# Patient Record
Sex: Female | Born: 1985
Health system: Southern US, Community
[De-identification: ages and names within clinical notes are randomized; demographics above are authoritative.]

## PROBLEM LIST (undated history)

## (undated) DIAGNOSIS — I499 Cardiac arrhythmia, unspecified: Secondary | ICD-10-CM

## (undated) DIAGNOSIS — C73 Malignant neoplasm of thyroid gland: Secondary | ICD-10-CM

## (undated) DIAGNOSIS — N181 Chronic kidney disease, stage 1: Secondary | ICD-10-CM

## (undated) DIAGNOSIS — E063 Autoimmune thyroiditis: Secondary | ICD-10-CM

## (undated) HISTORY — PX: WISDOM TOOTH EXTRACTION: SHX21

## (undated) HISTORY — DX: Chronic kidney disease, stage 1: N18.1

## (undated) HISTORY — DX: Autoimmune thyroiditis: E06.3

## (undated) HISTORY — DX: Malignant neoplasm of thyroid gland: C73

---
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
## 2003-06-02 ENCOUNTER — Other Ambulatory Visit: Admission: RE | Admit: 2003-06-02 | Discharge: 2003-06-02 | Payer: Self-pay | Admitting: Obstetrics and Gynecology

## 2004-08-02 ENCOUNTER — Ambulatory Visit: Payer: Self-pay | Admitting: Pulmonary Disease

## 2004-08-04 ENCOUNTER — Ambulatory Visit (HOSPITAL_COMMUNITY): Admission: RE | Admit: 2004-08-04 | Discharge: 2004-08-04 | Payer: Self-pay | Admitting: Pulmonary Disease

## 2004-08-14 ENCOUNTER — Other Ambulatory Visit: Admission: RE | Admit: 2004-08-14 | Discharge: 2004-08-14 | Payer: Self-pay | Admitting: Obstetrics and Gynecology

## 2004-08-14 ENCOUNTER — Ambulatory Visit: Payer: Self-pay | Admitting: Pulmonary Disease

## 2004-10-06 ENCOUNTER — Ambulatory Visit: Payer: Self-pay | Admitting: Pulmonary Disease

## 2004-12-18 ENCOUNTER — Ambulatory Visit: Payer: Self-pay | Admitting: Pulmonary Disease

## 2005-11-30 ENCOUNTER — Ambulatory Visit: Payer: Self-pay | Admitting: Pulmonary Disease

## 2006-04-29 ENCOUNTER — Ambulatory Visit: Payer: Self-pay | Admitting: Pulmonary Disease

## 2006-04-29 LAB — CONVERTED CEMR LAB
ALT: 37 units/L (ref 0–40)
AST: 25 units/L (ref 0–37)
Albumin: 4.1 g/dL (ref 3.5–5.2)
Alkaline Phosphatase: 56 units/L (ref 39–117)
BUN: 10 mg/dL (ref 6–23)
Basophils Absolute: 0 10*3/uL (ref 0.0–0.1)
Basophils Relative: 0.4 % (ref 0.0–1.0)
Bilirubin Urine: NEGATIVE
Bilirubin, Direct: 0.1 mg/dL (ref 0.0–0.3)
CO2: 30 meq/L (ref 19–32)
Calcium: 9.5 mg/dL (ref 8.4–10.5)
Chloride: 111 meq/L (ref 96–112)
Cholesterol: 152 mg/dL (ref 0–200)
Creatinine, Ser: 0.8 mg/dL (ref 0.4–1.2)
Crystals: NEGATIVE
Eosinophils Absolute: 0.1 10*3/uL (ref 0.0–0.6)
Eosinophils Relative: 1.5 % (ref 0.0–5.0)
Free T4: 0.6 ng/dL (ref 0.6–1.6)
GFR calc Af Amer: 115 mL/min
GFR calc non Af Amer: 95 mL/min
Glucose, Bld: 92 mg/dL (ref 70–99)
HCT: 42.8 % (ref 36.0–46.0)
HDL: 57.4 mg/dL (ref >39.0)
Hemoglobin, Urine: NEGATIVE
Hemoglobin: 14.8 g/dL (ref 12.0–15.0)
Ketones, ur: NEGATIVE mg/dL
LDL Cholesterol: 86 mg/dL (ref 0–99)
Lymphocytes Relative: 31 % (ref 12.0–46.0)
MCHC: 34.7 g/dL (ref 30.0–36.0)
MCV: 91 fL (ref 78.0–100.0)
Monocytes Absolute: 0.6 10*3/uL (ref 0.2–0.7)
Monocytes Relative: 10.9 % (ref 3.0–11.0)
Mucus, UA: NEGATIVE
Neutro Abs: 3 10*3/uL (ref 1.4–7.7)
Neutrophils Relative %: 56.2 % (ref 43.0–77.0)
Nitrite: NEGATIVE
Platelets: 228 10*3/uL (ref 150–400)
Potassium: 4.7 meq/L (ref 3.5–5.1)
RBC: 4.71 M/uL (ref 3.87–5.11)
RDW: 12.5 % (ref 11.5–14.6)
Sodium: 142 meq/L (ref 135–145)
Specific Gravity, Urine: 1.015 (ref 1.000–1.03)
TSH: 10.43 microintl units/mL — ABNORMAL HIGH (ref 0.35–5.50)
Total Bilirubin: 0.8 mg/dL (ref 0.3–1.2)
Total CHOL/HDL Ratio: 2.6
Total Protein: 6.8 g/dL (ref 6.0–8.3)
Triglycerides: 45 mg/dL (ref 0–149)
Urine Glucose: NEGATIVE mg/dL
Urobilinogen, UA: 0.2 (ref 0.0–1.0)
VLDL: 9 mg/dL (ref 0–40)
Varicella IgG: 1.9 — ABNORMAL HIGH
WBC: 5.3 10*3/uL (ref 4.5–10.5)
pH: 7 (ref 5.0–8.0)

## 2009-03-29 ENCOUNTER — Telehealth: Payer: Self-pay | Admitting: Pulmonary Disease

## 2009-05-02 ENCOUNTER — Ambulatory Visit: Payer: Self-pay | Admitting: Pulmonary Disease

## 2009-05-04 LAB — CONVERTED CEMR LAB
Albumin: 3.4 g/dL — ABNORMAL LOW (ref 3.5–5.2)
Alkaline Phosphatase: 40 units/L (ref 39–117)
Calcium: 9.1 mg/dL (ref 8.4–10.5)
Eosinophils Relative: 1.8 % (ref 0.0–5.0)
GFR calc non Af Amer: 107.81 mL/min (ref >60)
Glucose, Bld: 84 mg/dL (ref 70–99)
HCT: 36.9 % (ref 36.0–46.0)
Hemoglobin: 12.9 g/dL (ref 12.0–15.0)
LDL Cholesterol: 94 mg/dL (ref 0–99)
Lymphs Abs: 2.1 10*3/uL (ref 0.7–4.0)
MCV: 86.9 fL (ref 78.0–100.0)
Monocytes Relative: 9.4 % (ref 3.0–12.0)
Neutro Abs: 2.3 10*3/uL (ref 1.4–7.7)
RDW: 14.1 % (ref 11.5–14.6)
Sodium: 141 meq/L (ref 135–145)
TSH: 1.55 microintl units/mL (ref 0.35–5.50)
Total Bilirubin: 0.3 mg/dL (ref 0.3–1.2)
Total CHOL/HDL Ratio: 3
VLDL: 12.4 mg/dL (ref 0.0–40.0)
WBC: 5 10*3/uL (ref 4.5–10.5)

## 2009-07-25 ENCOUNTER — Telehealth: Payer: Self-pay | Admitting: Internal Medicine

## 2010-01-22 ENCOUNTER — Encounter: Payer: Self-pay | Admitting: Pulmonary Disease

## 2010-01-31 NOTE — Progress Notes (Signed)
Summary: call in doxy for uri/st - pt to call back w/ pharmacy  Phone Note Call from Patient Call back at [phone]   Caller: Patient Call For: NADEL Summary of Call: SORE THROAT CONGESTION AND RUNNY NOSE PHARMACY [facility] [address] Initial call taken by: Rickard Patience,  July 25, 2009 10:22 AM  Follow-up for Phone Call        called and spoke with pt---she stated that she is having cough/congestion with yellow/green sputum, runny nose.  NKDA--SN and TP out of the office.  please advise.  thanks Randell Loop CMA  July 25, 2009 11:13 AM  doxycycline 100 mg ac two times a day x 7 days Follow-up by: Nyoka Cowden MD,  July 25, 2009 12:52 PM  Additional Follow-up for Phone Call Additional follow up Details #1::        called spoke with patient.  advised of MW's recs as stated above.  pt verbalized her understanding.  emr does not recognize [facility] [address].  pt to call back with another identifier or pharmacy. Boone Master CNA/MA  July 25, 2009 12:56 PM     [facility] [address]   Additional Follow-up by: Eugene Gavia,  July 25, 2009 1:34 PM    Additional Follow-up for Phone Call Additional follow up Details #2::    [facility] [address] PER PTS REQUEST. Randell Loop CMA  July 25, 2009 1:48 PM   New/Updated Medications: DOXYCYCLINE HYCLATE 100 MG CAPS (DOXYCYCLINE HYCLATE) Take 1 capsule by mouth two times a day w/ meal x7days Prescriptions: DOXYCYCLINE HYCLATE 100 MG CAPS (DOXYCYCLINE HYCLATE) Take 1 capsule by mouth two times a day w/ meal x7days  #14 x 0   Entered by:   Randell Loop CMA   Authorized by:   Nyoka Cowden MD   Signed by:   Randell Loop CMA on 07/25/2009   Method used:   [facility]  [address] [phone][facility] (retail)       [address]       [place], [place]  [address]       Ph: [phone]       Fax: [phone]   RxID:   [id number]

## 2010-01-31 NOTE — Progress Notes (Signed)
Summary: rx for unithroid  Phone Note Call from Patient Call back at Work Phone [phone]   Caller: Patient Call For: Lauren Morton Reason for Call: Refill Medication, Talk to Nurse Summary of Call: pt has appt on 05/02/2009, she is out of Unithroid .  She needs a months supply called in until she can get in here on her appt. [facility] Initial call taken by: Eugene Gavia,  March 29, 2009 4:30 PM  Follow-up for Phone Call        nothing in EMR.  Requested chart.  Aundra Millet Reynolds LPN  March 29, 2009 4:47 PM   pt last saw Sn on 04/28/09 and was started on synthroid at that time. She has scheduled an appt to see SN on 05/02/09 and is requesting a refill on her current thyroid medication which is Unithroid .  Please advise if ok to send fill. I have chart if needed. Carron Curie CMA  March 30, 2009 9:20 AM   Additional Follow-up for Phone Call Additional follow up Details #1::        per SN---ok for fill the unithroid  #30  with 2 refills.  thanks Renaldo Fiddler CMA  March 30, 2009 3:12 PM   refill sent. pt aware. Carron Curie CMA  March 30, 2009 3:31 PM     New/Updated Medications: UNITHROID 125 MCG TABS (LEVOTHYROXINE SODIUM) Take 1 tablet by mouth once a day Prescriptions: UNITHROID 125 MCG TABS (LEVOTHYROXINE SODIUM) Take 1 tablet by mouth once a day  #30 x 2   Entered by:   Carron Curie CMA   Authorized by:   Michele Mcalpine MD   Signed by:   Carron Curie CMA on 03/30/2009   Method used:   [facility] [id number]       [address]       [place], [place]  [address]       Ph: [phone] or [phone]       Fax: [phone]   RxID:   [id number]

## 2010-01-31 NOTE — Assessment & Plan Note (Signed)
Summary: CPX/ MBW DR Lauren Morton OKAYED TO SEE PT   CC:  cpx--fasting today.  History of Present Illness: 25 y/o WF here for a follow up visit and CPX... she has a hx of Hashimoto's thyroiditis in 2006, and ultimately became hypothyroid requiring Synthroid replacenment & stable since then... she enjoys excellent general medical health & runs half-marathons... 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 she has no new complaints or concerns today...   Current Problems:   PHYSICAL EXAMINATION (ICD-V70.0)  HASHIMOTO'S THYROIDITIS (ICD-245.2) HYPOTHYROIDISM (ICD-244.9) - now stable on LEVOTHYROID 152mcg/d... diagnosed w/ Hashimoto's disease in 2006 w/ palp thyroid & TSH=6.14 (FreeT3 & FreeT4 were normal)... Sonar showed diffuse inhomogeneous echotexture & a single nodule  ~1.5cm right upper pole... Antithyroid antibodies were positive... f/u TSH's were normal thru 4/08 when routine testing showed TSH= 10.4 & she was started on LEVOTHYROID Rx...  ~  labs 5/11 showed TSH= 1.55, & exam w/ min palp gland, no nodule felt.    Allergies (verified): No Known Drug Allergies  Comments:  Nurse/Medical Assistant: The patient's medications and allergies were reviewed with the patient and were updated in the Medication and Allergy Lists.  Past History:  Past Medical History: HASHIMOTO'S THYROIDITIS (ICD-245.2) HYPOTHYROIDISM (ICD-244.9)  Family History: mother alive age 33  hx of breast cancer and arthritis father alive age 63 hx of DM grandfather with hx of heart disease  grandmother hx of breast cancer 1 sibling alive age 46  Social History: single no children never smoked exercises 3-4 times week caffeine use--1-2 cups daily alcohol use social  Review of Systems  The patient denies fever, chills, sweats, anorexia, fatigue, weakness, malaise, weight loss, sleep disorder, blurring, diplopia, eye irritation, eye  discharge, vision loss, eye pain, photophobia, earache, ear discharge, tinnitus, decreased hearing, nasal congestion, nosebleeds, sore throat, hoarseness, chest pain, palpitations, syncope, dyspnea on exertion, orthopnea, PND, peripheral edema, cough, dyspnea at rest, excessive sputum, hemoptysis, wheezing, pleurisy, nausea, vomiting, diarrhea, constipation, change in bowel habits, abdominal pain, melena, hematochezia, jaundice, gas/bloating, indigestion/heartburn, dysphagia, odynophagia, dysuria, hematuria, urinary frequency, urinary hesitancy, nocturia, incontinence, back pain, joint pain, joint swelling, muscle cramps, muscle weakness, stiffness, arthritis, sciatica, restless legs, leg pain at night, leg pain with exertion, rash, itching, dryness, suspicious lesions, paralysis, paresthesias, seizures, tremors, vertigo, transient blindness, frequent falls, frequent headaches, difficulty walking, depression, anxiety, memory loss, confusion, cold intolerance, heat intolerance, polydipsia, polyphagia, polyuria, unusual weight change, abnormal bruising, bleeding, enlarged lymph nodes, urticaria, allergic rash, hay fever, and recurrent infections.    Vital Signs:  Patient profile:   25 year old female Height:      71 inches Weight:      162 pounds BMI:     22.68 O2 Sat:      100 % on Room air Temp:     98.8 degrees F oral Pulse rate:   54 / minute BP sitting:   110 / 76  (right arm) Cuff size:   regular  Vitals Entered By: Lauren Morton CMA (May 02, 2009 9:14 AM)  O2 Sat at Rest %:  100 O2 Flow:  Room air CC: cpx--fasting today Is Patient Diabetic? No Pain Assessment Patient in pain? no      Comments MEDS UPDATED TODAY   Physical Exam  Additional Exam:  WD, tall/ slender, 25 y/o WF in NAD... GENERAL:  Alert & oriented; pleasant & cooperative... HEENT:  [place]/AT, EOM-wnl, PERRLA, Fundi-benign, EACs-clear, TMs-wnl,  NOSE-clear, THROAT-clear & wnl. NECK:  Supple w/ full ROM; no JVD; normal  carotid impulses w/o bruits; no thyromegaly or nodules palpated; no lymphadenopathy. CHEST:  Clear to P & A; without wheezes/ rales/ or rhonchi. HEART:  Regular Rhythm; without murmurs/ rubs/ or gallops. ABDOMEN:  Soft & nontender; normal bowel sounds; no organomegaly or masses detected. EXT: without deformities or arthritic changes; no varicose veins/ venous insuffic/ or edema. NEURO:  CN's intact; motor testing normal; sensory testing normal; gait normal & balance OK. DERM:  No lesions noted; no rash etc...     CXR  Procedure date:  05/02/2009  Findings:      CHEST - 2 VIEW Comparison: Chest x-ray of 04/29/2006   Findings: The lungs are clear.  Mediastinal contours are stable. The heart is within normal limits in size.  No bony abnormality is seen.   IMPRESSION: No active lung disease.   Read By:  Lauren Morton,  M.D.   EKG  Procedure date:  05/02/2009  Findings:      Sinus bradycardia with rate of:  56/ min... Tracing is WNL, NAD...  SN   MISC. Report  Procedure date:  05/02/2009  Findings:      BMP (METABOL)   Sodium                    141 mEq/L                   135-145   Potassium                 4.3 mEq/L                   3.5-5.1   Chloride                  107 mEq/L                   96-112   Carbon Dioxide            29 mEq/L                    19-32   Glucose                   84 mg/dL                    86-57   BUN                  [L]  5 mg/dL                     8-46   Creatinine                0.7 mg/dL                   9.6-2.9   Calcium                   9.1 mg/dL                   5.2-84.1   GFR                       107.81 mL/min               >60  Hepatic/Liver Function Panel (HEPATIC)   Total Bilirubin  0.3 mg/dL                   0.9-8.1   Direct Bilirubin          0.1 mg/dL                   1.9-1.4   Alkaline Phosphatase      40 U/L                      39-117   AST                       21 U/L                      0-37   ALT                        21 U/L                      0-35   Total Protein             6.1 g/dL                    7.8-2.9   Albumin              [L]  3.4 g/dL                    5.6-2.1  CBC Platelet w/Diff (CBCD)   White Cell Count          5.0 K/uL                    4.5-10.5   Red Cell Count            4.25 Mil/uL                 3.87-5.11   Hemoglobin                12.9 g/dL                   30.8-65.7   Hematocrit                36.9 %                      36.0-46.0   MCV                       86.9 fl                     78.0-100.0   Platelet Count            246.0 K/uL                  150.0-400.0   Neutrophil %              46.2 %                      43.0-77.0   Lymphocyte %              42.2 %                      12.0-46.0   Monocyte %  9.4 %                       3.0-12.0   Eosinophils%              1.8 %                       0.0-5.0   Basophils %               0.4 %                       0.0-3.0  Comments:      Lipid Panel (LIPID)   Cholesterol               165 mg/dL                   0-981   Triglycerides             62.0 mg/dL                  1.9-147.8   HDL                       29.56 mg/dL                 >21.30   LDL Cholesterol           94 mg/dL                    8-65  Tests: (5) TSH (TSH)   FastTSH                   1.55 uIU/mL                 0.35-5.50   Impression & Recommendations:  Problem # 1:  PHYSICAL EXAMINATION (ICD-V70.0) Good general health... Orders: 12 Lead EKG (12 Lead EKG) TLB-BMP (Basic Metabolic Panel-BMET) (80048-METABOL) TLB-Hepatic/Liver Function Pnl (80076-HEPATIC) TLB-CBC Platelet - w/Differential (85025-CBCD) TLB-Lipid Panel (80061-LIPID) TLB-TSH (Thyroid Stimulating Hormone) (84443-TSH)  Problem # 2:  HYPOTHYROIDISM (ICD-244.9) Stable on Levothy 125... continue same. Her updated medication list for this problem includes:    Unithroid 125 Mcg Tabs (Levothyroxine sodium) .Marland Kitchen... Take 1 tablet by mouth once a  day  Orders: Prescription Created Electronically [phone])  Complete Medication List: 1)  Unithroid 125 Mcg Tabs (Levothyroxine sodium) .... Take 1 tablet by mouth once a day 2)  Ortho-cyclen (28) 0.25-35 Mg-mcg Tabs (Norgestimate-eth estradiol) .... Take 1 tablet by mouth once a day 3)  Womens Multivitamin Plus Tabs (Multiple vitamins-minerals) .... Take 1 tab by mouth once daily...  Other Orders: T-2 View CXR (71020TC)  Patient Instructions: 1)  Today we updated your med list- see below.... 2)  We wrote new perscriptions for 2011... 3)  Today we did your follow up CXR, EKG, & Fasting blood work... please call the "phone tree" in a few days for your lab results.Marland KitchenMarland Kitchen 4)  Call for any problems... Prescriptions: UNITHROID 125 MCG TABS (LEVOTHYROXINE SODIUM) Take 1 tablet by mouth once a day  #100 x prn   Entered and Authorized by:   Michele Mcalpine MD   Signed by:   Michele Mcalpine MD on 05/02/2009   Method used:   Print then Give to Patient   RxID:   [id number]    CardioPerfect ECG  ID: [id number] Patient: Lauren Morton DOB: [birth year] Age: 25  Years Old Sex: Female Race: White Physician: Elesia Pemberton Technician: Lauren Morton CMA Height: 71 Weight: 162 Status: Unconfirmed Recorded: 05/02/2009 09:30 AM P/PR: 85 ms / 123 ms - Heart rate (maximum exercise) QRS: 85 QT/QTc/QTd: 429 ms / 420 ms / 75 ms - Heart rate (maximum exercise)  P/QRS/T axis: -15 deg / 73 deg / 45 deg - Heart rate (maximum exercise)  Heartrate: 55 bpm  Interpretation:  Sinus bradycardia with rate of:  56/ min... Tracing is WNL, NAD...  SN     Immunization History:  Influenza Immunization History:    Influenza:  historical (12/21/2008)

## 2010-05-19 NOTE — Assessment & Plan Note (Signed)
[facility]                             PULMONARY OFFICE NOTE   Lauren Morton, WRAGG                     MRN:          [id number]  DATE:11/30/2005                            DOB:          1985-06-03    HISTORY OF PRESENT ILLNESS:  The patient is a 25 year old white female  patient of Dr. Kriste Basque, who presents for an acute office visit.  The  patient complains of a one-week history of nasal congestion, postnasal  drip.  The patient denies any purulent sputum, fever, chest pain,  shortness of breath, recent travel, antibiotic use.  The patient also  complains she has had a knot along her right wrist for several months.  She denies any pain, extremity weakness or redness.   PAST MEDICAL HISTORY:  Reviewed.   CURRENT MEDICATIONS:  Reviewed.   PHYSICAL EXAMINATION:  The patient is a pleasant female, in no acute  distress.  She is afebrile with stable vital signs.  Her O2 saturation  is 100% on room air.  HEENT:  Nasal mucosa is pale, nontender sinuses to percussion,  congested, but not injected.  TM normal.  NECK:  The neck is supple without adenopathy.  No JVD.  LUNGS:  Lung sounds are clear.  CARDIAC:  Regular rate and rhythm.  ABDOMEN:  Soft, nontender.  EXTREMITIES:  Without any calf tenderness, cyanosis, clubbing or edema.  Along the dorsal aspect of the right hand at the bend of the wrist is a  small, mobile, soft nodule, consistent with a ganglion cyst.   IMPRESSION/PLAN:  1. Acute upper respiratory infection and rhinitis flare.  The patient      is to begin XYZAL daily times one week.  Samples given.  May begin      Veramyst 2 puffs twice daily.  The patient may also use Mucinex-DM      twice daily as needed.  The patient may return here if no      improvement or symptoms worsen.  2. Ganglion cyst.  The patient has been offered a referral to      orthopedist.  However, wants to      decline at present.  Has a followup with Dr. Kriste Basque in one  month,      will follow up accordingly, or sooner if needed.     Rubye Oaks, NP  Electronically Signed      Lonzo Cloud. Kriste Basque, MD  Electronically Signed   TP/MedQ  DD: 12/04/2005  DT: 12/05/2005  Job #: [id number]

## 2010-07-28 ENCOUNTER — Other Ambulatory Visit: Payer: Self-pay | Admitting: Pulmonary Disease

## 2010-09-18 ENCOUNTER — Other Ambulatory Visit: Payer: Self-pay | Admitting: Pulmonary Disease

## 2010-09-21 ENCOUNTER — Telehealth: Payer: Self-pay | Admitting: Pulmonary Disease

## 2010-09-21 MED ORDER — LEVOTHYROXINE SODIUM 125 MCG PO TABS: 125.0000 ug | ORAL_TABLET | Freq: Every day | ORAL | Status: DC

## 2010-09-21 NOTE — Telephone Encounter (Signed)
Pt is scheduled for f/u with Sn on 10/30 and knows that she must keep that appt for additional refills on her thyroid medication. We will send a 30 day supply w/ 1 additional refill to make sure she has enough medication until OV. Pt verbalized understanding of this.

## 2010-10-31 ENCOUNTER — Encounter: Payer: Self-pay | Admitting: Pulmonary Disease

## 2010-10-31 ENCOUNTER — Other Ambulatory Visit (INDEPENDENT_AMBULATORY_CARE_PROVIDER_SITE_OTHER): Payer: BC Managed Care – PPO

## 2010-10-31 ENCOUNTER — Ambulatory Visit (INDEPENDENT_AMBULATORY_CARE_PROVIDER_SITE_OTHER): Payer: BC Managed Care – PPO | Admitting: Pulmonary Disease

## 2010-10-31 DIAGNOSIS — E063 Autoimmune thyroiditis: Secondary | ICD-10-CM

## 2010-10-31 DIAGNOSIS — E039 Hypothyroidism, unspecified: Secondary | ICD-10-CM

## 2010-10-31 LAB — TSH: TSH: 0.84 u[IU]/mL (ref 0.35–5.50)

## 2010-10-31 MED ORDER — LEVOTHYROXINE SODIUM 125 MCG PO TABS: 125.0000 ug | ORAL_TABLET | Freq: Every day | ORAL | Status: DC

## 2010-10-31 NOTE — Patient Instructions (Addendum)
Today we updated your med list in our EPIC system...    Continue your current medications the same...  Today we did your follow up blood work...    Please call the PHONE TREE in a few days for your results...    Dial [id number] & when prompted enter your patient number followed by the # symbol...    Your patient number is:  [id number]  Call for any questions...  Let's plan a routine physical in about 1 year.Marland KitchenMarland Kitchen

## 2010-11-13 ENCOUNTER — Encounter: Payer: Self-pay | Admitting: Pulmonary Disease

## 2010-11-13 NOTE — Progress Notes (Signed)
Subjective:    Patient ID: Lauren Morton, female    DOB: 15-Nov-1985, 25 y.o.   MRN: [id number]  HPI 25 y/o WF here for a follow up visit... she has a hx of Hashimoto's thyroiditis in 2006, and ultimately became hypothyroid requiring Synthroid replacement & stable since then... she enjoys excellent general medical health & runs half-marathons... 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  ~  October 31, 2010:  25mo ROV & Lauren Morton continues to do well & has recently been married 09/30/10> Lauren Morton, she is in the process of name change etc... Feeling well, no new complaints or concerns, she remains clinically & biochem euthyroid on her 187mcg/d dose;  Refills for 90d supplies; she will get flu shot at her school.          Problem List:    PHYSICAL EXAMINATION (ICD-V70.0) - her GYN has her on Ortho-cyclen...  HASHIMOTO'S THYROIDITIS (ICD-245.2) HYPOTHYROIDISM (ICD-244.9) - now stable on LEVOTHYROID 193mcg/d... diagnosed w/ Hashimoto's disease in 2006 w/ palp thyroid & TSH=6.14 (FreeT3 & FreeT4 were normal)... Sonar showed diffuse inhomogeneous echotexture & a single nodule ~1.5cm right upper pole... Antithyroid antibodies were positive... f/u TSH's were normal thru 4/08 when routine testing showed TSH= 10.4 & she was started on LEVOTHYROID Rx... ~  Labs 5/11 showed TSH= 1.55, & exam w/ min palp gland, no nodule felt. ~  Labs 10/12 on Synthroid125 showed TSH= 0.84... Continue same med.   No past surgical history on file.   Outpatient Encounter Prescriptions as of 10/31/2010  Medication Sig Dispense Refill  . levothyroxine (SYNTHROID, LEVOTHROID) 125 MCG tablet Take 1 tablet (125 mcg total) by mouth daily.  90 tablet  3  . Multiple Vitamins-Minerals (WOMENS MULTIVITAMIN PLUS PO) Take 1 tablet by mouth daily.        . norgestimate-ethinyl estradiol (ORTHO-CYCLEN,SPRINTEC,PREVIFEM) 0.25-35 MG-MCG tablet Take 1  tablet by mouth daily.        Marland Kitchen DISCONTD: levothyroxine (SYNTHROID, LEVOTHROID) 125 MCG tablet Take 1 tablet (125 mcg total) by mouth daily.  30 tablet  1    No Known Allergies   Current Medications, Allergies, Past Medical History, Past Surgical History, Family History, and Social History were reviewed in [employer] record.    Review of Systems    The patient denies fever, chills, sweats, anorexia, fatigue, weakness, malaise, weight loss, sleep disorder, blurring, diplopia, eye irritation, eye discharge, vision loss, eye pain, photophobia, earache, ear discharge, tinnitus, decreased hearing, nasal congestion, nosebleeds, sore throat, hoarseness, chest pain, palpitations, syncope, dyspnea on exertion, orthopnea, PND, peripheral edema, cough, dyspnea at rest, excessive sputum, hemoptysis, wheezing, pleurisy, nausea, vomiting, diarrhea, constipation, change in bowel habits, abdominal pain, melena, hematochezia, jaundice, gas/bloating, indigestion/heartburn, dysphagia, odynophagia, dysuria, hematuria, urinary frequency, urinary hesitancy, nocturia, incontinence, back pain, joint pain, joint swelling, muscle cramps, muscle weakness, stiffness, arthritis, sciatica, restless legs, leg pain at night, leg pain with exertion, rash, itching, dryness, suspicious lesions, paralysis, paresthesias, seizures, tremors, vertigo, transient blindness, frequent falls, frequent headaches, difficulty walking, depression, anxiety, memory loss, confusion, cold intolerance, heat intolerance, polydipsia, polyphagia, polyuria, unusual weight change, abnormal bruising, bleeding, enlarged lymph nodes, urticaria, allergic rash, hay fever, and recurrent infections.     Objective:   Physical Exam     WD, tall/ slender, 25 y/o WF in NAD... GENERAL:  Alert & oriented; pleasant & cooperative... HEENT:  [place]/AT, EOM-wnl, PERRLA, Fundi-benign, EACs-clear, TMs-wnl, NOSE-clear, THROAT-clear & wnl.  NECK:  Supple  w/ full ROM; no JVD; normal carotid impulses w/o bruits; no thyromegaly or nodules palpated; no lymphadenopathy. CHEST:  Clear to P & A; without wheezes/ rales/ or rhonchi. HEART:  Regular Rhythm; without murmurs/ rubs/ or gallops. ABDOMEN:  Soft & nontender; normal bowel sounds; no organomegaly or masses detected. EXT: without deformities or arthritic changes; no varicose veins/ venous insuffic/ or edema. NEURO:  CN's intact; motor testing normal; sensory testing normal; gait normal & balance OK. DERM:  No lesions noted; no rash etc...   Assessment & Plan:   Hx Hashimoto's thyroiditis, Hypothyroidism>  Stable on Synthroid 193mcg/d; continue same...  Good general medical health.Marland KitchenMarland Kitchen

## 2010-11-17 ENCOUNTER — Other Ambulatory Visit: Payer: Self-pay | Admitting: Pulmonary Disease

## 2011-03-13 ENCOUNTER — Telehealth: Payer: Self-pay | Admitting: Pulmonary Disease

## 2011-03-13 MED ORDER — AMOXICILLIN-POT CLAVULANATE 875-125 MG PO TABS: 1.0000 | ORAL_TABLET | Freq: Two times a day (BID) | ORAL | Status: AC

## 2011-03-13 MED ORDER — PREDNISONE (PAK) 5 MG PO TABS: ORAL_TABLET | ORAL | Status: DC

## 2011-03-13 NOTE — Telephone Encounter (Signed)
Called, spoke with pt.  I informed her SN recs she take augmentin 1 po bid, pred dose pak - 6 day pack, mucinex 2 tabs bid, align, and increase fluids.  She verbalized understanding of these instructions and is [facility] [place].    Note: received warning when sending in augmention - it decreases the effect of ortho-cyclen.  I advised pt of this, and she verbalized understanding.  Nothing further needed at this time.

## 2011-03-13 NOTE — Telephone Encounter (Signed)
Per SN- prescribe augmentin 875, 1 po bid #14, pred dose pak 5mg , 6 day pack, take as directed #1 pack, Mucinex 2 tablets bid, align tablets as directed per bottle, and fluids. Carron Curie, CMA

## 2011-03-13 NOTE — Telephone Encounter (Signed)
Spoke with pt. She states that she has facial swelling and pressure x 6 days. She also has green nasal d/c. No cough or fever. She thinks that she has a sinus infection. Please advise, thanks! No Known Allergies

## 2011-05-14 ENCOUNTER — Encounter: Payer: Self-pay | Admitting: Family Medicine

## 2011-05-14 ENCOUNTER — Ambulatory Visit (INDEPENDENT_AMBULATORY_CARE_PROVIDER_SITE_OTHER): Payer: BC Managed Care – PPO | Admitting: Family Medicine

## 2011-05-14 VITALS — BP 126/82 | HR 68 | Temp 97.6°F | Ht 71.0 in | Wt 163.8 lb

## 2011-05-14 DIAGNOSIS — E039 Hypothyroidism, unspecified: Secondary | ICD-10-CM

## 2011-05-14 DIAGNOSIS — E063 Autoimmune thyroiditis: Secondary | ICD-10-CM

## 2011-05-14 NOTE — Patient Instructions (Addendum)
Check at work if you had Tdap (tetanus and pertussis) for date and let me know and I'll update your chart. Good to meet you today, call us with questions. Return as needed or in 2-3 years for physical (to check sugar and cholesterol levels).

## 2011-05-14 NOTE — Progress Notes (Signed)
Subjective:    Patient ID: Lauren Morton, female    DOB: [birth year], 26 y.o.   MRN: [id number]  HPI CC: new pt  Would like to establish with PCP.  Sees Dr. Kriste Basque (pulmonology) who follows her hypothyroidism.  H/o sinus infection several months back, now improved.  H/o hashimoto's thyroiditis.  Last checked TSH 10/2010.  Followed by Dr. Kriste Basque.  Thought was taking 110 mcg generic synthroid, will check at home. Lab Results  Component Value Date   TSH 0.84 10/31/2010   LMP 04/30/2011- pretty regular.  No concerns.  Preventative: Well woman - OBGYN 03/2011 (Dr. Arelia Sneddon) Tetanus 2003 Flu shot yearly (at work).  Medications and allergies reviewed and updated in chart.  Past histories reviewed and updated if relevant as below. Patient Active Problem List  Diagnoses  . HYPOTHYROIDISM  . HASHIMOTO'S THYROIDITIS   Past Medical History  Diagnosis Date  . Hashimoto's thyroiditis   . Hypothyroidism    No past surgical history on file. History  Substance Use Topics  . Smoking status: Never Smoker   . Smokeless tobacco: Never Used  . Alcohol Use: Yes     social use   Family History  Problem Relation Age of Onset  . Cancer Mother 59    breast cancer, s/p mastectomy  . Diabetes Father   . Coronary artery disease Maternal Grandfather     several MIs  . Stroke Neg Hx   . Cancer Maternal Grandmother     breast cancer s/p lumpectomy   No Known Allergies Current Outpatient Prescriptions on File Prior to Visit  Medication Sig Dispense Refill  . norgestimate-ethinyl estradiol (ORTHO-CYCLEN,SPRINTEC,PREVIFEM) 0.25-35 MG-MCG tablet Take 1 tablet by mouth daily.        . Multiple Vitamins-Minerals (WOMENS MULTIVITAMIN PLUS PO) Take 1 tablet by mouth daily.           Review of Systems  Constitutional: Negative for fever, chills, activity change, appetite change, fatigue and unexpected weight change.  HENT: Negative for hearing loss and neck pain.   Eyes: Negative for visual  disturbance.  Respiratory: Negative for cough, chest tightness, shortness of breath and wheezing.   Cardiovascular: Negative for chest pain, palpitations and leg swelling.  Gastrointestinal: Negative for nausea, vomiting, abdominal pain, diarrhea, constipation, blood in stool and abdominal distention.  Genitourinary: Negative for hematuria and difficulty urinating.  Musculoskeletal: Negative for myalgias and arthralgias.  Skin: Negative for rash.  Neurological: Negative for dizziness, seizures, syncope and headaches.  Hematological: Does not bruise/bleed easily.  Psychiatric/Behavioral: Negative for dysphoric mood. The patient is not nervous/anxious.        Objective:   Physical Exam  Nursing note and vitals reviewed. Constitutional: She is oriented to person, place, and time. She appears well-developed and well-nourished. No distress.  HENT:  Head: Normocephalic and atraumatic.  Right Ear: External ear normal.  Left Ear: External ear normal.  Nose: Nose normal.  Mouth/Throat: Oropharynx is clear and moist. No oropharyngeal exudate.  Eyes: Conjunctivae and EOM are normal. Pupils are equal, round, and reactive to light. No scleral icterus.  Neck: Normal range of motion. Neck supple. No thyromegaly present.  Cardiovascular: Normal rate, regular rhythm, normal heart sounds and intact distal pulses.   No murmur heard. Pulses:      Radial pulses are 2+ on the right side, and 2+ on the left side.  Pulmonary/Chest: Effort normal and breath sounds normal. No respiratory distress. She has no wheezes. She has no rales.  Abdominal: Soft. Bowel sounds are normal.  She exhibits no distension and no mass. There is no tenderness. There is no rebound and no guarding.  Musculoskeletal: Normal range of motion. She exhibits no edema.  Lymphadenopathy:    She has no cervical adenopathy.  Neurological: She is alert and oriented to person, place, and time.       CN grossly intact, station and gait intact   Skin: Skin is warm and dry. No rash noted.  Psychiatric: She has a normal mood and affect. Her behavior is normal. Judgment and thought content normal.      Assessment & Plan:  Will check date of last Tdap and call us to update chart, thinks 1-2 yrs ago.

## 2011-05-14 NOTE — Assessment & Plan Note (Signed)
Stable, last checked 10/2010. States followed by Dr. Kriste Basque.

## 2011-11-17 ENCOUNTER — Other Ambulatory Visit: Payer: Self-pay | Admitting: Pulmonary Disease

## 2011-11-19 ENCOUNTER — Other Ambulatory Visit: Payer: Self-pay | Admitting: Family Medicine

## 2011-11-19 NOTE — Telephone Encounter (Signed)
Ok to refill? Chart says Dr. Kriste Basque normally refilled med. No recent labs.

## 2011-11-19 NOTE — Telephone Encounter (Signed)
Done

## 2012-01-07 ENCOUNTER — Telehealth: Payer: Self-pay | Admitting: Pulmonary Disease

## 2012-01-07 MED ORDER — AMOXICILLIN-POT CLAVULANATE 875-125 MG PO TABS: 1.0000 | ORAL_TABLET | Freq: Two times a day (BID) | ORAL | Status: DC

## 2012-01-07 NOTE — Telephone Encounter (Signed)
Per SN---if no allergies----ok to call in augmentin 875 mg  #14  1 po bid, mucinex 2 po bid, increase fluids, nasal saline.  thanks

## 2012-01-07 NOTE — Telephone Encounter (Addendum)
Pt states that she is congested in her sinuses. She is coughing with production of yellowish/green mucus. Onset was 1 week ago.  Please advise.  No Known Allergies

## 2012-01-07 NOTE — Telephone Encounter (Signed)
I spoke with pt and is aware of SN recs. She voiced her understanding and rx has been sent to the pharmacy. Nothing further was needed

## 2012-01-22 ENCOUNTER — Other Ambulatory Visit: Payer: Self-pay | Admitting: Pulmonary Disease

## 2012-02-18 ENCOUNTER — Ambulatory Visit: Payer: BC Managed Care – PPO | Admitting: Pulmonary Disease

## 2012-03-25 ENCOUNTER — Other Ambulatory Visit (INDEPENDENT_AMBULATORY_CARE_PROVIDER_SITE_OTHER): Payer: BC Managed Care – PPO

## 2012-03-25 ENCOUNTER — Encounter: Payer: Self-pay | Admitting: Pulmonary Disease

## 2012-03-25 ENCOUNTER — Ambulatory Visit (INDEPENDENT_AMBULATORY_CARE_PROVIDER_SITE_OTHER): Payer: BC Managed Care – PPO | Admitting: Pulmonary Disease

## 2012-03-25 VITALS — BP 116/76 | HR 75 | Temp 98.0°F | Ht 71.0 in | Wt 165.8 lb

## 2012-03-25 DIAGNOSIS — E063 Autoimmune thyroiditis: Secondary | ICD-10-CM

## 2012-03-25 DIAGNOSIS — E039 Hypothyroidism, unspecified: Secondary | ICD-10-CM

## 2012-03-25 MED ORDER — LEVOTHYROXINE SODIUM 125 MCG PO TABS: 125.0000 ug | ORAL_TABLET | Freq: Every day | ORAL | Status: DC

## 2012-03-25 NOTE — Progress Notes (Signed)
Subjective:    Patient ID: Lauren Morton, female    DOB: 03-23-1985, 27 y.o.   MRN: [id number]  HPI 27 y/o WF here for a follow up visit... she has a hx of Hashimoto's thyroiditis in 2006, and ultimately became hypothyroid requiring Synthroid replacement & stable since then... she enjoys excellent general medical health & runs half-marathons... 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  ~  October 31, 2010:  24mo ROV & Rey continues to do well & has recently been married 09/30/10> Lauren Morton, she is in the process of name change etc... Feeling well, no new complaints or concerns, she remains clinically & biochem euthyroid on her 14mcg/d dose;  Refills for 90d supplies; she will get flu shot at her school.  ~  March 25, 2012:  75mo ROV & Lauren Morton continues to do well overall; her CC is some constipation & we discussed Rx w/ Miralax, Senakot-S, etc... She continues on Levothyroid 146mcg/d & is both clinically & biochem euthyroid w/ TSH= 1.25 today... Her only other med is BCP from her GYN... Feeling well, good energy, still runs etc...      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           Problem List:    PHYSICAL EXAMINATION (ICD-V70.0) - her GYN has her on Ortho-cyclen...  HASHIMOTO'S THYROIDITIS (ICD-245.2) HYPOTHYROIDISM (ICD-244.9) - now stable on LEVOTHYROID 198mcg/d... diagnosed w/ Hashimoto's disease in 2006 w/ palp thyroid & TSH=6.14 (FreeT3 & FreeT4 were normal)... Sonar showed diffuse inhomogeneous echotexture & a single nodule ~1.5cm right upper pole... Antithyroid antibodies were positive... f/u TSH's were normal thru 4/08 when routine testing showed TSH= 10.4 & she was started on LEVOTHYROID Rx... ~  Labs 5/11 showed TSH= 1.55, & exam w/ min palp gland, no  nodule felt. ~  Labs 10/12 on Synthroid125 showed TSH= 0.84... Continue same med. ~  Labs 3/14 on Synthroid125 showed TSH= 1.25   History reviewed. No pertinent past surgical history.   Outpatient Encounter Prescriptions as of 03/25/2012  Medication Sig Dispense Refill  . levothyroxine (SYNTHROID, LEVOTHROID) 125 MCG tablet TAKE 1 TABLET (125 MCG TOTAL) BY MOUTH DAILY.  90 tablet  3  . norgestimate-ethinyl estradiol (ORTHO-CYCLEN,SPRINTEC,PREVIFEM) 0.25-35 MG-MCG tablet Take 1 tablet by mouth daily.        . [DISCONTINUED] amoxicillin-clavulanate (AUGMENTIN) 875-125 MG per tablet Take 1 tablet by mouth 2 (two) times daily.  14 tablet  0  . [DISCONTINUED] levothyroxine (SYNTHROID, LEVOTHROID) 125 MCG tablet TAKE 1 TABLET (125 MCG TOTAL) BY MOUTH DAILY.  90 tablet  0   No facility-administered encounter medications on file as of 03/25/2012.    No Known Allergies   Current Medications, Allergies, Past Medical History, Past Surgical History, Family History, and Social History were reviewed in [employer] record.    Review of Systems    The patient denies fever, chills, sweats, anorexia, fatigue, weakness, malaise, weight loss, sleep disorder, blurring, diplopia, eye irritation, eye discharge, vision loss, eye pain, photophobia, earache, ear discharge, tinnitus, decreased hearing, nasal congestion, nosebleeds, sore throat, hoarseness, chest pain, palpitations, syncope, dyspnea on exertion, orthopnea, PND, peripheral edema, cough, dyspnea at rest, excessive sputum, hemoptysis, wheezing, pleurisy, nausea, vomiting, diarrhea, constipation, change in bowel habits, abdominal  pain, melena, hematochezia, jaundice, gas/bloating, indigestion/heartburn, dysphagia, odynophagia, dysuria, hematuria, urinary frequency, urinary hesitancy, nocturia, incontinence, back pain, joint pain, joint swelling, muscle cramps, muscle weakness, stiffness, arthritis, sciatica, restless legs, leg pain at  night, leg pain with exertion, rash, itching, dryness, suspicious lesions, paralysis, paresthesias, seizures, tremors, vertigo, transient blindness, frequent falls, frequent headaches, difficulty walking, depression, anxiety, memory loss, confusion, cold intolerance, heat intolerance, polydipsia, polyphagia, polyuria, unusual weight change, abnormal bruising, bleeding, enlarged lymph nodes, urticaria, allergic rash, hay fever, and recurrent infections.     Objective:   Physical Exam     WD, tall/ slender, 27 y/o WF in NAD... GENERAL:  Alert & oriented; pleasant & cooperative... HEENT:  [place]/AT, EOM-wnl, PERRLA, Fundi-benign, EACs-clear, TMs-wnl, NOSE-clear, THROAT-clear & wnl. NECK:  Supple w/ full ROM; no JVD; normal carotid impulses w/o bruits; no thyromegaly or nodules palpated; no lymphadenopathy. CHEST:  Clear to P & A; without wheezes/ rales/ or rhonchi. HEART:  Regular Rhythm; without murmurs/ rubs/ or gallops. ABDOMEN:  Soft & nontender; normal bowel sounds; no organomegaly or masses detected. EXT: without deformities or arthritic changes; no varicose veins/ venous insuffic/ or edema. NEURO:  CN's intact; motor testing normal; sensory testing normal; gait normal & balance OK. DERM:  No lesions noted; no rash etc...   Assessment & Plan:    Hx Hashimoto's thyroiditis, Hypothyroidism>  Stable on Synthroid 159mcg/d; continue same...  Good general medical health...   Patient's Medications  New Prescriptions   No medications on file  Previous Medications   NORGESTIMATE-ETHINYL ESTRADIOL (ORTHO-CYCLEN,SPRINTEC,PREVIFEM) 0.25-35 MG-MCG TABLET    Take 1 tablet by mouth daily.    Modified Medications   Modified Medication Previous Medication   LEVOTHYROXINE (SYNTHROID, LEVOTHROID) 125 MCG TABLET levothyroxine (SYNTHROID, LEVOTHROID) 125 MCG tablet      TAKE 1 TABLET BY MOUTH EVERY DAY    TAKE 1 TABLET (125 MCG TOTAL) BY MOUTH DAILY.  Discontinued Medications   AMOXICILLIN-CLAVULANATE  (AUGMENTIN) 875-125 MG PER TABLET    Take 1 tablet by mouth 2 (two) times daily.   LEVOTHYROXINE (SYNTHROID, LEVOTHROID) 125 MCG TABLET    TAKE 1 TABLET (125 MCG TOTAL) BY MOUTH DAILY.

## 2012-03-25 NOTE — Patient Instructions (Addendum)
Today we updated your med list in our EPIC system...    Continue your current medications the same...    We refilled your med today...  Today we did your follow up TSH (Thyroid blood test)...    We will contact you w/ the results when available...   Call for any questions or if we can be of service in any way.Marland KitchenMarland Kitchen

## 2012-04-27 ENCOUNTER — Other Ambulatory Visit: Payer: Self-pay | Admitting: Pulmonary Disease

## 2012-10-31 ENCOUNTER — Telehealth: Payer: Self-pay | Admitting: Pulmonary Disease

## 2012-10-31 MED ORDER — AMOXICILLIN-POT CLAVULANATE 875-125 MG PO TABS: 1.0000 | ORAL_TABLET | Freq: Two times a day (BID) | ORAL | Status: DC

## 2012-10-31 NOTE — Telephone Encounter (Signed)
Per SN---  augmentin 875 mg #14  1 po bid Align once daily mucinex 2 po bid with increase in fluids Nasal saline mist every 1-2 hours as needed  Called and spoke with pt and she is aware of SN recs and of meds sent to the pharmacy

## 2012-10-31 NOTE — Telephone Encounter (Signed)
Last OV 03-25-12. Pt c/o having sinus congestion, pain and pressure, PND and dry cough x 1.5 weeks. Pt states she gets this once a year every year at this time and is requesting an Rx be called in for abx. Please advise. Carron Curie, CMA No Known Allergies

## 2013-02-09 ENCOUNTER — Telehealth: Payer: Self-pay | Admitting: Pulmonary Disease

## 2013-02-09 MED ORDER — AMOXICILLIN-POT CLAVULANATE 875-125 MG PO TABS: 1.0000 | ORAL_TABLET | Freq: Two times a day (BID) | ORAL | Status: DC

## 2013-02-09 NOTE — Telephone Encounter (Signed)
Last OV 03-25-12, next OV 03/24/13 . Pt is c/o having sinus congestion, pain and pressure x 1.5 weeks. She denies any fever, body aches, chills, PND. Pt states she gets same symptoms every year. Please advise.  Bing, CMA No Known Allergies

## 2013-02-09 NOTE — Telephone Encounter (Signed)
Per SN---  augmentin 875 mg #14  1 po bid Align once daily otc nasal saline spray every 1-2 hours while awake mucinex 600 mg  2 po bid  Increase fluids  Called and spoke with pt and she is aware of SN recs and nothing further is needed.

## 2013-03-24 ENCOUNTER — Ambulatory Visit (INDEPENDENT_AMBULATORY_CARE_PROVIDER_SITE_OTHER): Payer: BC Managed Care – PPO | Admitting: Pulmonary Disease

## 2013-03-24 ENCOUNTER — Encounter: Payer: Self-pay | Admitting: Pulmonary Disease

## 2013-03-24 VITALS — BP 110/76 | HR 65 | Temp 97.9°F | Ht 71.0 in | Wt 167.4 lb

## 2013-03-24 DIAGNOSIS — Z8639 Personal history of other endocrine, nutritional and metabolic disease: Secondary | ICD-10-CM

## 2013-03-24 DIAGNOSIS — Z862 Personal history of diseases of the blood and blood-forming organs and certain disorders involving the immune mechanism: Secondary | ICD-10-CM

## 2013-03-24 DIAGNOSIS — E039 Hypothyroidism, unspecified: Secondary | ICD-10-CM

## 2013-03-24 MED ORDER — FLUOCINONIDE-E 0.05 % EX CREA: TOPICAL_CREAM | CUTANEOUS | Status: DC

## 2013-03-24 MED ORDER — LEVOTHYROXINE SODIUM 125 MCG PO TABS: ORAL_TABLET | ORAL | Status: DC

## 2013-03-24 NOTE — Progress Notes (Signed)
Subjective:    Patient ID: Lauren Morton, female    DOB: 03/25/1985, 28 y.o.   MRN: [id number]  HPI 28 y/o WF here for a follow up visit... she has a hx of Hashimoto's thyroiditis in 2006, and ultimately became hypothyroid requiring Synthroid replacement & stable since then... she enjoys excellent general medical health & runs half-marathons... 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  ~  October 31, 2010:  24mo ROV & Lauren Morton continues to do well & has recently been married 09/30/10> Lauren Morton, she is in the process of name change etc... 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  ~  March 25, 2012:  30mo ROV & Lauren Morton continues to do well overall; her CC is some constipation & we discussed Rx w/ Miralax, Senakot-S, etc... She continues on Levothyroid 141mcg/d & is both clinically & biochem euthyroid w/ TSH= 1.25 today... Her only other med is BCP from her GYN... Feeling well, good energy, still runs etc...      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  ~  March 24, 2013:  Yearly ROV & Lauren Morton has had a good yr- no new complaints or concerns; stable on her Levothyrod177mcg/d and BCP from her Gyn... She ran a 1/2 marathon in [place] 11/14 in about 2.5 hrs... ROS is essentially neg; she is reminded to get the yearly Flu vaccine each Fall... We reviewed prob list, meds, xrays and labs> see below for updates >>  LABS 3/15:  She did not go to the lab for the planned TSH recheck today...           Problem List:    PHYSICAL EXAMINATION (ICD-V70.0) - her GYN has her on Ortho-cyclen...  HASHIMOTO'S THYROIDITIS (ICD-245.2) HYPOTHYROIDISM (ICD-244.9) - now  stable on LEVOTHYROID 118mcg/d... diagnosed w/ Hashimoto's disease in 2006 w/ palp thyroid & TSH=6.14 (FreeT3 & FreeT4 were normal)... Sonar showed diffuse inhomogeneous echotexture & a single nodule ~1.5cm right upper pole... Antithyroid antibodies were positive... f/u TSH's were normal thru 4/08 when routine testing showed TSH= 10.4 & she was started on LEVOTHYROID Rx... ~  Labs 5/11 showed TSH= 1.55, & exam w/ min palp gland, no nodule felt. ~  Labs 10/12 on Synthroid125 showed TSH= 0.84... Continue same med. ~  Labs 3/14 on Synthroid125 showed TSH= 1.25 ~  Labs 3/15 on Synthroid125 => she forgot to go to the lab for this blood test...   History reviewed. No pertinent past surgical history.   Outpatient Encounter Prescriptions as of 03/24/2013  Medication Sig  . levothyroxine (SYNTHROID, LEVOTHROID) 125 MCG tablet TAKE 1 TABLET BY MOUTH EVERY DAY  . norgestimate-ethinyl estradiol (ORTHO-CYCLEN,SPRINTEC,PREVIFEM) 0.25-35 MG-MCG tablet Take 1 tablet by mouth daily.    . [DISCONTINUED] amoxicillin-clavulanate (AUGMENTIN) 875-125 MG per tablet Take 1 tablet by mouth 2 (two) times daily.    No Known Allergies   Current Medications, Allergies, Past Medical History, Past Surgical History, Family History, and Social History were reviewed in [employer] record.    Review of Systems    The patient denies fever, chills, sweats, anorexia, fatigue, weakness, malaise, weight loss, sleep  disorder, blurring, diplopia, eye irritation, eye discharge, vision loss, eye pain, photophobia, earache, ear discharge, tinnitus, decreased hearing, nasal congestion, nosebleeds, sore throat, hoarseness, chest pain, palpitations, syncope, dyspnea on exertion, orthopnea, PND, peripheral edema, cough, dyspnea at rest, excessive sputum, hemoptysis, wheezing, pleurisy, nausea, vomiting, diarrhea, constipation, change in bowel habits, abdominal pain, melena, hematochezia, jaundice, gas/bloating,  indigestion/heartburn, dysphagia, odynophagia, dysuria, hematuria, urinary frequency, urinary hesitancy, nocturia, incontinence, back pain, joint pain, joint swelling, muscle cramps, muscle weakness, stiffness, arthritis, sciatica, restless legs, leg pain at night, leg pain with exertion, rash, itching, dryness, suspicious lesions, paralysis, paresthesias, seizures, tremors, vertigo, transient blindness, frequent falls, frequent headaches, difficulty walking, depression, anxiety, memory loss, confusion, cold intolerance, heat intolerance, polydipsia, polyphagia, polyuria, unusual weight change, abnormal bruising, bleeding, enlarged lymph nodes, urticaria, allergic rash, hay fever, and recurrent infections.     Objective:   Physical Exam     WD, tall/ slender, 28 y/o WF in NAD... GENERAL:  Alert & oriented; pleasant & cooperative... HEENT:  Lauren Morton/AT, EOM-wnl, PERRLA, Fundi-benign, EACs-clear, TMs-wnl, NOSE-clear, THROAT-clear & wnl. NECK:  Supple w/ full ROM; no JVD; normal carotid impulses w/o bruits; no thyromegaly or nodules palpated; no lymphadenopathy. CHEST:  Clear to P & A; without wheezes/ rales/ or rhonchi. HEART:  Regular Rhythm; without murmurs/ rubs/ or gallops. ABDOMEN:  Soft & nontender; normal bowel sounds; no organomegaly or masses detected. EXT: without deformities or arthritic changes; no varicose veins/ venous insuffic/ or edema. NEURO:  CN's intact; motor testing normal; sensory testing normal; gait normal & balance OK. DERM:  No lesions noted; no rash etc...   Assessment & Plan:    Hx Hashimoto's thyroiditis, Hypothyroidism>  Stable on Synthroid 158mcg/d; continue same...  Good general medical health...   Patient's Medications  New Prescriptions   FLUOCINONIDE-EMOLLIENT (LIDEX-E) 0.05 % CREAM    Apply as directed  Previous Medications   NORGESTIMATE-ETHINYL ESTRADIOL (ORTHO-CYCLEN,SPRINTEC,PREVIFEM) 0.25-35 MG-MCG TABLET    Take 1 tablet by mouth daily.    Modified  Medications   Modified Medication Previous Medication   LEVOTHYROXINE (SYNTHROID, LEVOTHROID) 125 MCG TABLET levothyroxine (SYNTHROID, LEVOTHROID) 125 MCG tablet      TAKE 1 TABLET BY MOUTH EVERY DAY    TAKE 1 TABLET BY MOUTH EVERY DAY   LEVOTHYROXINE (SYNTHROID, LEVOTHROID) 125 MCG TABLET levothyroxine (SYNTHROID, LEVOTHROID) 125 MCG tablet      TAKE 1 TABLET BY MOUTH EVERY DAY    TAKE 1 TABLET (125 MCG TOTAL) BY MOUTH DAILY.  Discontinued Medications   AMOXICILLIN-CLAVULANATE (AUGMENTIN) 875-125 MG PER TABLET    Take 1 tablet by mouth 2 (two) times daily.

## 2013-03-24 NOTE — Patient Instructions (Signed)
Today we updated your med list in our EPIC system...    Continue your current medications the same...  Today we rechecked your Thyroid blood work...    We will contact you w/ the results when available...   Call for any questions or if we can be of service in any way.Marland KitchenMarland Kitchen

## 2013-04-26 ENCOUNTER — Other Ambulatory Visit: Payer: Self-pay | Admitting: Pulmonary Disease

## 2013-09-01 DIAGNOSIS — N181 Chronic kidney disease, stage 1: Secondary | ICD-10-CM

## 2013-09-01 HISTORY — DX: Chronic kidney disease, stage 1: N18.1

## 2013-09-16 ENCOUNTER — Encounter: Payer: Self-pay | Admitting: Family Medicine

## 2013-09-30 ENCOUNTER — Other Ambulatory Visit: Payer: Self-pay | Admitting: Pulmonary Disease

## 2013-11-30 ENCOUNTER — Telehealth: Payer: Self-pay | Admitting: Pulmonary Disease

## 2013-11-30 NOTE — Telephone Encounter (Signed)
Pt c/o sinusitis; head congestion-Kallan Bischoff mucus with sinus pressure. Has not established with a PCP yet, appt is in January 2016. [facility] [address] Lorina Rabon No Known Allergies  Please advise Dr Lenna Gilford. Thanks.  Current Outpatient Prescriptions on File Prior to Visit  Medication Sig Dispense Refill  . fluocinonide-emollient (LIDEX-E) 0.05 % cream Apply as directed 30 g 2  . levothyroxine (SYNTHROID, LEVOTHROID) 125 MCG tablet TAKE 1 TABLET BY MOUTH EVERY DAY 90 tablet 1  . levothyroxine (SYNTHROID, LEVOTHROID) 125 MCG tablet TAKE 1 TABLET BY MOUTH EVERY DAY 90 tablet 0  . norgestimate-ethinyl estradiol (ORTHO-CYCLEN,SPRINTEC,PREVIFEM) 0.25-35 MG-MCG tablet Take 1 tablet by mouth daily.       No current facility-administered medications on file prior to visit.

## 2013-11-30 NOTE — Telephone Encounter (Signed)
Per SN---  Call in augmentin 875 mg  #14  1 po bid Use nasal saline spray as needed Align once daily with the abx Prednisone dosepak  5 mg  6 day pack.   i called and lmomtcb for the pt to make her aware of SN recs.

## 2013-12-01 MED ORDER — PREDNISONE (PAK) 5 MG PO TABS: ORAL_TABLET | ORAL | Status: DC

## 2013-12-01 MED ORDER — AMOXICILLIN-POT CLAVULANATE 875-125 MG PO TABS: 1.0000 | ORAL_TABLET | Freq: Two times a day (BID) | ORAL | Status: DC

## 2013-12-01 NOTE — Telephone Encounter (Signed)
Spoke with pt and given Dr Jeannine Kitten recommendations.  Rx's sent to [facility] per pt request.

## 2013-12-28 ENCOUNTER — Other Ambulatory Visit: Payer: Self-pay | Admitting: Pulmonary Disease

## 2014-02-06 ENCOUNTER — Other Ambulatory Visit: Payer: Self-pay | Admitting: Pulmonary Disease

## 2014-03-31 ENCOUNTER — Encounter: Payer: Self-pay | Admitting: Family Medicine

## 2014-03-31 ENCOUNTER — Ambulatory Visit (INDEPENDENT_AMBULATORY_CARE_PROVIDER_SITE_OTHER): Payer: BC Managed Care – PPO | Admitting: Family Medicine

## 2014-03-31 VITALS — BP 108/80 | HR 56 | Temp 98.3°F | Ht 71.0 in | Wt 167.5 lb

## 2014-03-31 DIAGNOSIS — N181 Chronic kidney disease, stage 1: Secondary | ICD-10-CM | POA: Diagnosis not present

## 2014-03-31 DIAGNOSIS — C73 Malignant neoplasm of thyroid gland: Secondary | ICD-10-CM

## 2014-03-31 DIAGNOSIS — E041 Nontoxic single thyroid nodule: Secondary | ICD-10-CM

## 2014-03-31 DIAGNOSIS — E030 Congenital hypothyroidism with diffuse goiter: Secondary | ICD-10-CM | POA: Diagnosis not present

## 2014-03-31 DIAGNOSIS — Z0000 Encounter for general adult medical examination without abnormal findings: Secondary | ICD-10-CM

## 2014-03-31 HISTORY — DX: Malignant neoplasm of thyroid gland: C73

## 2014-03-31 LAB — BASIC METABOLIC PANEL
BUN: 8 mg/dL (ref 6–23)
CHLORIDE: 105 meq/L (ref 96–112)
CO2: 30 mEq/L (ref 19–32)
CREATININE: 0.68 mg/dL (ref 0.40–1.20)
Calcium: 9.3 mg/dL (ref 8.4–10.5)
GFR: 107.58 mL/min (ref >60.00)
Glucose, Bld: 89 mg/dL (ref 70–99)
Potassium: 4.4 mEq/L (ref 3.5–5.1)
Sodium: 137 mEq/L (ref 135–145)

## 2014-03-31 LAB — LIPID PANEL
CHOL/HDL RATIO: 3
Cholesterol: 188 mg/dL (ref 0–200)
HDL: 69.9 mg/dL (ref >39.00)
LDL CALC: 104 mg/dL — AB (ref 0–99)
NONHDL: 118.1
Triglycerides: 70 mg/dL (ref 0.0–149.0)
VLDL: 14 mg/dL (ref 0.0–40.0)

## 2014-03-31 LAB — T4, FREE: Free T4: 0.77 ng/dL (ref 0.60–1.60)

## 2014-03-31 LAB — TSH: TSH: 3.83 u[IU]/mL (ref 0.35–4.50)

## 2014-03-31 NOTE — Assessment & Plan Note (Addendum)
Check TSH today, then fill levothyroxine accordingly. R>L goiter noted on exam - recheck [place].

## 2014-03-31 NOTE — Assessment & Plan Note (Signed)
Reviewing records from last thyroid US in chart (2006) mention of R sided thyroid nodule >1cm.  On exam today again R thyroid nodule palpable - will check thyroid US, discussed possible biopsy if indicated. Pt denies h/o biopsy in past.

## 2014-03-31 NOTE — Assessment & Plan Note (Signed)
Preventative protocols reviewed and updated unless pt declined. Discussed healthy diet and lifestyle.  

## 2014-03-31 NOTE — Progress Notes (Signed)
Pre visit review using our clinic review tool, if applicable. No additional management support is needed unless otherwise documented below in the visit note. 

## 2014-03-31 NOTE — Assessment & Plan Note (Addendum)
Check Cr and FLP today.

## 2014-03-31 NOTE — Progress Notes (Signed)
BP 108/80 mmHg  Pulse 56  Temp(Src) 98.3 F (36.8 C) (Oral)  Ht 5\' 11"  (1.803 m)  Wt 167 lb 8 oz (75.978 kg)  BMI 23.37 kg/m2  LMP 03/25/2014   CC: CPE  Subjective:    Patient ID: Lauren Morton, female    DOB: 03-08-1985, 29 y.o.   MRN: [id number]  HPI: HERBERTA PICKRON is a 29 y.o. female presenting on 03/31/2014 for Annual Exam   Training for 1/2 marathon  Preventative: Well woman - OBGYN 12/2013 (Dr. Radene Knee) normal pap smear Tetanus 12/2008 Flu shot yearly (at work). 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 Interim medical history since our last visit reviewed. Allergies and medications reviewed and updated. Current Outpatient Prescriptions on File Prior to Visit  Medication Sig  . levothyroxine (SYNTHROID, LEVOTHROID) 125 MCG tablet TAKE 1 TABLET BY MOUTH EVERY DAY  . norgestimate-ethinyl estradiol (ORTHO-CYCLEN,SPRINTEC,PREVIFEM) 0.25-35 MG-MCG tablet Take 1 tablet by mouth daily.     No current facility-administered medications on file prior to visit.    Review of Systems  Constitutional: Negative for fever, chills, activity change, appetite change, fatigue and unexpected weight change.  HENT: Negative for hearing loss.   Eyes: Negative for visual disturbance.  Respiratory: Negative for cough, chest tightness, shortness of breath and wheezing.   Cardiovascular: Negative for chest pain, palpitations and leg swelling.  Gastrointestinal: Negative for nausea, vomiting, abdominal pain, diarrhea, constipation, blood in stool and abdominal distention.  Genitourinary: Negative for hematuria and difficulty  urinating.  Musculoskeletal: Negative for myalgias, arthralgias and neck pain.  Skin: Negative for rash.  Neurological: Negative for dizziness, seizures, syncope and headaches.  Hematological: Negative for adenopathy. Does not bruise/bleed easily.  Psychiatric/Behavioral: Negative for dysphoric mood. The patient is not nervous/anxious.    Per HPI unless specifically indicated above     Objective:    BP 108/80 mmHg  Pulse 56  Temp(Src) 98.3 F (36.8 C) (Oral)  Ht 5\' 11"  (1.803 m)  Wt 167 lb 8 oz (75.978 kg)  BMI 23.37 kg/m2  LMP 03/25/2014  Wt Readings from Last 3 Encounters:  03/31/14 167 lb 8 oz (75.978 kg)  03/24/13 167 lb 6.4 oz (75.932 kg)  03/25/12 165 lb 12.8 oz (75.206 kg)    Physical Exam  Constitutional: She is oriented to person, place, and time. She appears well-developed and well-nourished. No distress.  HENT:  Head: Normocephalic and atraumatic.  Right Ear: Hearing, tympanic membrane, external ear and ear canal normal.  Left Ear: Hearing, tympanic membrane, external ear and ear canal normal.  Nose: Nose normal.  Mouth/Throat: Uvula is midline, oropharynx is clear and moist and mucous membranes are normal. No oropharyngeal exudate, posterior oropharyngeal edema or posterior oropharyngeal erythema.  Eyes: Conjunctivae and EOM are normal. Pupils are equal, round, and reactive to light. No scleral icterus.  Neck: Normal range of motion. Neck supple. Thyromegaly (R sided with nodule) present.  Cardiovascular: Normal rate, regular rhythm, normal heart sounds and intact distal pulses.   No murmur heard. Pulses:      Radial pulses are 2+ on the right side, and 2+ on the left side.  Pulmonary/Chest: Effort normal and breath sounds  normal. No respiratory distress. She has no wheezes. She has no rales.  Abdominal: Soft. Bowel sounds are normal. She exhibits no distension and no mass. There is no tenderness. There is no rebound and no guarding.  Musculoskeletal: Normal range  of motion. She exhibits no edema.  Lymphadenopathy:    She has no cervical adenopathy.  Neurological: She is alert and oriented to person, place, and time.  CN grossly intact, station and gait intact  Skin: Skin is warm and dry. No rash noted.  Psychiatric: She has a normal mood and affect. Her behavior is normal. Judgment and thought content normal.  Nursing note and vitals reviewed.  Results for orders placed or performed in visit on 03/25/12  TSH  Result Value Ref Range   TSH 1.25 0.35 - 5.50 uIU/mL      Assessment & Plan:   Problem List Items Addressed This Visit    Thyroid nodule    Reviewing records from last thyroid US in chart (2006) mention of R sided thyroid nodule >1cm.  On exam today again R thyroid nodule palpable - will check thyroid US, discussed possible biopsy if indicated. Pt denies h/o biopsy in past.      Relevant Orders   US Soft Tissue Head/Neck   Hypothyroidism    Check TSH today, then fill levothyroxine accordingly. R>L goiter noted on exam - recheck [place].      Relevant Orders   TSH   Lipid panel   US Soft Tissue Head/Neck   Health maintenance examination - Primary    Preventative protocols reviewed and updated unless pt declined. Discussed healthy diet and lifestyle.       CKD (chronic kidney disease) stage 1, GFR 90 ml/min or greater    Check Cr and FLP today.      Relevant Orders   Lipid panel   Basic metabolic panel       Follow up plan: Return as needed, for annual exam, prior fasting for blood work.

## 2014-03-31 NOTE — Addendum Note (Signed)
Addended by: Ria Bush on: 03/31/2014 12:24 PM   Modules accepted: Orders, SmartSet

## 2014-03-31 NOTE — Patient Instructions (Addendum)
Blood work today. We will also schedule thyroid ultrasound over next few weeks. Good to see you today, call us with questions. Return as needed or in 1 year for next physical. Good luck with upcoming half marathon!

## 2014-04-01 ENCOUNTER — Other Ambulatory Visit: Payer: Self-pay | Admitting: Family Medicine

## 2014-04-01 ENCOUNTER — Encounter: Payer: Self-pay | Admitting: *Deleted

## 2014-04-01 MED ORDER — LEVOTHYROXINE SODIUM 125 MCG PO TABS: 125.0000 ug | ORAL_TABLET | Freq: Every day | ORAL | Status: DC

## 2014-04-06 ENCOUNTER — Ambulatory Visit
Admit: 2014-04-06 | Disposition: A | Discharge status: Home / Self Care | Payer: Self-pay | Attending: Family Medicine | Admitting: Family Medicine

## 2014-04-07 ENCOUNTER — Encounter: Payer: Self-pay | Admitting: Family Medicine

## 2014-04-08 ENCOUNTER — Other Ambulatory Visit: Payer: Self-pay | Admitting: Family Medicine

## 2014-04-08 DIAGNOSIS — E041 Nontoxic single thyroid nodule: Secondary | ICD-10-CM

## 2014-04-13 ENCOUNTER — Ambulatory Visit
Admit: 2014-04-13 | Disposition: A | Discharge status: Home / Self Care | Payer: Self-pay | Attending: Family Medicine | Admitting: Family Medicine

## 2014-04-13 ENCOUNTER — Encounter: Payer: Self-pay | Admitting: Family Medicine

## 2014-04-14 ENCOUNTER — Telehealth: Payer: Self-pay | Admitting: Family Medicine

## 2014-04-14 DIAGNOSIS — C73 Malignant neoplasm of thyroid gland: Secondary | ICD-10-CM

## 2014-04-14 NOTE — Telephone Encounter (Signed)
Called back, left message with my cell phone.

## 2014-04-14 NOTE — Telephone Encounter (Signed)
Dr.Siddiqui,Pathologist at [facility] and asked for Dr.Gutierrez to call her back about patient's Thyroid.

## 2014-04-15 NOTE — Telephone Encounter (Signed)
Again tried to call, left message on answering machine. Will try later today or tomorrow.

## 2014-04-15 NOTE — Telephone Encounter (Signed)
Again called, no answer. Will try again tomorrow.

## 2014-04-15 NOTE — Telephone Encounter (Signed)
Spoke with pathologist - papillary thyroid carcinoma on thyroid biopsy. Called to discuss with patient - no answer. Will try back later.

## 2014-04-16 NOTE — Telephone Encounter (Signed)
Called [facility] and spoke with Dr Gala Lewandowsky nurse Claiborne Billings, she has worked in the patient with Dr Harlow Asa on 04/28/14 at 11:30am. She will try to get her in sooner but right now this is the soonest and it is a work in spot. Patient is aware and she will pick up the disc and bring it to her consult.

## 2014-04-16 NOTE — Addendum Note (Signed)
Addended by: Ria Bush on: 04/16/2014 07:36 AM   Modules accepted: Orders

## 2014-04-16 NOTE — Telephone Encounter (Signed)
Spoke with patient. Discussed papillary thyroid cancer diagnosis. Will refer to surgery for further eval.

## 2014-04-20 ENCOUNTER — Telehealth: Payer: Self-pay

## 2014-04-20 NOTE — Telephone Encounter (Signed)
Left message on husband's cell. Spoke with Morey Hummingbird - she can't talk right now, but may be able to later tonight (after 8pm)

## 2014-04-20 NOTE — Telephone Encounter (Signed)
Spoke with husband, answered questions

## 2014-04-20 NOTE — Telephone Encounter (Signed)
Lauren Morton, pts husband left v/m;(Dustin is on DPR to talk with); pt has surgical consultation on 04/28/14 and Dustin request cb at [phone] from Dr Danise Mina to discuss pts testing results and diagnosis.

## 2014-04-26 ENCOUNTER — Encounter: Payer: Self-pay | Admitting: Family Medicine

## 2014-04-28 ENCOUNTER — Ambulatory Visit: Payer: Self-pay | Admitting: Surgery

## 2014-05-27 ENCOUNTER — Ambulatory Visit (HOSPITAL_COMMUNITY)
Admission: RE | Admit: 2014-05-27 | Discharge: 2014-05-27 | Disposition: A | Discharge status: Home / Self Care | Payer: BC Managed Care – PPO | Source: Ambulatory Visit | Attending: Anesthesiology | Admitting: Anesthesiology

## 2014-05-27 ENCOUNTER — Encounter (HOSPITAL_COMMUNITY): Payer: Self-pay

## 2014-05-27 ENCOUNTER — Encounter (HOSPITAL_COMMUNITY)
Admission: RE | Admit: 2014-05-27 | Discharge: 2014-05-27 | Disposition: A | Discharge status: Home / Self Care | Payer: BC Managed Care – PPO | Source: Ambulatory Visit | Attending: Surgery | Admitting: Surgery

## 2014-05-27 DIAGNOSIS — Z01818 Encounter for other preprocedural examination: Secondary | ICD-10-CM | POA: Insufficient documentation

## 2014-05-27 DIAGNOSIS — C73 Malignant neoplasm of thyroid gland: Secondary | ICD-10-CM | POA: Insufficient documentation

## 2014-05-27 DIAGNOSIS — E079 Disorder of thyroid, unspecified: Secondary | ICD-10-CM

## 2014-05-27 LAB — BASIC METABOLIC PANEL
Anion gap: 9 (ref 5–15)
BUN: 12 mg/dL (ref 6–20)
CHLORIDE: 107 mmol/L (ref 101–111)
CO2: 25 mmol/L (ref 22–32)
Calcium: 9.1 mg/dL (ref 8.9–10.3)
Creatinine, Ser: 0.76 mg/dL (ref 0.44–1.00)
GFR calc non Af Amer: >60 mL/min (ref >60)
GLUCOSE: 109 mg/dL — AB (ref 65–99)
Potassium: 4.7 mmol/L (ref 3.5–5.1)
SODIUM: 141 mmol/L (ref 135–145)

## 2014-05-27 LAB — CBC
HCT: 41.7 % (ref 36.0–46.0)
Hemoglobin: 13.6 g/dL (ref 12.0–15.0)
MCH: 29.6 pg (ref 26.0–34.0)
MCHC: 32.6 g/dL (ref 30.0–36.0)
MCV: 90.7 fL (ref 78.0–100.0)
PLATELETS: 255 10*3/uL (ref 150–400)
RBC: 4.6 MIL/uL (ref 3.87–5.11)
RDW: 13.1 % (ref 11.5–15.5)
WBC: 4.8 10*3/uL (ref 4.0–10.5)

## 2014-05-27 LAB — HCG, SERUM, QUALITATIVE: Preg, Serum: NEGATIVE

## 2014-05-27 NOTE — Patient Instructions (Signed)
20 JYA Lauren Morton  05/27/2014   Your procedure is scheduled on:      Thursday June 03, 2014   Report to [facility] Main Entrance and follow signs to  [facility] arrive at 5:30 AM.   Call this number if you have problems the morning of surgery [phone] or Presurgical Testing [phone].   Remember:  Do not eat food or drink liquids :After Midnight.  For Living Will and/or Health Care Power Attorney Forms: please provide copy for your medical record, may bring AM of surgery (forms should be already notarized-we do not provide this service).   Take these medicines the morning of surgery with A SIP OF WATER: Levothyroxine                               You may not have any metal on your body including hair pins and piercings  Do not wear jewelry, perfumes, nail polish, make-up, lotions, powders, or deodorant.  Do not shave body hair  48 hours(2 days) of CHG soap use.               Do not bring valuables to the hospital. [place].  Contacts, dentures or bridgework may not be worn into surgery.  Leave suitcase in the car. After surgery it may be brought to your room.  For patients admitted to the hospital, checkout time is 11:00 AM the day of discharge.   ONLY ONE PERSON WITH YOU IN SHORT STAY MORNING OF SURGERY, IF TIME PERMITS AFTER YOU ARE READY, OTHERS MAY VISIT. NO MORE THAN 2 PERSONS PER ROOM AT A TIME.  ________________________________________________________________________  [facility] - Preparing for Surgery Before surgery, you can play an important role.  Because skin is not sterile, your skin needs to be as free of germs as possible.  You can reduce the number of germs on your skin by washing with CHG (chlorahexidine gluconate) soap before surgery.  CHG is an antiseptic cleaner which kills germs and bonds with the skin to continue killing germs even after washing. Please DO NOT use if you have an allergy to CHG or antibacterial  soaps.  If your skin becomes reddened/irritated stop using the CHG and inform your nurse when you arrive at Short Stay. Do not shave (including legs and underarms) for at least 48 hours prior to the first CHG shower.  You may shave your face/neck. Please follow these instructions carefully:  1.  Shower with CHG Soap the night before surgery and the  morning of Surgery.  2.  If you choose to wash your hair, wash your hair first as usual with your  normal  shampoo.  3.  After you shampoo, rinse your hair and body thoroughly to remove the  shampoo.                           4.  Use CHG as you would any other liquid soap.  You can apply chg directly  to the skin and wash                       Gently with a scrungie or clean washcloth.  5.  Apply the CHG Soap to your body ONLY FROM THE NECK DOWN.   Do not use on face/ open  Wound or open sores. Avoid contact with eyes, ears mouth and genitals (private parts).                       Wash face,  Genitals (private parts) with your normal soap.             6.  Wash thoroughly, paying special attention to the area where your surgery  will be performed.  7.  Thoroughly rinse your body with warm water from the neck down.  8.  DO NOT shower/wash with your normal soap after using and rinsing off  the CHG Soap.                9.  Pat yourself dry with a clean towel.            10.  Wear clean pajamas.            11.  Place clean sheets on your bed the night of your first shower and do not  sleep with pets. Day of Surgery : Do not apply any lotions/deodorants the morning of surgery.  Please wear clean clothes to the hospital/surgery center.  FAILURE TO FOLLOW THESE INSTRUCTIONS MAY RESULT IN THE CANCELLATION OF YOUR SURGERY PATIENT SIGNATURE_________________________________  NURSE SIGNATURE__________________________________  ________________________________________________________________________

## 2014-05-27 NOTE — Progress Notes (Signed)
Quick Note:  These results are acceptable for scheduled surgery.  Lynx Goodrich M. Lakyn Mantione, MD, FACS [facility] Office: [phone]   ______ 

## 2014-05-27 NOTE — Progress Notes (Signed)
Quick Note:  These results are acceptable for scheduled surgery.  Shi Grose M. Lanier Millon, MD, FACS [facility] Office: [phone]   ______ 

## 2014-05-27 NOTE — Progress Notes (Signed)
LOV note epic 03/24/2013 Dr Lenna Gilford

## 2014-05-27 NOTE — Progress Notes (Signed)
Quick Note:  Pre-operative chest x-ray is acceptable for scheduled surgery.  Harrol Novello M. Knox Cervi, MD, FACS [facility] Office: [phone]   ______ 

## 2014-06-02 HISTORY — PX: TOTAL THYROIDECTOMY: SHX2547

## 2014-06-03 ENCOUNTER — Encounter (HOSPITAL_COMMUNITY): Payer: Self-pay | Admitting: *Deleted

## 2014-06-03 ENCOUNTER — Observation Stay (HOSPITAL_COMMUNITY)
Admission: RE | Admit: 2014-06-03 | Discharge: 2014-06-04 | Disposition: A | Discharge status: Home / Self Care | Payer: BC Managed Care – PPO | Source: Ambulatory Visit | Attending: Surgery | Admitting: Surgery

## 2014-06-03 ENCOUNTER — Encounter (HOSPITAL_COMMUNITY)
Admission: RE | Disposition: A | Discharge status: Home / Self Care | Payer: Self-pay | Source: Ambulatory Visit | Attending: Surgery

## 2014-06-03 ENCOUNTER — Ambulatory Visit (HOSPITAL_COMMUNITY): Payer: BC Managed Care – PPO | Admitting: Certified Registered Nurse Anesthetist

## 2014-06-03 DIAGNOSIS — Z793 Long term (current) use of hormonal contraceptives: Secondary | ICD-10-CM | POA: Insufficient documentation

## 2014-06-03 DIAGNOSIS — Z79899 Other long term (current) drug therapy: Secondary | ICD-10-CM | POA: Insufficient documentation

## 2014-06-03 DIAGNOSIS — C73 Malignant neoplasm of thyroid gland: Secondary | ICD-10-CM | POA: Diagnosis not present

## 2014-06-03 DIAGNOSIS — E063 Autoimmune thyroiditis: Secondary | ICD-10-CM | POA: Diagnosis not present

## 2014-06-03 DIAGNOSIS — E039 Hypothyroidism, unspecified: Secondary | ICD-10-CM | POA: Diagnosis not present

## 2014-06-03 HISTORY — PX: THYROIDECTOMY: SHX17

## 2014-06-03 HISTORY — PX: LYMPH NODE DISSECTION: SHX5087

## 2014-06-03 SURGERY — THYROIDECTOMY
Anesthesia: General | Site: Neck

## 2014-06-03 MED ORDER — FENTANYL CITRATE (PF) 100 MCG/2ML IJ SOLN
INTRAMUSCULAR | Status: AC
  Filled 2014-06-03: qty 2

## 2014-06-03 MED ORDER — PROPOFOL 10 MG/ML IV BOLUS
INTRAVENOUS | Status: AC
  Filled 2014-06-03: qty 20

## 2014-06-03 MED ORDER — CISATRACURIUM BESYLATE 20 MG/10ML IV SOLN
INTRAVENOUS | Status: AC
  Filled 2014-06-03: qty 10

## 2014-06-03 MED ORDER — DIPHENHYDRAMINE HCL 50 MG/ML IJ SOLN
INTRAMUSCULAR | Status: DC | PRN
  Administered 2014-06-03: 12.5 mg via INTRAVENOUS

## 2014-06-03 MED ORDER — ONDANSETRON HCL 4 MG/2ML IJ SOLN
INTRAMUSCULAR | Status: DC | PRN
  Administered 2014-06-03: 4 mg via INTRAVENOUS

## 2014-06-03 MED ORDER — HYDROMORPHONE HCL 1 MG/ML IJ SOLN
0.2500 mg | INTRAMUSCULAR | Status: DC | PRN
  Administered 2014-06-03 (×2): 0.5 mg via INTRAVENOUS

## 2014-06-03 MED ORDER — ONDANSETRON HCL 4 MG/2ML IJ SOLN
4.0000 mg | Freq: Four times a day (QID) | INTRAMUSCULAR | Status: DC | PRN
  Administered 2014-06-03: 4 mg via INTRAVENOUS
  Filled 2014-06-03 (×2): qty 2

## 2014-06-03 MED ORDER — ONDANSETRON HCL 4 MG PO TABS: 4.0000 mg | ORAL_TABLET | Freq: Four times a day (QID) | ORAL | Status: DC | PRN

## 2014-06-03 MED ORDER — METOCLOPRAMIDE HCL 5 MG/ML IJ SOLN
INTRAMUSCULAR | Status: DC | PRN
  Administered 2014-06-03: 10 mg via INTRAVENOUS

## 2014-06-03 MED ORDER — HYDROMORPHONE HCL 1 MG/ML IJ SOLN
INTRAMUSCULAR | Status: AC
  Filled 2014-06-03: qty 1

## 2014-06-03 MED ORDER — PROPOFOL 10 MG/ML IV BOLUS
INTRAVENOUS | Status: DC | PRN
  Administered 2014-06-03: 180 mg via INTRAVENOUS

## 2014-06-03 MED ORDER — NEOSTIGMINE METHYLSULFATE 10 MG/10ML IV SOLN
INTRAVENOUS | Status: AC
  Filled 2014-06-03: qty 1

## 2014-06-03 MED ORDER — CEFAZOLIN SODIUM-DEXTROSE 2-3 GM-% IV SOLR
2.0000 g | INTRAVENOUS | Status: AC
  Administered 2014-06-03: 2 g via INTRAVENOUS

## 2014-06-03 MED ORDER — CALCIUM CARBONATE 1250 (500 CA) MG PO TABS
2.0000 | ORAL_TABLET | Freq: Three times a day (TID) | ORAL | Status: DC
  Administered 2014-06-03 – 2014-06-04 (×3): 1000 mg via ORAL
  Filled 2014-06-03 (×6): qty 2

## 2014-06-03 MED ORDER — DEXAMETHASONE SODIUM PHOSPHATE 10 MG/ML IJ SOLN
INTRAMUSCULAR | Status: AC
  Filled 2014-06-03: qty 1

## 2014-06-03 MED ORDER — HYDROCODONE-ACETAMINOPHEN 5-325 MG PO TABS
1.0000 | ORAL_TABLET | ORAL | Status: DC | PRN
  Administered 2014-06-03: 1 via ORAL
  Filled 2014-06-03 (×2): qty 1

## 2014-06-03 MED ORDER — 0.9 % SODIUM CHLORIDE (POUR BTL) OPTIME
TOPICAL | Status: DC | PRN
  Administered 2014-06-03: 1000 mL

## 2014-06-03 MED ORDER — DEXAMETHASONE SODIUM PHOSPHATE 4 MG/ML IJ SOLN
INTRAMUSCULAR | Status: DC | PRN
  Administered 2014-06-03: 4 mg via INTRAVENOUS

## 2014-06-03 MED ORDER — MEPERIDINE HCL 50 MG/ML IJ SOLN: 6.2500 mg | INTRAMUSCULAR | Status: DC | PRN

## 2014-06-03 MED ORDER — CISATRACURIUM BESYLATE (PF) 10 MG/5ML IV SOLN
INTRAVENOUS | Status: DC | PRN
  Administered 2014-06-03 (×3): 2 mg via INTRAVENOUS
  Administered 2014-06-03: 6 mg via INTRAVENOUS

## 2014-06-03 MED ORDER — GLYCOPYRROLATE 0.2 MG/ML IJ SOLN
INTRAMUSCULAR | Status: DC | PRN
  Administered 2014-06-03: 0.4 mg via INTRAVENOUS

## 2014-06-03 MED ORDER — METOCLOPRAMIDE HCL 5 MG/ML IJ SOLN
INTRAMUSCULAR | Status: AC
  Filled 2014-06-03: qty 2

## 2014-06-03 MED ORDER — ACETAMINOPHEN 325 MG PO TABS
650.0000 mg | ORAL_TABLET | ORAL | Status: DC | PRN
  Administered 2014-06-03 – 2014-06-04 (×2): 650 mg via ORAL
  Filled 2014-06-03 (×2): qty 2

## 2014-06-03 MED ORDER — MIDAZOLAM HCL 5 MG/5ML IJ SOLN
INTRAMUSCULAR | Status: DC | PRN
  Administered 2014-06-03: 1 mg via INTRAVENOUS
  Administered 2014-06-03: 2 mg via INTRAVENOUS

## 2014-06-03 MED ORDER — CEFAZOLIN SODIUM-DEXTROSE 2-3 GM-% IV SOLR
INTRAVENOUS | Status: AC
  Filled 2014-06-03: qty 50

## 2014-06-03 MED ORDER — PROMETHAZINE HCL 25 MG/ML IJ SOLN: 6.2500 mg | INTRAMUSCULAR | Status: DC | PRN

## 2014-06-03 MED ORDER — LIDOCAINE HCL (CARDIAC) 20 MG/ML IV SOLN
INTRAVENOUS | Status: AC
  Filled 2014-06-03: qty 5

## 2014-06-03 MED ORDER — LACTATED RINGERS IV SOLN
INTRAVENOUS | Status: DC | PRN
  Administered 2014-06-03 (×3): via INTRAVENOUS

## 2014-06-03 MED ORDER — SUCCINYLCHOLINE CHLORIDE 20 MG/ML IJ SOLN
INTRAMUSCULAR | Status: DC | PRN
  Administered 2014-06-03: 100 mg via INTRAVENOUS

## 2014-06-03 MED ORDER — GLYCOPYRROLATE 0.2 MG/ML IJ SOLN
INTRAMUSCULAR | Status: AC
  Filled 2014-06-03: qty 3

## 2014-06-03 MED ORDER — HYDROMORPHONE HCL 1 MG/ML IJ SOLN
1.0000 mg | INTRAMUSCULAR | Status: DC | PRN
  Administered 2014-06-03 (×2): 1 mg via INTRAVENOUS
  Filled 2014-06-03 (×2): qty 1

## 2014-06-03 MED ORDER — LIDOCAINE HCL (CARDIAC) 20 MG/ML IV SOLN
INTRAVENOUS | Status: DC | PRN
  Administered 2014-06-03: 50 mg via INTRAVENOUS

## 2014-06-03 MED ORDER — MIDAZOLAM HCL 2 MG/2ML IJ SOLN
INTRAMUSCULAR | Status: AC
  Filled 2014-06-03: qty 2

## 2014-06-03 MED ORDER — FENTANYL CITRATE (PF) 100 MCG/2ML IJ SOLN
INTRAMUSCULAR | Status: DC | PRN
  Administered 2014-06-03 (×2): 25 ug via INTRAVENOUS
  Administered 2014-06-03 (×6): 50 ug via INTRAVENOUS

## 2014-06-03 MED ORDER — KCL IN DEXTROSE-NACL 20-5-0.45 MEQ/L-%-% IV SOLN
INTRAVENOUS | Status: DC
  Administered 2014-06-03: 12:00:00 via INTRAVENOUS
  Filled 2014-06-03 (×2): qty 1000

## 2014-06-03 MED ORDER — FENTANYL CITRATE (PF) 250 MCG/5ML IJ SOLN
INTRAMUSCULAR | Status: AC
  Filled 2014-06-03: qty 5

## 2014-06-03 MED ORDER — FENTANYL CITRATE (PF) 100 MCG/2ML IJ SOLN: 25.0000 ug | INTRAMUSCULAR | Status: DC | PRN

## 2014-06-03 MED ORDER — LACTATED RINGERS IV SOLN: INTRAVENOUS | Status: DC

## 2014-06-03 MED ORDER — ONDANSETRON HCL 4 MG/2ML IJ SOLN
INTRAMUSCULAR | Status: AC
  Filled 2014-06-03: qty 2

## 2014-06-03 MED ORDER — DIPHENHYDRAMINE HCL 50 MG/ML IJ SOLN
INTRAMUSCULAR | Status: AC
  Filled 2014-06-03: qty 1

## 2014-06-03 MED ORDER — NEOSTIGMINE METHYLSULFATE 10 MG/10ML IV SOLN
INTRAVENOUS | Status: DC | PRN
  Administered 2014-06-03: 3 mg via INTRAVENOUS

## 2014-06-03 SURGICAL SUPPLY — 35 items
APL SKNCLS STERI-STRIP NONHPOA (GAUZE/BANDAGES/DRESSINGS) ×1
ATTRACTOMAT 16X20 MAGNETIC DRP (DRAPES) ×2 IMPLANT
BENZOIN TINCTURE PRP APPL 2/3 (GAUZE/BANDAGES/DRESSINGS) ×1 IMPLANT
BLADE HEX COATED 2.75 (ELECTRODE) ×2 IMPLANT
BLADE SURG 15 STRL LF DISP TIS (BLADE) ×1 IMPLANT
BLADE SURG 15 STRL SS (BLADE) ×2
CHLORAPREP W/TINT 26ML (MISCELLANEOUS) ×2 IMPLANT
CLIP TI MEDIUM 6 (CLIP) ×4 IMPLANT
CLIP TI WIDE RED SMALL 6 (CLIP) ×5 IMPLANT
DISSECTOR ROUND CHERRY 3/8 STR (MISCELLANEOUS) IMPLANT
DRAPE LAPAROTOMY T 98X78 PEDS (DRAPES) ×2 IMPLANT
DRESSING SURGICEL FIBRLLR 1X2 (HEMOSTASIS) ×1 IMPLANT
DRSG SURGICEL FIBRILLAR 1X2 (HEMOSTASIS) ×2
ELECT REM PT RETURN 9FT ADLT (ELECTROSURGICAL) ×2
ELECTRODE REM PT RTRN 9FT ADLT (ELECTROSURGICAL) ×1 IMPLANT
GAUZE SPONGE 4X4 12PLY STRL (GAUZE/BANDAGES/DRESSINGS) IMPLANT
GAUZE SPONGE 4X4 16PLY XRAY LF (GAUZE/BANDAGES/DRESSINGS) ×2 IMPLANT
GLOVE SURG ORTHO 8.0 STRL STRW (GLOVE) ×2 IMPLANT
GOWN STRL REUS W/TWL XL LVL3 (GOWN DISPOSABLE) ×4 IMPLANT
KIT BASIN OR (CUSTOM PROCEDURE TRAY) ×2 IMPLANT
PACK BASIC VI WITH GOWN DISP (CUSTOM PROCEDURE TRAY) ×2 IMPLANT
PENCIL BUTTON HOLSTER BLD 10FT (ELECTRODE) ×2 IMPLANT
SHEARS HARMONIC 9CM CVD (BLADE) ×2 IMPLANT
STAPLER VISISTAT 35W (STAPLE) IMPLANT
STRIP CLOSURE SKIN 1/2X4 (GAUZE/BANDAGES/DRESSINGS) ×2 IMPLANT
SUT MNCRL AB 4-0 PS2 18 (SUTURE) ×2 IMPLANT
SUT SILK 2 0 (SUTURE)
SUT SILK 2-0 18XBRD TIE 12 (SUTURE) IMPLANT
SUT SILK 3 0 (SUTURE)
SUT SILK 3-0 18XBRD TIE 12 (SUTURE) IMPLANT
SUT VIC AB 3-0 SH 18 (SUTURE) ×4 IMPLANT
SYR BULB IRRIGATION 50ML (SYRINGE) ×2 IMPLANT
TOWEL OR 17X26 10 PK STRL BLUE (TOWEL DISPOSABLE) ×2 IMPLANT
TOWEL OR NON WOVEN STRL DISP B (DISPOSABLE) ×2 IMPLANT
YANKAUER SUCT BULB TIP 10FT TU (MISCELLANEOUS) ×2 IMPLANT

## 2014-06-03 NOTE — H&P (Signed)
General Surgery [facility]  Arnoldo Hooker. Steve DOB: 08-29-1985 Married / Language: English / Race: White Female  History of Present Illness  The patient is a 29 year old female who presents with thyroid cancer. Patient is referred by Dr. Ria Bush for evaluation of newly diagnosed papillary thyroid carcinoma. Patient has a history of hypothyroidism dating back approximately 10 years. She has been on Synthroid 125 g daily. Patient was seen and evaluated and noted to have an enlarged thyroid gland on physical examination. She underwent thyroid ultrasound on April 06, 2014 at [facility]. This showed a solitary 1.4 cm nodule in the upper right thyroid lobe with microcalcifications. She subsequently underwent fine-needle aspiration biopsy which was positive for papillary thyroid carcinoma. Patient is referred for surgical evaluation and recommendations. Patient has no prior history of head or neck surgery. She has a family history of a maternal grandmother with hypothyroidism. There is no family history of other endocrine neoplasms. Patient denies any tremors. She denies palpitations. She denies any compressive symptoms. She is accompanied today by her husband.   Other Problems Thyroid Cancer Thyroid Disease  Past Surgical History  Oral Surgery  Diagnostic Studies History Colonoscopy never Mammogram within last year Pap Smear 1-5 years ago  Allergies  No Known Drug Allergies04/27/2016  Medication History  Levothyroxine Sodium (125MCG Tablet, Oral) Active. Sprintec 28 (0.25-35MG -MCG Tablet, Oral) Active. Medications Reconciled  Social History Alcohol use Moderate alcohol use. Caffeine use Carbonated beverages, Coffee, Tea. No drug use Tobacco use Never smoker.  Family History  Breast Cancer Mother. Diabetes Mellitus Father. Heart Disease Father.  Pregnancy / Birth History Age at menarche 74  years. Contraceptive History Oral contraceptives. Gravida 0 Para 0 Regular periods  Review of Systems General Not Present- Appetite Loss, Chills, Fatigue, Fever, Night Sweats, Weight Gain and Weight Loss. Skin Not Present- Change in Wart/Mole, Dryness, Hives, Jaundice, New Lesions, Non-Healing Wounds, Rash and Ulcer. HEENT Present- Wears glasses/contact lenses. Not Present- Earache, Hearing Loss, Hoarseness, Nose Bleed, Oral Ulcers, Ringing in the Ears, Seasonal Allergies, Sinus Pain, Sore Throat, Visual Disturbances and Yellow Eyes. Respiratory Not Present- Bloody sputum, Chronic Cough, Difficulty Breathing, Snoring and Wheezing. Breast Not Present- Breast Mass, Breast Pain, Nipple Discharge and Skin Changes. Cardiovascular Not Present- Chest Pain, Difficulty Breathing Lying Down, Leg Cramps, Palpitations, Rapid Heart Rate, Shortness of Breath and Swelling of Extremities. Gastrointestinal Present- Constipation. Not Present- Abdominal Pain, Bloating, Bloody Stool, Change in Bowel Habits, Chronic diarrhea, Difficulty Swallowing, Excessive gas, Gets full quickly at meals, Hemorrhoids, Indigestion, Nausea, Rectal Pain and Vomiting. Female Genitourinary Not Present- Frequency, Nocturia, Painful Urination, Pelvic Pain and Urgency. Musculoskeletal Not Present- Back Pain, Joint Pain, Joint Stiffness, Muscle Pain, Muscle Weakness and Swelling of Extremities. Neurological Not Present- Decreased Memory, Fainting, Headaches, Numbness, Seizures, Tingling, Tremor, Trouble walking and Weakness. Psychiatric Not Present- Anxiety, Bipolar, Change in Sleep Pattern, Depression, Fearful and Frequent crying. Endocrine Not Present- Cold Intolerance, Excessive Hunger, Hair Changes, Heat Intolerance, Hot flashes and New Diabetes. Hematology Not Present- Easy Bruising, Excessive bleeding, Gland problems, HIV and Persistent Infections.   Vitals  Weight: 164 lb Height: 71in Body Surface Area: 1.93 m Body  Mass Index: 22.87 kg/m Temp.: 97.42F(Temporal)  Pulse: 77 (Regular)  BP: 132/87 (Sitting, Left Arm, Standard)    Physical Exam  General - appears comfortable, no distress; not diaphorectic  HEENT - normocephalic; sclerae clear, gaze conjugate; mucous membranes moist, dentition good; voice normal  Neck - asymmetric on extension; no palpable anterior or  posterior cervical adenopathy; small smooth mobile nontender nodule approximately 1.5-2 cm in size in the upper right thyroid lobe; isthmus and left thyroid lobe without dominant or discrete mass; few palpable anterior cervical lymph nodes on the right without gross abnormality  Chest - clear bilaterally without rhonchi, rales, or wheeze  Cor - regular rhythm with normal rate; no significant murmur  Ext - non-tender without significant edema or lymphedema  Neuro - grossly intact; no tremor   Assessment & Plan  PAPILLARY THYROID CARCINOMA (193  C73)  Patient has papillary thyroid carcinoma arising in a 1.4 cm nodule in the right thyroid lobe. I have provided she and her husband with multiple written materials to review at home.  I have recommended total thyroidectomy with limited central compartment left node dissection for management. We have discussed the procedure at length. We have discussed risk and benefits including the risk of recurrent laryngeal nerve injury and injury to parathyroid glands. We have discussed the hospital stay to be anticipated. We have discussed the surgical incision. We have discussed her postoperative recovery and return to activity. We have discussed the need for lifelong thyroid hormone replacement. We have discussed the potential need for radioactive iodine treatment and consultation with endocrinology. Patient and her husband understand and wish to proceed with surgery in the near future.  The risks and benefits of the procedure have been discussed at length with the patient. The patient understands  the proposed procedure, potential alternative treatments, and the course of recovery to be expected. All of the patient's questions have been answered at this time. The patient wishes to proceed with surgery.  Earnstine Regal, MD, [facility] Office: [phone]

## 2014-06-03 NOTE — Anesthesia Preprocedure Evaluation (Addendum)
Anesthesia Evaluation  Patient identified by MRN, date of birth, ID band Patient awake    Reviewed: Allergy & Precautions, NPO status , Patient's Chart, lab work & pertinent test results  Airway Mallampati: II  TM Distance: >3 FB Neck ROM: Full    Dental no notable dental hx.    Pulmonary neg pulmonary ROS,  breath sounds clear to auscultation  Pulmonary exam normal       Cardiovascular negative cardio ROS Normal cardiovascular examRhythm:Regular Rate:Normal     Neuro/Psych negative neurological ROS  negative psych ROS   GI/Hepatic negative GI ROS, Neg liver ROS,   Endo/Other  negative endocrine ROS  Renal/GU Renal InsufficiencyRenal diseasenegative Renal ROS  negative genitourinary   Musculoskeletal negative musculoskeletal ROS (+)   Abdominal   Peds negative pediatric ROS (+)  Hematology negative hematology ROS (+)   Anesthesia Other Findings   Reproductive/Obstetrics negative OB ROS                            Anesthesia Physical Anesthesia Plan  ASA: I  Anesthesia Plan: General   Post-op Pain Management:    Induction: Intravenous  Airway Management Planned: Oral ETT  Additional Equipment:   Intra-op Plan:   Post-operative Plan: Extubation in OR  Informed Consent: I have reviewed the patients History and Physical, chart, labs and discussed the procedure including the risks, benefits and alternatives for the proposed anesthesia with the patient or authorized representative who has indicated his/her understanding and acceptance.   Dental advisory given  Plan Discussed with: CRNA  Anesthesia Plan Comments:         Anesthesia Quick Evaluation

## 2014-06-03 NOTE — Anesthesia Procedure Notes (Signed)
Procedure Name: Intubation Date/Time: 06/03/2014 7:27 AM Performed by: Deliah Boston Pre-anesthesia Checklist: Patient identified, Emergency Drugs available, Suction available and Patient being monitored Patient Re-evaluated:Patient Re-evaluated prior to inductionOxygen Delivery Method: Circle System Utilized Preoxygenation: Pre-oxygenation with 100% oxygen Intubation Type: IV induction Ventilation: Mask ventilation without difficulty Laryngoscope Size: Mac and 3 Grade View: Grade I Tube type: Oral Number of attempts: 1 Airway Equipment and Method: Oral airway Placement Confirmation: ETT inserted through vocal cords under direct vision,  positive ETCO2 and breath sounds checked- equal and bilateral Secured at: 23 cm Tube secured with: Tape Dental Injury: Teeth and Oropharynx as per pre-operative assessment

## 2014-06-03 NOTE — Op Note (Signed)
Procedure Note  Pre-operative Diagnosis:  Papillary thyroid carcinoma  Post-operative Diagnosis:  same  Surgeon:  Earnstine Regal, MD, FACS  Assistant:  none   Procedure:  Total thyroidectomy with limited central compartment lymph node dissection  Anesthesia:  General  Estimated Blood Loss:  minimal  Drains: none         Specimen: thyroid to pathology  Indications:  Patient is referred by Dr. Ria Bush for evaluation of newly diagnosed papillary thyroid carcinoma. Patient has a history of hypothyroidism dating back approximately 10 years. She has been on Synthroid 125 g daily. Patient was seen and evaluated and noted to have an enlarged thyroid gland on physical examination. She underwent thyroid ultrasound on April 06, 2014 at [facility]. This showed a solitary 1.4 cm nodule in the upper right thyroid lobe with microcalcifications. She subsequently underwent fine-needle aspiration biopsy which was positive for papillary thyroid carcinoma.   Procedure Details: Procedure was done in OR #1 at the [facility].  The patient was brought to the operating room and placed in a supine position on the operating room table.  Following administration of general anesthesia, the patient was positioned and then prepped and draped in the usual aseptic fashion.  After ascertaining that an adequate level of anesthesia had been achieved, a Kocher incision was made with #15 blade.  Dissection was carried through subcutaneous tissues and platysma. Hemostasis was achieved with the electrocautery.  Skin flaps were elevated cephalad and caudad from the thyroid notch to the sternal notch.  The Mahorner self-retaining retractor was placed for exposure.  Strap muscles were incised in the midline and dissection was begun on the left side.  Strap muscles were reflected laterally.  Left thyroid lobe was normal in size.  The left lobe was gently mobilized with blunt dissection.   Superior pole vessels were dissected out and divided individually between small and medium Ligaclips with the Harmonic scalpel.  The thyroid lobe was rolled anteriorly.  Branches of the inferior thyroid artery were divided between small Ligaclips with the Harmonic scalpel.  Inferior venous tributaries were divided between Ligaclips.  Both the superior and inferior parathyroid glands were identified and preserved on their vascular pedicles.  The recurrent laryngeal nerve was identified and preserved along its course.  The ligament of Gwenlyn Found was released with the electrocautery and the gland was mobilized onto the anterior trachea. Isthmus was mobilized across the midline.  There was a moderate sized pyramidal lobe present which was dissected out and resected with the isthmus.  Dry pack was placed in the left neck.  Next, the right thyroid lobe was gently mobilized with blunt dissection.  Right thyroid lobe was mildly enlarged with a palpable tumor in the superior pole.  Superior pole vessels were dissected out and divided between small and medium Ligaclips with the Harmonic scalpel.  Superior parathyroid was identified and preserved.  Inferior venous tributaries were divided between medium Ligaclips with the Harmonic scalpel.  The right thyroid lobe was rolled anteriorly and the branches of the inferior thyroid artery divided between small Ligaclips.  The right recurrent laryngeal nerve was identified and preserved along its course.  The ligament of Gwenlyn Found was released with the electrocautery.  The right thyroid lobe was mobilized onto the anterior trachea and the remainder of the thyroid was dissected off the anterior trachea and the thyroid was completely excised.  A suture was used to mark the right superior pole. The entire thyroid gland was submitted to pathology  for review.  Central compartment lymph nodes were dissected out.  There appeared to be multiple mildly enlarged nodes.  Dissection was carried from  the right carotid to the left carotid taking care to avoid the parathyroid gland on the left.  Tissue was submitted to pathology for review.  The neck was irrigated with warm saline.  Fibular was placed throughout the operative field.  Strap muscles were reapproximated in the midline with interrupted 3-0 Vicryl sutures.  Platysma was closed with interrupted 3-0 Vicryl sutures.  Skin was closed with a running 4-0 Monocryl subcuticular suture.  Wound was washed and dried and benzoin and steri-strips were applied.  Dry gauze dressing was placed.  The patient was awakened from anesthesia and brought to the recovery room.  The patient tolerated the procedure well.   Earnstine Regal, MD, [facility] Office: [phone]

## 2014-06-03 NOTE — Anesthesia Postprocedure Evaluation (Signed)
  Anesthesia Post-op Note  Patient: Lauren Morton  Procedure(s) Performed: Procedure(s) (LRB): TOTAL THYROIDECTOMY WITH LIMITED LYMPH NODE DISSECTION (N/A) LIMITED LYMPH NODE DISSECTION (N/A)  Patient Location: PACU  Anesthesia Type: General  Level of Consciousness: awake and alert   Airway and Oxygen Therapy: Patient Spontanous Breathing  Post-op Pain: mild  Post-op Assessment: Post-op Vital signs reviewed, Patient's Cardiovascular Status Stable, Respiratory Function Stable, Patent Airway and No signs of Nausea or vomiting  Last Vitals:  Filed Vitals:   06/03/14 1340  BP: 106/67  Pulse: 77  Temp: 37.1 C  Resp: 15    Post-op Vital Signs: stable   Complications: No apparent anesthesia complications

## 2014-06-03 NOTE — Transfer of Care (Signed)
Immediate Anesthesia Transfer of Care Note  Patient: Lauren Morton  Procedure(s) Performed: Procedure(s): TOTAL THYROIDECTOMY WITH LIMITED LYMPH NODE DISSECTION (N/A) LIMITED LYMPH NODE DISSECTION (N/A)  Patient Location: PACU  Anesthesia Type:General  Level of Consciousness: Patient easily awoken, sedated, comfortable, cooperative, following commands, responds to stimulation.   Airway & Oxygen Therapy: Patient spontaneously breathing, ventilating well, oxygen via simple oxygen mask.  Post-op Assessment: Report given to PACU RN, vital signs reviewed and stable, moving all extremities.   Post vital signs: Reviewed and stable.  Complications: No apparent anesthesia complications

## 2014-06-04 ENCOUNTER — Encounter: Payer: Self-pay | Admitting: Family Medicine

## 2014-06-04 DIAGNOSIS — C73 Malignant neoplasm of thyroid gland: Secondary | ICD-10-CM | POA: Diagnosis not present

## 2014-06-04 LAB — BASIC METABOLIC PANEL
Anion gap: 8 (ref 5–15)
BUN: 6 mg/dL (ref 6–20)
CHLORIDE: 106 mmol/L (ref 101–111)
CO2: 26 mmol/L (ref 22–32)
Calcium: 8.4 mg/dL — ABNORMAL LOW (ref 8.9–10.3)
Creatinine, Ser: 0.52 mg/dL (ref 0.44–1.00)
Glucose, Bld: 110 mg/dL — ABNORMAL HIGH (ref 65–99)
POTASSIUM: 3.6 mmol/L (ref 3.5–5.1)
Sodium: 140 mmol/L (ref 135–145)

## 2014-06-04 MED ORDER — PROMETHAZINE HCL 25 MG/ML IJ SOLN
12.5000 mg | Freq: Four times a day (QID) | INTRAMUSCULAR | Status: DC | PRN
  Administered 2014-06-04: 12.5 mg via INTRAVENOUS
  Filled 2014-06-04: qty 1

## 2014-06-04 MED ORDER — HYDROCODONE-ACETAMINOPHEN 5-325 MG PO TABS: 1.0000 | ORAL_TABLET | ORAL | Status: DC | PRN

## 2014-06-04 MED ORDER — CALCIUM CARBONATE 1250 (500 CA) MG PO TABS: 2.0000 | ORAL_TABLET | Freq: Three times a day (TID) | ORAL | Status: DC

## 2014-06-04 NOTE — Discharge Summary (Signed)
Physician Discharge Summary [facility]  Patient ID: Lauren Morton MRN: [id number] DOB/AGE: 04-Jun-1985 29 y.o.  Admit date: 06/03/2014 Discharge date: 06/04/2014  Admission Diagnoses:  Papillary thyroid carcinoma  Discharge Diagnoses:  Principal Problem:   Papillary thyroid carcinoma Active Problems:   Hypothyroidism   Discharged Condition: good  Hospital Course: Patient was admitted for observation following thyroid surgery.  Post op course was uncomplicated.  Pain was well controlled.  Tolerated diet.  Post op calcium level on morning following surgery was 8.4 mg/dl.  Patient was prepared for discharge home on POD#1.  Consults: None  Treatments: surgery: total thyroidectomy with limited node dissection  Discharge Exam: Blood pressure 114/61, pulse 65, temperature 98.7 F (37.1 C), temperature source Oral, resp. rate 16, height 5\' 11"  (1.803 m), weight 72.576 kg (160 lb), last menstrual period 05/27/2014, SpO2 97 %. HEENT - clear Neck - wound dry and intact, minimal STS, voice slightly hoarse Chest - clear bilaterally Cor - RRR  Disposition: Home  Discharge Instructions    Diet - low sodium heart healthy    Complete by:  As directed      Discharge instructions    Complete by:  As directed   [facility]  THYROID & PARATHYROID SURGERY:  POST-OP INSTRUCTIONS  Always review your discharge instruction sheet from the facility where your surgery was performed.  A prescription for pain medication may be given to you upon discharge.  Take your pain medication as prescribed.  If narcotic pain medicine is not needed, then you may take acetaminophen (Tylenol) or ibuprofen (Advil) as needed.  Take your usually prescribed medications unless otherwise directed.  If you need a refill on your pain medication, please contact your pharmacy. They will contact our office to request authorization.  Prescriptions will not be processed by our office  after 5 pm or on weekends.  Start with a light diet upon arrival home, such as soup and crackers or toast.  Be sure to drink plenty of fluids daily.  Resume your normal diet the day after surgery.  Most patients will experience some swelling and bruising on the chest and neck area.  Ice packs will help.  Swelling and bruising can take several days to resolve.   It is common to experience some constipation after surgery.  Increasing fluid intake and taking a stool softener will usually help or prevent this problem.  A mild laxative (Milk of Magnesia or Miralax) should be taken according to package directions if there has been no bowel movement after 48 hours.  You have steri-strips and a gauze dressing over your incision.  You may remove the gauze bandage on the second day after surgery, and you may shower at that time.  Leave your steri-strips (small skin tapes) in place directly over the incision.  These strips should remain on the skin for 5-7 days and then be removed.  You may get them wet in the shower and pat them dry.  You may resume regular (light) daily activities beginning the next day - such as daily self-care, walking, climbing stairs - gradually increasing activities as tolerated.  You may have sexual intercourse when it is comfortable.  Refrain from any heavy lifting or straining until approved by your doctor.  You may drive when you no longer are taking prescription pain medication, you can comfortably wear a seatbelt, and you can safely maneuver your car and apply brakes.  You should see your doctor in the office for  a follow-up appointment approximately two to three weeks after your surgery.  Make sure that you call for this appointment within a day or two after you arrive home to insure a convenient appointment time.  WHEN TO CALL YOUR DOCTOR: -- Fever greater than 101.5 -- Inability to urinate -- Nausea and/or vomiting - persistent -- Extreme swelling or bruising -- Continued  bleeding from incision -- Increased pain, redness, or drainage from the incision -- Difficulty swallowing or breathing -- Muscle cramping or spasms -- Numbness or tingling in hands or around lips  The clinic staff is available to answer your questions during regular business hours.  Please don't hesitate to call and ask to speak to one of the nurses if you have concerns.  Earnstine Regal, MD, [facility] Office: [phone]  Website: [email or web address]     Increase activity slowly    Complete by:  As directed      Remove dressing in 24 hours    Complete by:  As directed             Medication List    TAKE these medications        calcium carbonate 1250 (500 CA) MG tablet  Commonly known as:  OS-CAL - dosed in mg of elemental calcium  Take 2 tablets (1,000 mg of elemental calcium total) by mouth 3 (three) times daily with meals.     HYDROcodone-acetaminophen 5-325 MG per tablet  Commonly known as:  NORCO/VICODIN  Take 1-2 tablets by mouth every 4 (four) hours as needed for moderate pain.     levothyroxine 125 MCG tablet  Commonly known as:  SYNTHROID, LEVOTHROID  Take 1 tablet (125 mcg total) by mouth daily.     TRI-PREVIFEM 0.18/0.215/0.25 MG-35 MCG tablet  Generic drug:  Norgestimate-Ethinyl Estradiol Triphasic  Take 1 tablet by mouth daily.         Earnstine Regal, MD, [facility] Office: [phone]   Signed: Earnstine Regal 06/04/2014, 7:10 AM

## 2014-06-04 NOTE — Progress Notes (Signed)
Pt tolerated breakfast with no complaints of nausea or vomiting and vitals are stable. Wound was redressed and within normal limits. Pt understood discharge instructions and will follow up with doctor in three weeks.

## 2014-06-05 ENCOUNTER — Encounter: Payer: Self-pay | Admitting: Family Medicine

## 2014-07-01 ENCOUNTER — Encounter: Payer: Self-pay | Admitting: Family Medicine

## 2014-08-18 ENCOUNTER — Encounter: Payer: Self-pay | Admitting: Internal Medicine

## 2014-08-18 ENCOUNTER — Ambulatory Visit (INDEPENDENT_AMBULATORY_CARE_PROVIDER_SITE_OTHER): Payer: BC Managed Care – PPO | Admitting: Internal Medicine

## 2014-08-18 VITALS — BP 118/62 | HR 63 | Temp 97.7°F | Resp 12 | Ht 71.5 in | Wt 159.0 lb

## 2014-08-18 DIAGNOSIS — E890 Postprocedural hypothyroidism: Secondary | ICD-10-CM | POA: Insufficient documentation

## 2014-08-18 DIAGNOSIS — C73 Malignant neoplasm of thyroid gland: Secondary | ICD-10-CM

## 2014-08-18 DIAGNOSIS — E039 Hypothyroidism, unspecified: Secondary | ICD-10-CM | POA: Insufficient documentation

## 2014-08-18 LAB — T4, FREE: FREE T4: 0.91 ng/dL (ref 0.60–1.60)

## 2014-08-18 LAB — TSH: TSH: 13.02 u[IU]/mL — AB (ref 0.35–4.50)

## 2014-08-18 NOTE — Progress Notes (Signed)
Patient ID: Lauren Morton, female   DOB: Dec 12, 1985, 29 y.o.   MRN: [id number]   HPI  Lauren Morton is a 29 y.o.-year-old female, referred by her PCP, Dr. Danise Mina, for management of papillary thyroid cancer and postsurgical hypothyroidism.  Pt. has been dx with goiter ~ 2006 >> thyroid U/S: ? Results but also dx'ed with Hashimoto's thyroiditis with hypothyroidism >> started LY4.  She started to see Dr Danise Mina 03/2014 >> new U/S (04/2014): small R thyroid nodule 1.4 x 1.3 x 1.1 cm, with microcalcifications >>  PTC in 04/2014 by [facility].   - 06/03/2014: Total thyroidectomy - Dr Harlow Asa. Path: Diagnosis 1. Thyroid, thyroidectomy, total thyroid, suture right superior pole - PAPILLARY THYROID CARCINOMA, 1.5 CM IN GREATEST DIMENSION. - MARGINS ARE NEGATIVE. - BACKGROUND FLORID LYMPHOCYTIC THYROIDITIS. - SEE ONCOLOGY TEMPLATE. 2. Lymph nodes, regional resection, central compartment - TEN BENIGN LYMPH NODES WITH NO TUMOR SEEN (0/10). Microscopic Comment 1. THYROID Specimen: Thyroid with central compartment lymph nodes. Procedure: Total thyroidectomy with limited central compartment lymph dissection. Specimen Integrity (intact/fragmented): Intact. Tumor focality: Unifocal. Dominant tumor: Maximum tumor size (cm): 1.5 cm. Tumor laterality: Tumor involves right superior pole. Histologic type (including subtype and/or unique features as applicable): Papillary thyroid carcinoma, conventional type. Tumor capsule: Tumor is partially encapsulated. Extrathyroidal extension: No. Margins: Negative. Lymph - Vascular invasion: Definitive lymph/vascular is not identified. Capsular invasion with degree of invasion if present: No capsular invasion identified. Lymph nodes: # examined 10; # positive 0. TNM code: pT1b, pN0. Non-neoplastic thyroid: Florid lymphocytic thyroiditis. (RH:ds 06/04/14)  Pt denies feeling nodules in neck, hoarseness, dysphagia/odynophagia, SOB with lying down.  For her  postsurgical hypothyroidism, she is on Levothyroxine 125 mcg, taken: - fasting - with water - separated by >30 min from b'fast  - no calcium (was on this after the Sx for 3 weeks - stopped 1 mo ago), iron, PPIs, multivitamins   I reviewed pt's thyroid tests: Lab Results  Component Value Date   TSH 3.83 03/31/2014   TSH 1.25 03/25/2012   TSH 0.84 10/31/2010   TSH 1.55 05/02/2009   TSH 10.43* 04/29/2006   FREET4 0.77 03/31/2014   FREET4 0.6 04/29/2006    Pt denies: - fatigue - weight gain - constipation - dry skin - hair falling - depression She has a little cold intolerance.  She has + FH of thyroid disorders in: MGM - Hashimoto's ds. No FH of thyroid cancer.  No h/o radiation tx to head or neck.  ROS: Constitutional: no weight gain/loss, no fatigue, no subjective hyperthermia/hypothermia Eyes: no blurry vision, no xerophthalmia ENT: no sore throat, no nodules palpated in throat, + dysphagia after sx/no odynophagia, no hoarseness Cardiovascular: no CP/SOB/palpitations/leg swelling Respiratory: no cough/SOB Gastrointestinal: no N/V/D/C Musculoskeletal: no muscle/joint aches Skin: no rashes Neurological: no tremors/numbness/tingling/dizziness Psychiatric: no depression/anxiety  Past Medical History  Diagnosis Date  . Hashimoto's thyroiditis     confirmed by biopsy s/p thyroidectomy  . Hypothyroidism, acquired, autoimmune   . Papillary thyroid carcinoma 03/31/2014    s/p thyroidectomy, 10 negative LN  . CKD (chronic kidney disease) stage 1, GFR 90 ml/min or greater 09/2013    mild proteinuria Lauren Morton) sees Dr Ihor Austin yearly   Past Surgical History  Procedure Laterality Date  . Wisdom tooth extraction      4  . Total thyroidectomy  06/2014    with limited central lymph node dissection ([place])  . Thyroidectomy N/A 06/03/2014    total - 1.5cm papillary thyroid carcinoma with negative surgical margins and  10/10 LN neg for mets; Armandina Gemma, MD  . Lymph node  dissection N/A 06/03/2014    Procedure: LIMITED LYMPH NODE DISSECTION;  Surgeon: Armandina Gemma, MD;  Location: [facility];  Service: General;  Laterality: N/A;   Social History   Social History  . Marital Status: Married    Spouse Name: N/A  . Number of Children: 0   Occupational History  . School counselor   Social History Main Topics  . Smoking status: Never Smoker   . Smokeless tobacco: Never Used  . Alcohol Use: Yes     Comment: social use  . Drug Use: No   Social History Narrative   Caffeine: 1 soda at lunch   Lives with husband, 2 dogs and 1 pig, chickens   Occupation: Animal nutritionist   Edu: Masters in education   Activity: runs, has done 1/2 marathons   Diet: good water, good fruits/vegetables, red meat 2x/wk, fish 1x/wk   Current Outpatient Prescriptions on File Prior to Visit  Medication Sig Dispense Refill  . levothyroxine (SYNTHROID, LEVOTHROID) 125 MCG tablet Take 1 tablet (125 mcg total) by mouth daily. 90 tablet 3  . TRI-PREVIFEM 0.18/0.215/0.25 MG-35 MCG tablet Take 1 tablet by mouth daily.  11  . calcium carbonate (OS-CAL - DOSED IN MG OF ELEMENTAL CALCIUM) 1250 (500 CA) MG tablet Take 2 tablets (1,000 mg of elemental calcium total) by mouth 3 (three) times daily with meals. (Patient not taking: Reported on 08/18/2014) 60 tablet 2  . HYDROcodone-acetaminophen (NORCO/VICODIN) 5-325 MG per tablet Take 1-2 tablets by mouth every 4 (four) hours as needed for moderate pain. (Patient not taking: Reported on 08/18/2014) 20 tablet 0   No current facility-administered medications on file prior to visit.   No Known Allergies Family History  Problem Relation Age of Onset  . Cancer Mother 22    breast cancer, s/p mastectomy  . Diabetes Father   . Coronary artery disease Maternal Grandfather     several MIs  . Stroke Neg Hx   . Cancer Maternal Grandmother     breast cancer s/p lumpectomy  . Thyroid disease Maternal Grandmother   . Alcohol abuse Paternal Grandmother     PE: BP 118/62 mmHg  Pulse 63  Temp(Src) 97.7 F (36.5 C) (Oral)  Resp 12  Ht 5' 11.5" (1.816 m)  Wt 159 lb (72.122 kg)  BMI 21.87 kg/m2  SpO2 99% Wt Readings from Last 3 Encounters:  08/18/14 159 lb (72.122 kg)  06/03/14 160 lb (72.576 kg)  05/27/14 160 lb 2 oz (72.632 kg)   Constitutional: normal weight, in NAD Eyes: PERRLA, EOMI, no exophthalmos ENT: moist mucous membranes, no thyroid masses, cervical scar healing, no dysesthesia, no cervical lymphadenopathy Cardiovascular: RRR, No MRG Respiratory: CTA B Gastrointestinal: abdomen soft, NT, ND, BS+ Musculoskeletal: no deformities, strength intact in all 4 Skin: moist, warm, no rashes Neurological: no tremor with outstretched hands, DTR normal in all 4  ASSESSMENT: 1. Thyroid cancer - see HPI  2. Postsurgical Hypothyroidism  PLAN:  1. Thyroid cancer - papillary - I had a long discussion with the patient about her recent diagnosis of thyroid cancer. We reviewed together the pathology report >> she is stage 1 TNM  - I reassured her that papillary thyroid cancer is a slow growing cancer with good prognosis; her life expectancy is unlikely to be reduced due to the cancer.  - since the tumor was 1.5 cm, radioactive iodine is not definitely indicated for post-op thyroid remnant ablation. I explained  that the main role of this is to facilitate monitoring in the long run (by checking thyroglobulin). Since she is low risk, and her tumor was limited to the 1.5 cm focus, without lymphovascular invasion, patient decided not to have the radioactive iodine treatment. We discussed that, in this case, we will follow her by initially annual neck ultrasound, and then spread apart. We also discussed about the possible use of thyroglobulin, ideally having stable or declining levels after thyroidectomy. If this increases >> imaging tests are indicated. - we will check a thyroglobulin and Tg antibodies today - we can follow the levels, and many  times the thyroglobulin can become undetectable even without radioactive iodine treatment  2. Patient with h/o total thyroidectomy for cancer, previously with Hashimoto's hypothyroidism, on levothyroxine therapy. She appears euthyroid. She was taking the first dose of calcium along with levothyroxine in the morning for the first 3 weeks after the surgery, but fortunately she stopped this so now she has been off the calcium for 1 month.  - We discussed about correct intake of levothyroxine, fasting, with water, separated by at least 30 minutes from breakfast, and separated by more than 4 hours from calcium, iron, multivitamins, acid reflux medications (PPIs). - will check thyroid tests today: TSH, free T4 - target TSH: LLN - If these are abnormal, she will need to return in 6-8 weeks for repeat labs - If these are normal, we will recheck them at next visit  - time spent with the patient: 1 hour, of which >50% was spent in obtaining information about her newly diagnosed thyroid cancer and also her hypothyroidism, reviewing her previous labs, evaluations, and treatments, counseling her about her conditions (please see the discussed topics above), and developing a plan to further investigate them; she had a number of questions which I addressed.  Component     Latest Ref Rng 08/18/2014  TSH     0.35 - 4.50 uIU/mL 13.02 (H)  Free T4     0.60 - 1.60 ng/dL 0.91  [name]     2.8 - 40.9 ng/mL 0.8 (L)  [name] Ab     <2 IU/mL 1   Tg low, despite not undergoing RAI tx. This is great! TSH high, likely 2/2 co-adm. Of calcium with it. Will continue same dose of LT4 and recheck in 5 weeks, if still high >> increase the dose of LT4.

## 2014-08-18 NOTE — Patient Instructions (Addendum)
Please stop at the lab.  Continue Levothyroxine 125 mcg daily.  Take the thyroid hormone every day, with water, >30 minutes before breakfast, separated by >4 hours from acid reflux medications, calcium, iron, multivitamins.  Please review [email or web address] website.

## 2014-08-19 LAB — THYROGLOBULIN LEVEL: Thyroglobulin: 0.8 ng/mL — ABNORMAL LOW (ref 2.8–40.9)

## 2014-08-19 LAB — THYROGLOBULIN ANTIBODY: THYROGLOBULIN AB: 1 [IU]/mL (ref <2)

## 2014-09-18 ENCOUNTER — Encounter: Payer: Self-pay | Admitting: Family Medicine

## 2014-09-24 ENCOUNTER — Other Ambulatory Visit (INDEPENDENT_AMBULATORY_CARE_PROVIDER_SITE_OTHER): Payer: BC Managed Care – PPO

## 2014-09-24 ENCOUNTER — Other Ambulatory Visit: Payer: BC Managed Care – PPO

## 2014-09-24 DIAGNOSIS — E890 Postprocedural hypothyroidism: Secondary | ICD-10-CM

## 2014-09-24 LAB — T4, FREE: FREE T4: 0.88 ng/dL (ref 0.60–1.60)

## 2014-09-24 LAB — TSH: TSH: 19.38 u[IU]/mL — AB (ref 0.35–4.50)

## 2014-09-27 ENCOUNTER — Other Ambulatory Visit: Payer: Self-pay

## 2014-09-27 DIAGNOSIS — E890 Postprocedural hypothyroidism: Secondary | ICD-10-CM

## 2014-09-27 MED ORDER — LEVOTHYROXINE SODIUM 150 MCG PO TABS: 150.0000 ug | ORAL_TABLET | Freq: Every day | ORAL | Status: DC

## 2014-11-15 ENCOUNTER — Ambulatory Visit (INDEPENDENT_AMBULATORY_CARE_PROVIDER_SITE_OTHER): Payer: BC Managed Care – PPO | Admitting: Internal Medicine

## 2014-11-15 ENCOUNTER — Encounter: Payer: Self-pay | Admitting: Internal Medicine

## 2014-11-15 VITALS — BP 104/62 | HR 64 | Temp 98.1°F | Resp 12 | Wt 167.0 lb

## 2014-11-15 DIAGNOSIS — C73 Malignant neoplasm of thyroid gland: Secondary | ICD-10-CM | POA: Diagnosis not present

## 2014-11-15 DIAGNOSIS — E890 Postprocedural hypothyroidism: Secondary | ICD-10-CM | POA: Diagnosis not present

## 2014-11-15 NOTE — Patient Instructions (Signed)
Continue Levothyroxine 150 mcg daily.  Take the thyroid hormone every day, with water, >30 minutes before breakfast, separated by >4 hours from acid reflux medications, calcium, iron, multivitamins.  Please come back for a follow-up appointment in 6 months.

## 2014-11-15 NOTE — Progress Notes (Signed)
Patient ID: Lauren Morton, female   DOB: 1985-04-03, 29 y.o.   MRN: [id number]   HPI  Lauren Morton is a 29 y.o.-year-old female, returning for f/u for papillary thyroid cancer and postsurgical hypothyroidism. Last visit 3 mo ago.  ThyCA hx: Pt. has been dx with goiter ~ 2006 >> thyroid U/S: ? Results but also dx'ed with Hashimoto's thyroiditis with hypothyroidism >> started LT4.  She started to see Dr Danise Mina 03/2014 >> new U/S (04/2014): small R thyroid nodule 1.4 x 1.3 x 1.1 cm, with microcalcifications >>  PTC in 04/2014 by FNA.   - 06/03/2014: Total thyroidectomy - Dr Harlow Asa. Path: Diagnosis 1. Thyroid, thyroidectomy, total thyroid, suture right superior pole - PAPILLARY THYROID CARCINOMA, 1.5 CM IN GREATEST DIMENSION. - MARGINS ARE NEGATIVE. - BACKGROUND FLORID LYMPHOCYTIC THYROIDITIS. - SEE ONCOLOGY TEMPLATE. 2. Lymph nodes, regional resection, central compartment - TEN BENIGN LYMPH NODES WITH NO TUMOR SEEN (0/10). Microscopic Comment 1. THYROID Specimen: Thyroid with central compartment lymph nodes. Procedure: Total thyroidectomy with limited central compartment lymph dissection. Specimen Integrity (intact/fragmented): Intact. Tumor focality: Unifocal. Dominant tumor: Maximum tumor size (cm): 1.5 cm. Tumor laterality: Tumor involves right superior pole. Histologic type (including subtype and/or unique features as applicable): Papillary thyroid carcinoma, conventional type. Tumor capsule: Tumor is partially encapsulated. Extrathyroidal extension: No. Margins: Negative. Lymph - Vascular invasion: Definitive lymph/vascular is not identified. Capsular invasion with degree of invasion if present: No capsular invasion identified. Lymph nodes: # examined 10; # positive 0. TNM code: pT1b, pN0. Non-neoplastic thyroid: Florid lymphocytic thyroiditis. (RH:ds 06/04/14)  Component     Latest Ref Rng 08/18/2014  TSH     0.35 - 4.50 uIU/mL 13.02 (H)  Free T4     0.60  - 1.60 ng/dL 0.91  [name]     2.8 - 40.9 ng/mL 0.8 (L)  [name] Ab     <2 IU/mL 1   Tg low, despite not undergoing RAI tx (not needed for a 1.5 cm tumor).  Pt denies feeling nodules in neck, hoarseness, dysphagia/odynophagia, SOB with lying down. She does feel like she has GERD >> pressure in lower neck, relieved by eructation.   For her postsurgical hypothyroidism, she is on Levothyroxine 125 >> 150 mcg, taken: - fasting - with water - separated by >30 min from b'fast  - no calcium, iron, PPIs, multivitamins   I reviewed pt's thyroid tests: Lab Results  Component Value Date   TSH 19.38* 09/24/2014   TSH 13.02* 08/18/2014   TSH 3.83 03/31/2014   TSH 1.25 03/25/2012   TSH 0.84 10/31/2010   TSH 1.55 05/02/2009   TSH 10.43* 04/29/2006   FREET4 0.88 09/24/2014   FREET4 0.91 08/18/2014   FREET4 0.77 03/31/2014   FREET4 0.6 04/29/2006    At the time of the TSH of 13, she was not taking the LT4 correctly, but together with calcium. She stopped calcium >> next TSH was even higher, at 19. We increased LT4 to 150 mcg then. She is taking it every day, but tells me she is taking it together with her OCPs. These do not contain iron.  Pt denies: - fatigue - weight gain (although she did have an 8 lbs weight gain per our scale) - constipation - dry skin - hair falling - depression  She has + FH of thyroid disorders in: MGM - Hashimoto's ds. No FH of thyroid cancer.  No h/o radiation tx to head or neck.  ROS: Constitutional: no weight gain/loss, no fatigue, no subjective hyperthermia/hypothermia  Eyes: no blurry vision, no xerophthalmia ENT: no sore throat, no nodules palpated in throat, no dysphagia/no odynophagia, no hoarseness Cardiovascular: no CP/SOB/palpitations/leg swelling Respiratory: no cough/SOB Gastrointestinal: no N/V/D/C Musculoskeletal: no muscle/joint aches Skin: no rashes Neurological: no tremors/numbness/tingling/dizziness  I reviewed pt's  medications, allergies, PMH, social hx, family hx, and changes were documented in the history of present illness. Otherwise, unchanged from my initial visit note.  Past Medical History  Diagnosis Date  . Hashimoto's thyroiditis     confirmed by biopsy s/p thyroidectomy  . Hypothyroidism, acquired, autoimmune   . Papillary thyroid carcinoma ([place]) 03/31/2014    s/p thyroidectomy, 10 negative LN  . CKD (chronic kidney disease) stage 1, GFR 90 ml/min or greater 09/2013    mild proteinuria [facility]) sees yearly   Past Surgical History  Procedure Laterality Date  . Wisdom tooth extraction      4  . Total thyroidectomy  06/2014    with limited central lymph node dissection ([place])  . Thyroidectomy N/A 06/03/2014    total - 1.5cm papillary thyroid carcinoma with negative surgical margins and 10/10 LN neg for mets; Armandina Gemma, MD  . Lymph node dissection N/A 06/03/2014    Procedure: LIMITED LYMPH NODE DISSECTION;  Surgeon: Armandina Gemma, MD;  Location: [facility];  Service: General;  Laterality: N/A;   Social History   Social History  . Marital Status: Married    Spouse Name: N/A  . Number of Children: 0   Occupational History  . School counselor   Social History Main Topics  . Smoking status: Never Smoker   . Smokeless tobacco: Never Used  . Alcohol Use: Yes     Comment: social use  . Drug Use: No   Social History Narrative   Caffeine: 1 soda at lunch   Lives with husband, 2 dogs and 1 pig, chickens   Occupation: Animal nutritionist   Edu: Masters in education   Activity: runs, has done 1/2 marathons   Diet: good water, good fruits/vegetables, red meat 2x/wk, fish 1x/wk   Current Outpatient Prescriptions on File Prior to Visit  Medication Sig Dispense Refill  . levothyroxine (SYNTHROID, LEVOTHROID) 150 MCG tablet Take 1 tablet (150 mcg total) by mouth daily. 90 tablet 3  . TRI-PREVIFEM 0.18/0.215/0.25 MG-35 MCG tablet Take 1 tablet by mouth daily.  11   No current  facility-administered medications on file prior to visit.   No Known Allergies Family History  Problem Relation Age of Onset  . Cancer Mother 53    breast cancer, s/p mastectomy  . Diabetes Father   . Coronary artery disease Maternal Grandfather     several MIs  . Stroke Neg Hx   . Cancer Maternal Grandmother     breast cancer s/p lumpectomy  . Thyroid disease Maternal Grandmother   . Alcohol abuse Paternal Grandmother    PE: BP 104/62 mmHg  Pulse 64  Temp(Src) 98.1 F (36.7 C) (Oral)  Resp 12  Wt 167 lb (75.751 kg)  SpO2 97% Body mass index is 22.97 kg/(m^2). Wt Readings from Last 3 Encounters:  11/15/14 167 lb (75.751 kg)  08/18/14 159 lb (72.122 kg)  06/03/14 160 lb (72.576 kg)   Constitutional: normal weight, in NAD Eyes: PERRLA, EOMI, no exophthalmos ENT: moist mucous membranes, no thyroid masses, cervical scar healing, no dysesthesia, no cervical lymphadenopathy Cardiovascular: RRR, No MRG Respiratory: CTA B Gastrointestinal: abdomen soft, NT, ND, BS+ Musculoskeletal: no deformities, strength intact in all 4 Skin: moist, warm, no rashes Neurological: no tremor  with outstretched hands, DTR normal in all 4  ASSESSMENT: 1. Thyroid cancer - see HPI  2. Postsurgical Hypothyroidism  PLAN:  1. Thyroid cancer - papillary - since the tumor was 1.5 cm, radioactive iodine was not definitely indicated for post-op thyroid remnant ablation. We will follow her by initially annual neck ultrasound, and then spread these apart. We also discussed about the possible use of thyroglobulin, ideally having stable or declining levels after thyroidectomy. If this increases >> imaging tests are indicated. Last thyroglobulin and Tg antibodies reviewed with the pt >> Tg low, which is grea - we can follow the levels as many times the thyroglobulin can become undetectable even without radioactive iodine treatment. - she has some neck pressure after the surgery, relieved by eructation. She will  see Dr Harlow Asa at the end of the week and will ask him about this.   2. Patient with h/o total thyroidectomy for cancer, previously with Hashimoto's hypothyroidism, on levothyroxine therapy. She appears euthyroid, but I am not sure  - We discussed about correct intake of levothyroxine, fasting, with water, separated by at least 30 minutes from breakfast, and separated by more than 4 hours from calcium, iron, multivitamins, acid reflux medications (PPIs). She is taking it correctly but along with OCPs. We may need to separate it from these,if today's labs are not improved - will check thyroid tests today: TSH, free T4 - target TSH: LLN - If these are abnormal, she will need to return in 6-8 weeks for repeat labs - If these are normal, we will recheck them at next visit   Office Visit on 11/15/2014  Component Date Value Ref Range Status  . TSH 11/15/2014 1.67  0.35 - 4.50 uIU/mL Final  . Free T4 11/15/2014 1.11  0.60 - 1.60 ng/dL Final   TSH finally normal. I would like to repeat the TFTs in 2 months to see if TSH decreased further, as I would like this a little lower in the 1st year after thyroidectomy.

## 2014-11-16 LAB — T4, FREE: Free T4: 1.11 ng/dL (ref 0.60–1.60)

## 2014-11-16 LAB — TSH: TSH: 1.67 u[IU]/mL (ref 0.35–4.50)

## 2015-02-18 ENCOUNTER — Ambulatory Visit (INDEPENDENT_AMBULATORY_CARE_PROVIDER_SITE_OTHER): Payer: BC Managed Care – PPO | Admitting: Internal Medicine

## 2015-02-18 ENCOUNTER — Encounter: Payer: Self-pay | Admitting: Internal Medicine

## 2015-02-18 VITALS — BP 102/60 | HR 63 | Temp 98.2°F | Resp 12 | Wt 163.0 lb

## 2015-02-18 DIAGNOSIS — E890 Postprocedural hypothyroidism: Secondary | ICD-10-CM | POA: Diagnosis not present

## 2015-02-18 DIAGNOSIS — C73 Malignant neoplasm of thyroid gland: Secondary | ICD-10-CM

## 2015-02-18 NOTE — Progress Notes (Signed)
Patient ID: Lauren Morton, female   DOB: 12-21-1985, 30 y.o.   MRN: [id number]   HPI  Lauren Morton is a 30 y.o.-year-old female, returning for f/u for papillary thyroid cancer and postsurgical hypothyroidism. Last visit 3 mo ago.  ThyCA hx: Pt. has been dx with goiter ~ 2006 >> thyroid U/S: ? Results but also dx'ed with Hashimoto's thyroiditis with hypothyroidism >> started LT4.  She started to see Dr Danise Mina 03/2014 >> new U/S (04/2014): small R thyroid nodule 1.4 x 1.3 x 1.1 cm, with microcalcifications >>  PTC in 04/2014 by FNA.   - 06/03/2014: Total thyroidectomy - Dr Harlow Asa. Path: Diagnosis 1. Thyroid, thyroidectomy, total thyroid, suture right superior pole - PAPILLARY THYROID CARCINOMA, 1.5 CM IN GREATEST DIMENSION. - MARGINS ARE NEGATIVE. - BACKGROUND FLORID LYMPHOCYTIC THYROIDITIS. - SEE ONCOLOGY TEMPLATE. 2. Lymph nodes, regional resection, central compartment - TEN BENIGN LYMPH NODES WITH NO TUMOR SEEN (0/10). Microscopic Comment 1. THYROID Specimen: Thyroid with central compartment lymph nodes. Procedure: Total thyroidectomy with limited central compartment lymph dissection. Specimen Integrity (intact/fragmented): Intact. Tumor focality: Unifocal. Dominant tumor: Maximum tumor size (cm): 1.5 cm. Tumor laterality: Tumor involves right superior pole. Histologic type (including subtype and/or unique features as applicable): Papillary thyroid carcinoma, conventional type. Tumor capsule: Tumor is partially encapsulated. Extrathyroidal extension: No. Margins: Negative. Lymph - Vascular invasion: Definitive lymph/vascular is not identified. Capsular invasion with degree of invasion if present: No capsular invasion identified. Lymph nodes: # examined 10; # positive 0. TNM code: pT1b, pN0. Non-neoplastic thyroid: Florid lymphocytic thyroiditis. (RH:ds 06/04/14)  Component     Latest Ref Rng 08/18/2014  [name]     2.8 - 40.9 ng/mL 0.8 (L)   [name] Ab     <2 IU/mL 1   Tg low, despite not undergoing RAI tx (not needed for a 1.5 cm tumor).  Pt denies feeling nodules in neck, hoarseness, dysphagia/odynophagia, SOB with lying down.   For her postsurgical hypothyroidism, she is on Levothyroxine 150 mcg, taken: - fasting - with water - separated by >30 min from b'fast  - no PPI, iron, multivitamins  - + Tums in the evening  I reviewed pt's thyroid tests: Lab Results  Component Value Date   TSH 1.67 11/15/2014   TSH 19.38* 09/24/2014   TSH 13.02* 08/18/2014   TSH 3.83 03/31/2014   TSH 1.25 03/25/2012   TSH 0.84 10/31/2010   TSH 1.55 05/02/2009   TSH 10.43* 04/29/2006   FREET4 1.11 11/15/2014   FREET4 0.88 09/24/2014   FREET4 0.91 08/18/2014   FREET4 0.77 03/31/2014   FREET4 0.6 04/29/2006    At the time of the TSH of 13, she was not taking the LT4 correctly, but together with calcium. She stopped calcium >> next TSH was even higher, at 19. We increased LT4 to 150 mcg then. She is taking it every day, but tells me she is taking it together with her OCPs. These do not contain iron.  Pt denies: - fatigue - weight gain or loss - constipation - dry skin - hair falling - depression  She has + FH of thyroid disorders in: MGM - Hashimoto's ds. No FH of thyroid cancer.  No h/o radiation tx to head or neck.  ROS: Constitutional: no weight gain/loss, no fatigue, no subjective hyperthermia/hypothermia Eyes: no blurry vision, no xerophthalmia ENT: no sore throat, no nodules palpated in throat, no dysphagia/no odynophagia, no hoarseness Cardiovascular: no CP/SOB/palpitations/leg swelling Respiratory: no cough/SOB Gastrointestinal: no N/V/D/C Musculoskeletal: no muscle/joint aches Skin: no  rashes Neurological: no tremors/numbness/tingling/dizziness  I reviewed pt's medications, allergies, PMH, social hx, family hx, and changes were documented in the history of present illness. Otherwise, unchanged from my  initial visit note.  Past Medical History  Diagnosis Date  . Hashimoto's thyroiditis     confirmed by biopsy s/p thyroidectomy  . Hypothyroidism, acquired, autoimmune   . Papillary thyroid carcinoma ([place]) 03/31/2014    s/p thyroidectomy, 10 negative LN  . CKD (chronic kidney disease) stage 1, GFR 90 ml/min or greater 09/2013    mild proteinuria [facility]) sees yearly   Past Surgical History  Procedure Laterality Date  . Wisdom tooth extraction      4  . Total thyroidectomy  06/2014    with limited central lymph node dissection ([place])  . Thyroidectomy N/A 06/03/2014    total - 1.5cm papillary thyroid carcinoma with negative surgical margins and 10/10 LN neg for mets; Armandina Gemma, MD  . Lymph node dissection N/A 06/03/2014    Procedure: LIMITED LYMPH NODE DISSECTION;  Surgeon: Armandina Gemma, MD;  Location: [facility];  Service: General;  Laterality: N/A;   Social History   Social History  . Marital Status: Married    Spouse Name: N/A  . Number of Children: 0   Occupational History  . School counselor   Social History Main Topics  . Smoking status: Never Smoker   . Smokeless tobacco: Never Used  . Alcohol Use: Yes     Comment: social use  . Drug Use: No   Social History Narrative   Caffeine: 1 soda at lunch   Lives with husband, 2 dogs and 1 pig, chickens   Occupation: Animal nutritionist   Edu: Masters in education   Activity: runs, has done 1/2 marathons   Diet: good water, good fruits/vegetables, red meat 2x/wk, fish 1x/wk   Current Outpatient Prescriptions on File Prior to Visit  Medication Sig Dispense Refill  . levothyroxine (SYNTHROID, LEVOTHROID) 150 MCG tablet Take 1 tablet (150 mcg total) by mouth daily. 90 tablet 3  . TRI-PREVIFEM 0.18/0.215/0.25 MG-35 MCG tablet Take 1 tablet by mouth daily.  11   No current facility-administered medications on file prior to visit.   No Known Allergies Family History  Problem Relation Age of Onset  . Cancer Mother 68    breast  cancer, s/p mastectomy  . Diabetes Father   . Coronary artery disease Maternal Grandfather     several MIs  . Stroke Neg Hx   . Cancer Maternal Grandmother     breast cancer s/p lumpectomy  . Thyroid disease Maternal Grandmother   . Alcohol abuse Paternal Grandmother    PE: BP 102/60 mmHg  Pulse 63  Temp(Src) 98.2 F (36.8 C) (Oral)  Resp 12  Wt 163 lb (73.936 kg)  SpO2 99% Body mass index is 22.42 kg/(m^2). Wt Readings from Last 3 Encounters:  02/18/15 163 lb (73.936 kg)  11/15/14 167 lb (75.751 kg)  08/18/14 159 lb (72.122 kg)   Constitutional: normal weight, in NAD Eyes: PERRLA, EOMI, no exophthalmos ENT: moist mucous membranes, no thyroid masses, cervical scar healing, no dysesthesia, no cervical lymphadenopathy Cardiovascular: RRR, No MRG Respiratory: CTA B Gastrointestinal: abdomen soft, NT, ND, BS+ Musculoskeletal: no deformities, strength intact in all 4 Skin: moist, warm, no rashes Neurological: no tremor with outstretched hands, DTR normal in all 4  ASSESSMENT: 1. Thyroid cancer - see HPI  2. Postsurgical Hypothyroidism  PLAN:  1. Thyroid cancer - papillary - since the tumor was 1.5 cm, radioactive  iodine was not definitely indicated for post-op thyroid remnant ablation. We will follow her by initially annual neck ultrasound, and then spread these apart. We will also use of thyroglobulin, ideally having stable or declining levels after thyroidectomy. If this increases >> imaging tests are indicated. Last thyroglobulin and Tg antibodies reviewed with the pt >> Tg low, will recheck today - will check a thyroid U/S in June  2. Patient with h/o total thyroidectomy, previously with Hashimoto's hypothyroidism, on levothyroxine therapy. She appears euthyroid - We discussed about correct intake of levothyroxine, fasting, with water, separated by at least 30 minutes from breakfast, and separated by more than 4 hours from calcium, iron, multivitamins, acid reflux  medications (PPIs). She is taking it correctly. - will check thyroid tests today: TSH, free T4 - target TSH: LLN - If these are abnormal, she will need to return in 6-8 weeks for repeat labs - If these are normal, we will recheck them at next visit, in 1 year  Office Visit on 02/18/2015  Component Date Value Ref Range Status  . Free T4 02/18/2015 0.91  0.60 - 1.60 ng/dL Final  . TSH 02/18/2015 7.52* 0.35 - 4.50 uIU/mL Final  . [name] 02/18/2015 0.8* 2.8 - 40.9 ng/mL Final   Comment: [name] antibodies (TGAb) interfere with [name] (TG) assays; therefore, [name] antibody (TGAb) assay should always be performed in conjunction with a [name] (TG) assay.   This test was performed using the Beckman Coulter chemiluminescent method.  Values obtained from different assay methods cannot be used interchangeably.  [name] levels, regardless of value, should not be interpreted as absolute evidence of the presence or absence of disease.   . [name] Ab 02/18/2015 <1  <2 IU/mL Final   Will check with pt if she did not miss doses, but may need to increase LT4 to 175 mcg.

## 2015-02-18 NOTE — Patient Instructions (Signed)
Please stop at the lab.  Please continue Levothyroxine 150 mcg daily.  Take the thyroid hormone every day, with water, at least 30 minutes before breakfast, separated by at least 4 hours from: - acid reflux medications - calcium - iron - multivitamins  Please call me in June to order the neck U/S.  Please return in 1 year.

## 2015-02-21 LAB — T4, FREE: FREE T4: 0.91 ng/dL (ref 0.60–1.60)

## 2015-02-21 LAB — TSH: TSH: 7.52 u[IU]/mL — AB (ref 0.35–4.50)

## 2015-02-21 LAB — THYROGLOBULIN ANTIBODY: Thyroglobulin Ab: <1 IU/mL (ref <2)

## 2015-02-21 LAB — THYROGLOBULIN LEVEL: Thyroglobulin: 0.8 ng/mL — ABNORMAL LOW (ref 2.8–40.9)

## 2015-02-22 ENCOUNTER — Other Ambulatory Visit: Payer: Self-pay | Admitting: *Deleted

## 2015-02-22 MED ORDER — LEVOTHYROXINE SODIUM 175 MCG PO TABS: 175.0000 ug | ORAL_TABLET | Freq: Every day | ORAL | Status: DC

## 2015-04-05 ENCOUNTER — Ambulatory Visit (INDEPENDENT_AMBULATORY_CARE_PROVIDER_SITE_OTHER): Payer: BC Managed Care – PPO | Admitting: Family Medicine

## 2015-04-05 ENCOUNTER — Encounter: Payer: Self-pay | Admitting: Family Medicine

## 2015-04-05 VITALS — BP 124/74 | HR 63 | Temp 98.1°F | Wt 160.0 lb

## 2015-04-05 DIAGNOSIS — L03115 Cellulitis of right lower limb: Secondary | ICD-10-CM

## 2015-04-05 MED ORDER — CEPHALEXIN 500 MG PO CAPS: 500.0000 mg | ORAL_CAPSULE | Freq: Two times a day (BID) | ORAL | Status: DC

## 2015-04-05 NOTE — Progress Notes (Signed)
Garret Reddish, MD  Subjective:  Lauren Morton is a 30 y.o. year old very pleasant female patient who presents for/with See problem oriented charting ROS- see ros below  Past Medical History-  Patient Active Problem List   Diagnosis Date Noted  . Postsurgical hypothyroidism 08/18/2014  . Health maintenance examination 03/31/2014  . Papillary thyroid carcinoma ([place]) 03/31/2014  . CKD (chronic kidney disease) stage 1, GFR 90 ml/min or greater 09/01/2013  . History of Hashimoto thyroiditis 03/24/2013    Medications- reviewed and updated Current Outpatient Prescriptions  Medication Sig Dispense Refill  . levothyroxine (SYNTHROID, LEVOTHROID) 175 MCG tablet Take 1 tablet (175 mcg total) by mouth daily before breakfast. 45 tablet 2  . TRI-PREVIFEM 0.18/0.215/0.25 MG-35 MCG tablet Take 1 tablet by mouth daily.  11   Objective: BP 124/74 mmHg  Pulse 63  Temp(Src) 98.1 F (36.7 C)  Wt 160 lb (72.576 kg) Gen: NAD, resting comfortably and well appearing, no mucus membrane involvement CV: RRR no murmurs rubs or gallops Lungs: CTAB no crackles, wheeze, rhonchi Ext: no pretibial, edema Skin: warm, dry On back of right upper leg she has a 9x 5 are of erythema with warmth and mild tenderness to touch. No target lesion. Central area somewhat indurated with no clear fluctuance to suggest abscess.  Neuro: grossly normal, moves all extremities  Assessment/Plan:  Cellulitis  S:yardwork on Sunday, small red bump after working in yard that day. ? Bugbite. Then yesterday, noted a large red lesion on inner to back side of right upper leg.  Very itchy. Minimal burning pain.Got home and seems to be expanding, growing in size.  Warm to touch. Hydrocortisone cream to help with itch only treatment tried. Did have tick on right anterior leg on Friday but not on back of leg.  ROS- no fever, chills, nausea, vomiting. No new medication or contacts A/P: Unclear entry point but patient has developed a  cellulitis on back of right upper leg. Will treat with keflex 500mg  for 7 days. Red flags/return precautions discussed. Had planned to update Tdap primarily because she is overdue but this was not done as intended before patient left. Think likelihood of tetanus from original entry point very low. No systemic signs to suggest RMSF or Lyme and did not think prophylactic treatment was indicated.   Meds ordered this encounter  Medications  . cephALEXin (KEFLEX) 500 MG capsule    Sig: Take 1 capsule (500 mg total) by mouth 2 (two) times daily.    Dispense:  14 capsule    Refill:  0

## 2015-04-05 NOTE — Patient Instructions (Signed)
Should be starting to get better within 48 hours, if not please return to care. Can continue hydrocortisone cream for the itch. If fevers, chills, signs of being ill overall not just locally- then return to care  Cellulitis Cellulitis is an infection of the skin and the tissue beneath it. The infected area is usually red and tender. Cellulitis occurs most often in the arms and lower legs.  CAUSES  Cellulitis is caused by bacteria that enter the skin through cracks or cuts in the skin. The most common types of bacteria that cause cellulitis are staphylococci and streptococci. SIGNS AND SYMPTOMS   Redness and warmth.  Swelling.  Tenderness or pain.  Fever. DIAGNOSIS  Your health care provider can usually determine what is wrong based on a physical exam. Blood tests may also be done. TREATMENT  Treatment usually involves taking an antibiotic medicine. HOME CARE INSTRUCTIONS   Take your antibiotic medicine as directed by your health care provider. Finish the antibiotic even if you start to feel better.  Keep the infected arm or leg elevated to reduce swelling.  Apply a warm cloth to the affected area up to 4 times per day to relieve pain.  Take medicines only as directed by your health care provider.  Keep all follow-up visits as directed by your health care provider. SEEK MEDICAL CARE IF:   You notice red streaks coming from the infected area.  Your red area gets larger or turns dark in color.  Your bone or joint underneath the infected area becomes painful after the skin has healed.  Your infection returns in the same area or another area.  You notice a swollen bump in the infected area.  You develop new symptoms.  You have a fever. SEEK IMMEDIATE MEDICAL CARE IF:   You feel very sleepy.  You develop vomiting or diarrhea.  You have a general ill feeling (malaise) with muscle aches and pains.   This information is not intended to replace advice given to you by your  health care provider. Make sure you discuss any questions you have with your health care provider.   Document Released: 09/27/2004 Document Revised: 09/08/2014 Document Reviewed: 03/05/2011 Elsevier Interactive Patient Education [employer].

## 2015-05-05 ENCOUNTER — Other Ambulatory Visit (INDEPENDENT_AMBULATORY_CARE_PROVIDER_SITE_OTHER): Payer: BC Managed Care – PPO

## 2015-05-05 DIAGNOSIS — E890 Postprocedural hypothyroidism: Secondary | ICD-10-CM

## 2015-05-05 LAB — T4, FREE: FREE T4: 1.13 ng/dL (ref 0.60–1.60)

## 2015-05-05 LAB — TSH: TSH: 3.26 u[IU]/mL (ref 0.35–4.50)

## 2015-07-29 ENCOUNTER — Telehealth: Payer: Self-pay | Admitting: Family Medicine

## 2015-07-29 NOTE — Telephone Encounter (Signed)
Message left advising patient that paperwork was up front for pick up.

## 2015-07-29 NOTE — Telephone Encounter (Signed)
Pt needs her immunization record and most recent health assessment for graduate school.  Pt will pick it up  cb number is [phone]

## 2015-08-21 ENCOUNTER — Other Ambulatory Visit: Payer: Self-pay | Admitting: Internal Medicine

## 2015-11-23 ENCOUNTER — Other Ambulatory Visit: Payer: Self-pay | Admitting: Internal Medicine

## 2015-12-02 ENCOUNTER — Encounter: Payer: Self-pay | Admitting: Family Medicine

## 2015-12-02 ENCOUNTER — Ambulatory Visit (INDEPENDENT_AMBULATORY_CARE_PROVIDER_SITE_OTHER): Payer: BC Managed Care – PPO | Admitting: Family Medicine

## 2015-12-02 VITALS — BP 122/76 | HR 61 | Temp 98.0°F | Wt 157.2 lb

## 2015-12-02 DIAGNOSIS — Z23 Encounter for immunization: Secondary | ICD-10-CM

## 2015-12-02 DIAGNOSIS — B9789 Other viral agents as the cause of diseases classified elsewhere: Secondary | ICD-10-CM | POA: Diagnosis not present

## 2015-12-02 DIAGNOSIS — J069 Acute upper respiratory infection, unspecified: Secondary | ICD-10-CM | POA: Diagnosis not present

## 2015-12-02 NOTE — Patient Instructions (Signed)
For nasal congestion you can use Afrin nasal spray for 3 days max, Sudafed, saline nasal spray (generic is fine for all). For cough you can try Delsym. Drink enough fluids to make your urine light yellow. For fever/chill/muscle aches you can take over the counter acetaminophen or ibuprofen.  Please come back in if you are not better in 5-7 days or if you develop wheezing, shortness of breath or persistent vomiting.   

## 2015-12-02 NOTE — Progress Notes (Signed)
Pre visit review using our clinic review tool, if applicable. No additional management support is needed unless otherwise documented below in the visit note. 

## 2015-12-02 NOTE — Progress Notes (Signed)
Subjective:    Patient ID: Lauren Morton, female    DOB: [birth year], 30 y.o.   MRN: [id number]  HPI This is a 30 yo female who presents today with cough and congestion for several days. Started with sore throat and hoarseness that are now better. Has had some green sputum and green nasal drainage. No fever. No ear pain. No chest tightness or wheeze or SOB. Has gargled with warm salt water, taken immune boosters, saline nasal spray. Most bothersome symptom is nasal congestion. Pain under and over eyes. Took a sinus headache medicine yesterday which seemed to help. Feels a little better today.    Past Medical History:  Diagnosis Date  . CKD (chronic kidney disease) stage 1, GFR 90 ml/min or greater 09/2013   mild proteinuria [facility]) sees yearly  . Hashimoto's thyroiditis    confirmed by biopsy s/p thyroidectomy  . Hypothyroidism, acquired, autoimmune   . Papillary thyroid carcinoma ([place]) 03/31/2014   s/p thyroidectomy, 10 negative LN   Past Surgical History:  Procedure Laterality Date  . LYMPH NODE DISSECTION N/A 06/03/2014   Procedure: LIMITED LYMPH NODE DISSECTION;  Surgeon: Armandina Gemma, MD;  Location: [facility];  Service: General;  Laterality: N/A;  . THYROIDECTOMY N/A 06/03/2014   total - 1.5cm papillary thyroid carcinoma with negative surgical margins and 10/10 LN neg for mets; Armandina Gemma, MD  . TOTAL THYROIDECTOMY  06/2014   with limited central lymph node dissection ([place])  . WISDOM TOOTH EXTRACTION     4   Family History  Problem Relation Age of Onset  . Cancer Mother 88    breast cancer, s/p mastectomy  . Diabetes Father   . Coronary artery disease Maternal Grandfather     several MIs  . Stroke Neg Hx   . Cancer Maternal Grandmother     breast cancer s/p lumpectomy  . Thyroid disease Maternal Grandmother   . Alcohol abuse Paternal Grandmother    Social History  Substance Use Topics  . Smoking status: Never Smoker  . Smokeless tobacco: Never Used  . Alcohol  use Yes     Comment: social use      Review of Systems Per HPI    Objective:   Physical Exam  Constitutional: She is oriented to person, place, and time. She appears well-developed and well-nourished. No distress.  HENT:  Head: Normocephalic and atraumatic.  Right Ear: Tympanic membrane, external ear and ear canal normal.  Left Ear: Tympanic membrane, external ear and ear canal normal.  Nose: Mucosal edema and rhinorrhea present. Right sinus exhibits no maxillary sinus tenderness and no frontal sinus tenderness. Left sinus exhibits no maxillary sinus tenderness and no frontal sinus tenderness.  Mouth/Throat: Uvula is midline and oropharynx is clear and moist.  Neck: Normal range of motion. Neck supple.  Well healed thyroidectomy scar.   Cardiovascular: Normal rate, regular rhythm and normal heart sounds.   Pulmonary/Chest: Effort normal and breath sounds normal.  Lymphadenopathy:    She has no cervical adenopathy.  Neurological: She is alert and oriented to person, place, and time.  Skin: Skin is warm and dry. She is not diaphoretic.  Psychiatric: She has a normal mood and affect. Her behavior is normal. Judgment and thought content normal.  Vitals reviewed.        BP 122/76   Pulse 61   Temp 98 F (36.7 C) (Oral)   Wt 157 lb 4 oz (71.3 kg)   LMP 11/04/2015   SpO2 98%   BMI  21.63 kg/m  Wt Readings from Last 3 Encounters:  12/02/15 157 lb 4 oz (71.3 kg)  04/05/15 160 lb (72.6 kg)  02/18/15 163 lb (73.9 kg)    Assessment & Plan:  1. Need for influenza vaccination - Flu Vaccine QUAD 36+ mos PF IM (Fluarix & Fluzone Quad PF)  2. Viral URI - Patient feeling better today, afebrile, no worrisome findings on exam Patient Instructions  For nasal congestion you can use Afrin nasal spray for 3 days max, Sudafed, saline nasal spray (generic is fine for all). For cough you can try Delsym. Drink enough fluids to make your urine light yellow. For fever/chill/muscle aches  you can take over the counter acetaminophen or ibuprofen.  Please come back in if you are not better in 5-7 days or if you develop wheezing, shortness of breath or persistent vomiting.       Clarene Reamer, FNP-BC  [facility]  12/02/2015 4:34 PM

## 2015-12-12 ENCOUNTER — Other Ambulatory Visit: Payer: Self-pay | Admitting: Surgery

## 2015-12-12 DIAGNOSIS — C73 Malignant neoplasm of thyroid gland: Secondary | ICD-10-CM

## 2015-12-23 ENCOUNTER — Other Ambulatory Visit: Payer: BC Managed Care – PPO

## 2015-12-28 ENCOUNTER — Ambulatory Visit
Admission: RE | Admit: 2015-12-28 | Discharge: 2015-12-28 | Disposition: A | Discharge status: Home / Self Care | Payer: BC Managed Care – PPO | Source: Ambulatory Visit | Attending: Surgery | Admitting: Surgery

## 2015-12-28 DIAGNOSIS — C73 Malignant neoplasm of thyroid gland: Secondary | ICD-10-CM

## 2016-01-08 ENCOUNTER — Other Ambulatory Visit: Payer: Self-pay | Admitting: Internal Medicine

## 2016-01-19 ENCOUNTER — Ambulatory Visit: Payer: BC Managed Care – PPO | Admitting: Internal Medicine

## 2016-01-25 ENCOUNTER — Encounter: Payer: Self-pay | Admitting: Internal Medicine

## 2016-01-25 ENCOUNTER — Ambulatory Visit (INDEPENDENT_AMBULATORY_CARE_PROVIDER_SITE_OTHER): Payer: BC Managed Care – PPO | Admitting: Internal Medicine

## 2016-01-25 VITALS — BP 110/72 | HR 86 | Wt 161.0 lb

## 2016-01-25 DIAGNOSIS — C73 Malignant neoplasm of thyroid gland: Secondary | ICD-10-CM | POA: Diagnosis not present

## 2016-01-25 DIAGNOSIS — E890 Postprocedural hypothyroidism: Secondary | ICD-10-CM

## 2016-01-25 LAB — T4, FREE: Free T4: 0.95 ng/dL (ref 0.60–1.60)

## 2016-01-25 LAB — TSH: TSH: 0.37 u[IU]/mL (ref 0.35–4.50)

## 2016-01-25 NOTE — Progress Notes (Signed)
Patient ID: Lauren Morton, female   DOB: 1985/04/03, 31 y.o.   MRN: [id number]   HPI  Lauren Morton is a 31 y.o.-year-old female, returning for f/u for papillary thyroid cancer and postsurgical hypothyroidism. Last visit ~1 year ago.  Reviewed ThyCA hx: Pt. has been dx with goiter ~ 2006 >> thyroid U/S: ? Results but also dx'ed with Hashimoto's thyroiditis with hypothyroidism >> started LT4.  She started to see Dr Danise Mina 03/2014 >> new U/S (04/2014): small R thyroid nodule 1.4 x 1.3 x 1.1 cm, with microcalcifications >>  PTC in 04/2014 by FNA.   - 06/03/2014: Total thyroidectomy - Dr Harlow Asa. Path: Diagnosis 1. Thyroid, thyroidectomy, total thyroid, suture right superior pole - PAPILLARY THYROID CARCINOMA, 1.5 CM IN GREATEST DIMENSION. - MARGINS ARE NEGATIVE. - BACKGROUND FLORID LYMPHOCYTIC THYROIDITIS. - SEE ONCOLOGY TEMPLATE. 2. Lymph nodes, regional resection, central compartment - TEN BENIGN LYMPH NODES WITH NO TUMOR SEEN (0/10). Microscopic Comment 1. THYROID Specimen: Thyroid with central compartment lymph nodes. Procedure: Total thyroidectomy with limited central compartment lymph dissection. Specimen Integrity (intact/fragmented): Intact. Tumor focality: Unifocal. Dominant tumor: Maximum tumor size (cm): 1.5 cm. Tumor laterality: Tumor involves right superior pole. Histologic type (including subtype and/or unique features as applicable): Papillary thyroid carcinoma, conventional type. Tumor capsule: Tumor is partially encapsulated. Extrathyroidal extension: No. Margins: Negative. Lymph - Vascular invasion: Definitive lymph/vascular is not identified. Capsular invasion with degree of invasion if present: No capsular invasion identified. Lymph nodes: # examined 10; # positive 0. TNM code: pT1b, pN0. Non-neoplastic thyroid: [place] lymphocytic thyroiditis. (RH:ds 06/04/14)  Component     Latest Ref Rng & Units 08/18/2014 02/18/2015  [name]     2.8 -  40.9 ng/mL 0.8 (L) 0.8 (L)  [name] Ab     <2 IU/mL 1 <1   Tg low, despite not undergoing RAI tx (not needed for a 1.5 cm tumor).  12/2015: Neck U/S:  Post total thyroidectomy with minimal amount (approximately 1 cm) of residual nodular tissue within the right thyroid lobectomy resection bed potentially indicative of residual thyroid tissue though residual/locally recurrent disease is not excluded on the basis this examination. This residual tissue could be amenable to ultrasound-guided fine-needle aspiration as clinically indicated.  Pt denies feeling nodules in neck, hoarseness, dysphagia/odynophagia, SOB with lying down.   For her postsurgical hypothyroidism, she is on Levothyroxine 150 >> 175 mcg, taken: - fasting - with water - separated by >30 min from b'fast  - no PPI, iron, multivitamins  - no Tums anymore  Drinks coffee + cream 45 min later.  I reviewed pt's thyroid tests: Lab Results  Component Value Date   TSH 3.26 05/05/2015   TSH 7.52 (H) 02/18/2015   TSH 1.67 11/15/2014   TSH 19.38 (H) 09/24/2014   TSH 13.02 (H) 08/18/2014   TSH 3.83 03/31/2014   TSH 1.25 03/25/2012   TSH 0.84 10/31/2010   TSH 1.55 05/02/2009   TSH 10.43 (H) 04/29/2006   FREET4 1.13 05/05/2015   FREET4 0.91 02/18/2015   FREET4 1.11 11/15/2014   FREET4 0.88 09/24/2014   FREET4 0.91 08/18/2014   FREET4 0.77 03/31/2014   FREET4 0.6 04/29/2006    Pt denies: - fatigue - weight gain or loss - constipation - dry skin - hair falling - depression  She has + FH of thyroid disorders in: MGM - Hashimoto's ds. No FH of thyroid cancer.  No h/o radiation tx to head or neck.  ROS: Constitutional: no weight gain/loss, no fatigue, no subjective hyperthermia/hypothermia Eyes:  no blurry vision, no xerophthalmia ENT: no sore throat, no nodules palpated in throat, no dysphagia/no odynophagia, no hoarseness Cardiovascular: no CP/SOB/palpitations/leg swelling Respiratory: no  cough/SOB Gastrointestinal: no N/V/D/C Musculoskeletal: no muscle/joint aches Skin: no rashes Neurological: no tremors/numbness/tingling/dizziness  I reviewed pt's medications, allergies, PMH, social hx, family hx, and changes were documented in the history of present illness. Otherwise, unchanged from my initial visit note.  Past Medical History:  Diagnosis Date  . CKD (chronic kidney disease) stage 1, GFR 90 ml/min or greater 09/2013   mild proteinuria [facility]) sees yearly  . Hashimoto's thyroiditis    confirmed by biopsy s/p thyroidectomy  . Hypothyroidism, acquired, autoimmune   . Papillary thyroid carcinoma ([place]) 03/31/2014   s/p thyroidectomy, 10 negative LN   Past Surgical History:  Procedure Laterality Date  . LYMPH NODE DISSECTION N/A 06/03/2014   Procedure: LIMITED LYMPH NODE DISSECTION;  Surgeon: Armandina Gemma, MD;  Location: [facility];  Service: General;  Laterality: N/A;  . THYROIDECTOMY N/A 06/03/2014   total - 1.5cm papillary thyroid carcinoma with negative surgical margins and 10/10 LN neg for mets; Armandina Gemma, MD  . TOTAL THYROIDECTOMY  06/2014   with limited central lymph node dissection ([place])  . WISDOM TOOTH EXTRACTION     4   Social History   Social History  . Marital Status: Married    Spouse Name: N/A  . Number of Children: 0   Occupational History  . School counselor   Social History Main Topics  . Smoking status: Never Smoker   . Smokeless tobacco: Never Used  . Alcohol Use: Yes     Comment: social use  . Drug Use: No   Social History Narrative   Caffeine: 1 soda at lunch   Lives with husband, 2 dogs and 1 pig, chickens   Occupation: Animal nutritionist   Edu: Masters in education   Activity: runs, has done 1/2 marathons   Diet: good water, good fruits/vegetables, red meat 2x/wk, fish 1x/wk   Current Outpatient Prescriptions on File Prior to Visit  Medication Sig Dispense Refill  . levothyroxine (SYNTHROID, LEVOTHROID) 175 MCG tablet TAKE 1  TABLET (175 [place]) BY MOUTH DAILY BEFORE BREAKFAST. 45 tablet 0  . TRI-PREVIFEM 0.18/0.215/0.25 MG-35 MCG tablet Take 1 tablet by mouth daily.  11   No current facility-administered medications on file prior to visit.    No Known Allergies Family History  Problem Relation Age of Onset  . Cancer Mother 24    breast cancer, s/p mastectomy  . Diabetes Father   . Coronary artery disease Maternal Grandfather     several MIs  . Stroke Neg Hx   . Cancer Maternal Grandmother     breast cancer s/p lumpectomy  . Thyroid disease Maternal Grandmother   . Alcohol abuse Paternal Grandmother    PE: BP 110/72 (BP Location: Left Arm, Patient Position: Sitting)   Pulse 86   Wt 161 lb (73 kg)   LMP 01/01/2016   SpO2 98%   BMI 22.14 kg/m  Body mass index is 22.14 kg/m. Wt Readings from Last 3 Encounters:  01/25/16 161 lb (73 kg)  12/02/15 157 lb 4 oz (71.3 kg)  04/05/15 160 lb (72.6 kg)   Constitutional: normal weight, in NAD Eyes: PERRLA, EOMI, no exophthalmos ENT: moist mucous membranes, no thyroid masses, cervical scar healed, no cervical lymphadenopathy Cardiovascular: RRR, No MRG Respiratory: CTA B Gastrointestinal: abdomen soft, NT, ND, BS+ Musculoskeletal: no deformities, strength intact in all 4 Skin: moist, warm, no  rashes Neurological: no tremor with outstretched hands, DTR normal in all 4  ASSESSMENT: 1. Thyroid cancer - see HPI  2. Postsurgical Hypothyroidism  PLAN:  1. Thyroid cancer - papillary - since the tumor was 1.5 cm, radioactive iodine was not definitely indicated for post-op thyroid remnant ablation.  - she had a neck ultrasound in 12/2015: She had a 1 cm probably remnant mass in the thyroid bed. We discussed about this most likely being left in from the previous surgery, and not the recurrence. We will follow this by another ultrasound at next visit, but no intervention is needed for now. - Last thyroglobulin and Tg antibodies reviewed with the pt and  husband >> Tg low, will recheck today - will check a thyroid U/S at next visit in 1 year  2. Patient with h/o total thyroidectomy, previously with Hashimoto's hypothyroidism, on levothyroxine therapy. She appears euthyroid, but is using the rather large dose of levothyroxine, 175 g daily. On this dose, her tests returned normal 8 months ago. - We discussed about correct intake of levothyroxine, fasting, with water, separated by at least 30 minutes from breakfast, and separated by more than 4 hours from calcium, iron, multivitamins, acid reflux medications (PPIs). She is taking it correctly. - will check thyroid tests today: TSH, free T4. We will target the normal range for the TSH - If these are abnormal, she will need to return in 6-8 weeks for repeat labs - If these are normal, we will recheck them at next visit, in 1 year  Component     Latest Ref Rng & Units 01/25/2016  TSH     0.35 - 4.50 uIU/mL 0.37  T4,Free(Direct)     0.60 - 1.60 ng/dL 0.95  [name]     ng/mL 0.3 (L)  [name] Ab     <2 IU/mL <1   [name] continues to decrease, which is great. No anti- thyroglobulin antibodies. TFTs normal, so I will refill her levothyroxine 175 g daily. However, the TSH is closer to the lower limit of normal, so I will have her back in 3 months to recheck her TFTs.  Philemon Kingdom, MD PhD [facility]

## 2016-01-25 NOTE — Patient Instructions (Signed)
Please come back in 1 year.  Please stop at the lab.  Please continue Levothyroxine 175 mcg daily.  Take the thyroid hormone every day, with water, at least 30 minutes before breakfast, separated by at least 4 hours from: - acid reflux medications - calcium - iron - multivitamins

## 2016-01-26 LAB — THYROGLOBULIN ANTIBODY: Thyroglobulin Ab: <1 IU/mL (ref <2)

## 2016-01-26 LAB — THYROGLOBULIN LEVEL: THYROGLOBULIN: 0.3 ng/mL — AB

## 2016-01-26 MED ORDER — LEVOTHYROXINE SODIUM 175 MCG PO TABS: 175.0000 ug | ORAL_TABLET | Freq: Every day | ORAL | 3 refills | Status: DC

## 2016-01-27 ENCOUNTER — Telehealth: Payer: Self-pay

## 2016-01-27 NOTE — Telephone Encounter (Signed)
-----   Message from Philemon Kingdom, MD sent at 01/26/2016  5:40 PM EST ----- Lauren Morton, can you please call pt: [name] continues to decrease, which is great.  TFTs normal, so I refilled her levothyroxine 175 g daily. However, the TSH is closer to the lower limit of normal, so let's have her back in 3 months (before next refill) to recheck her TFTs. Labs are in.

## 2016-01-27 NOTE — Telephone Encounter (Signed)
Called patient, LVM and gave lab results.Gave call back number.

## 2016-02-17 ENCOUNTER — Ambulatory Visit: Payer: BC Managed Care – PPO | Admitting: Internal Medicine

## 2016-02-29 ENCOUNTER — Other Ambulatory Visit: Payer: Self-pay | Admitting: Internal Medicine

## 2016-04-04 ENCOUNTER — Telehealth: Payer: Self-pay

## 2016-04-04 ENCOUNTER — Other Ambulatory Visit (INDEPENDENT_AMBULATORY_CARE_PROVIDER_SITE_OTHER): Payer: BC Managed Care – PPO

## 2016-04-04 DIAGNOSIS — E890 Postprocedural hypothyroidism: Secondary | ICD-10-CM | POA: Diagnosis not present

## 2016-04-04 LAB — T4, FREE: FREE T4: 1.62 ng/dL — AB (ref 0.60–1.60)

## 2016-04-04 LAB — TSH: TSH: 0.43 u[IU]/mL (ref 0.35–4.50)

## 2016-04-04 NOTE — Telephone Encounter (Signed)
-----   Message from Philemon Kingdom, MD sent at 04/04/2016 12:23 PM EDT ----- Almyra Free, can you please call pt: TFTs are OK >> will repeat at next visit.

## 2016-04-04 NOTE — Telephone Encounter (Signed)
Called patient and gave lab results. Patient had no questions or concerns.  

## 2016-04-17 ENCOUNTER — Other Ambulatory Visit: Payer: Self-pay | Admitting: Internal Medicine

## 2016-05-24 IMAGING — CR DG CHEST 2V
2 series · 2 of 2 positions shown · non-contrast
Comparison: 05/22/2009.

CLINICAL DATA: Thyroid surgery.

EXAM:
CHEST  2 VIEW

[w chest pa]
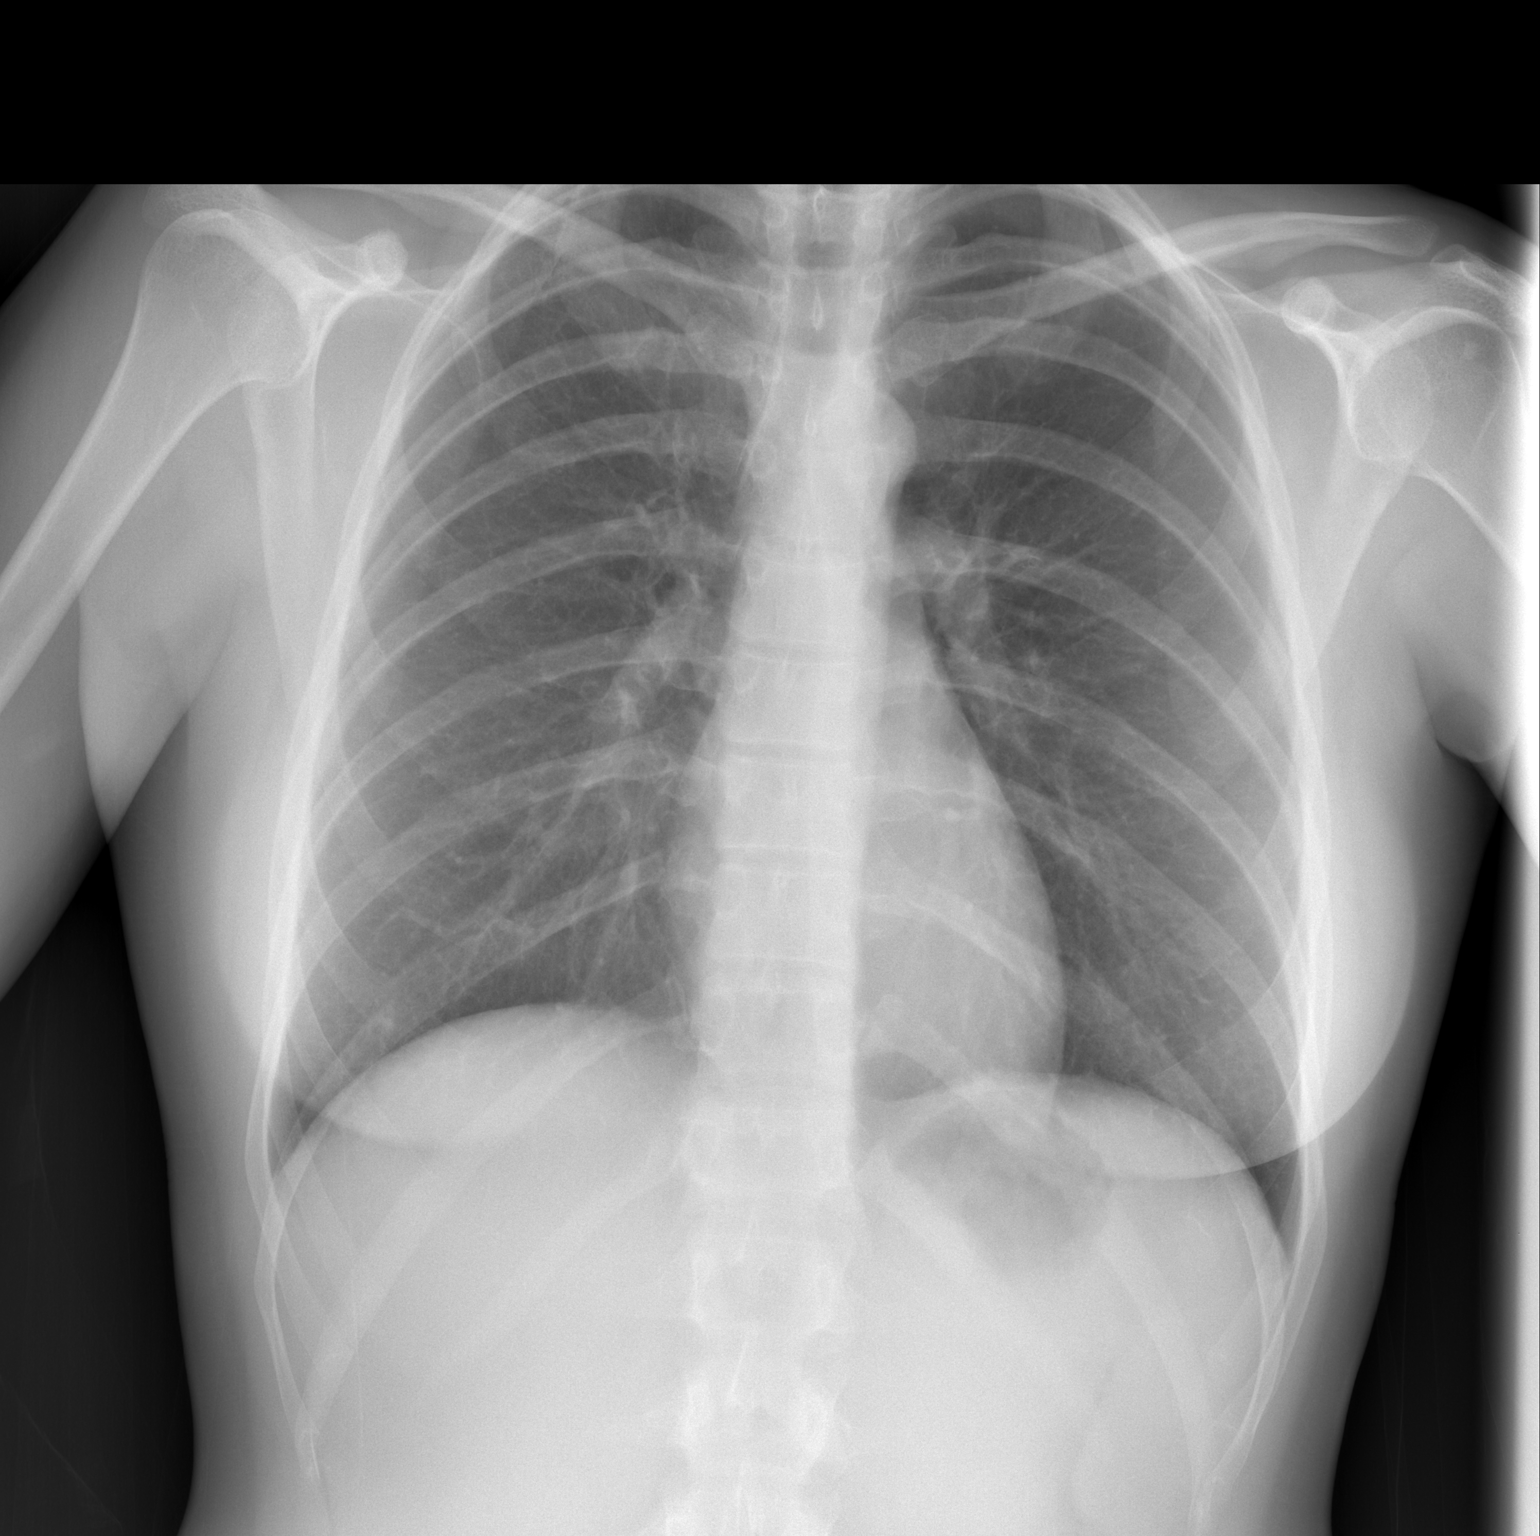

[w chest lat]
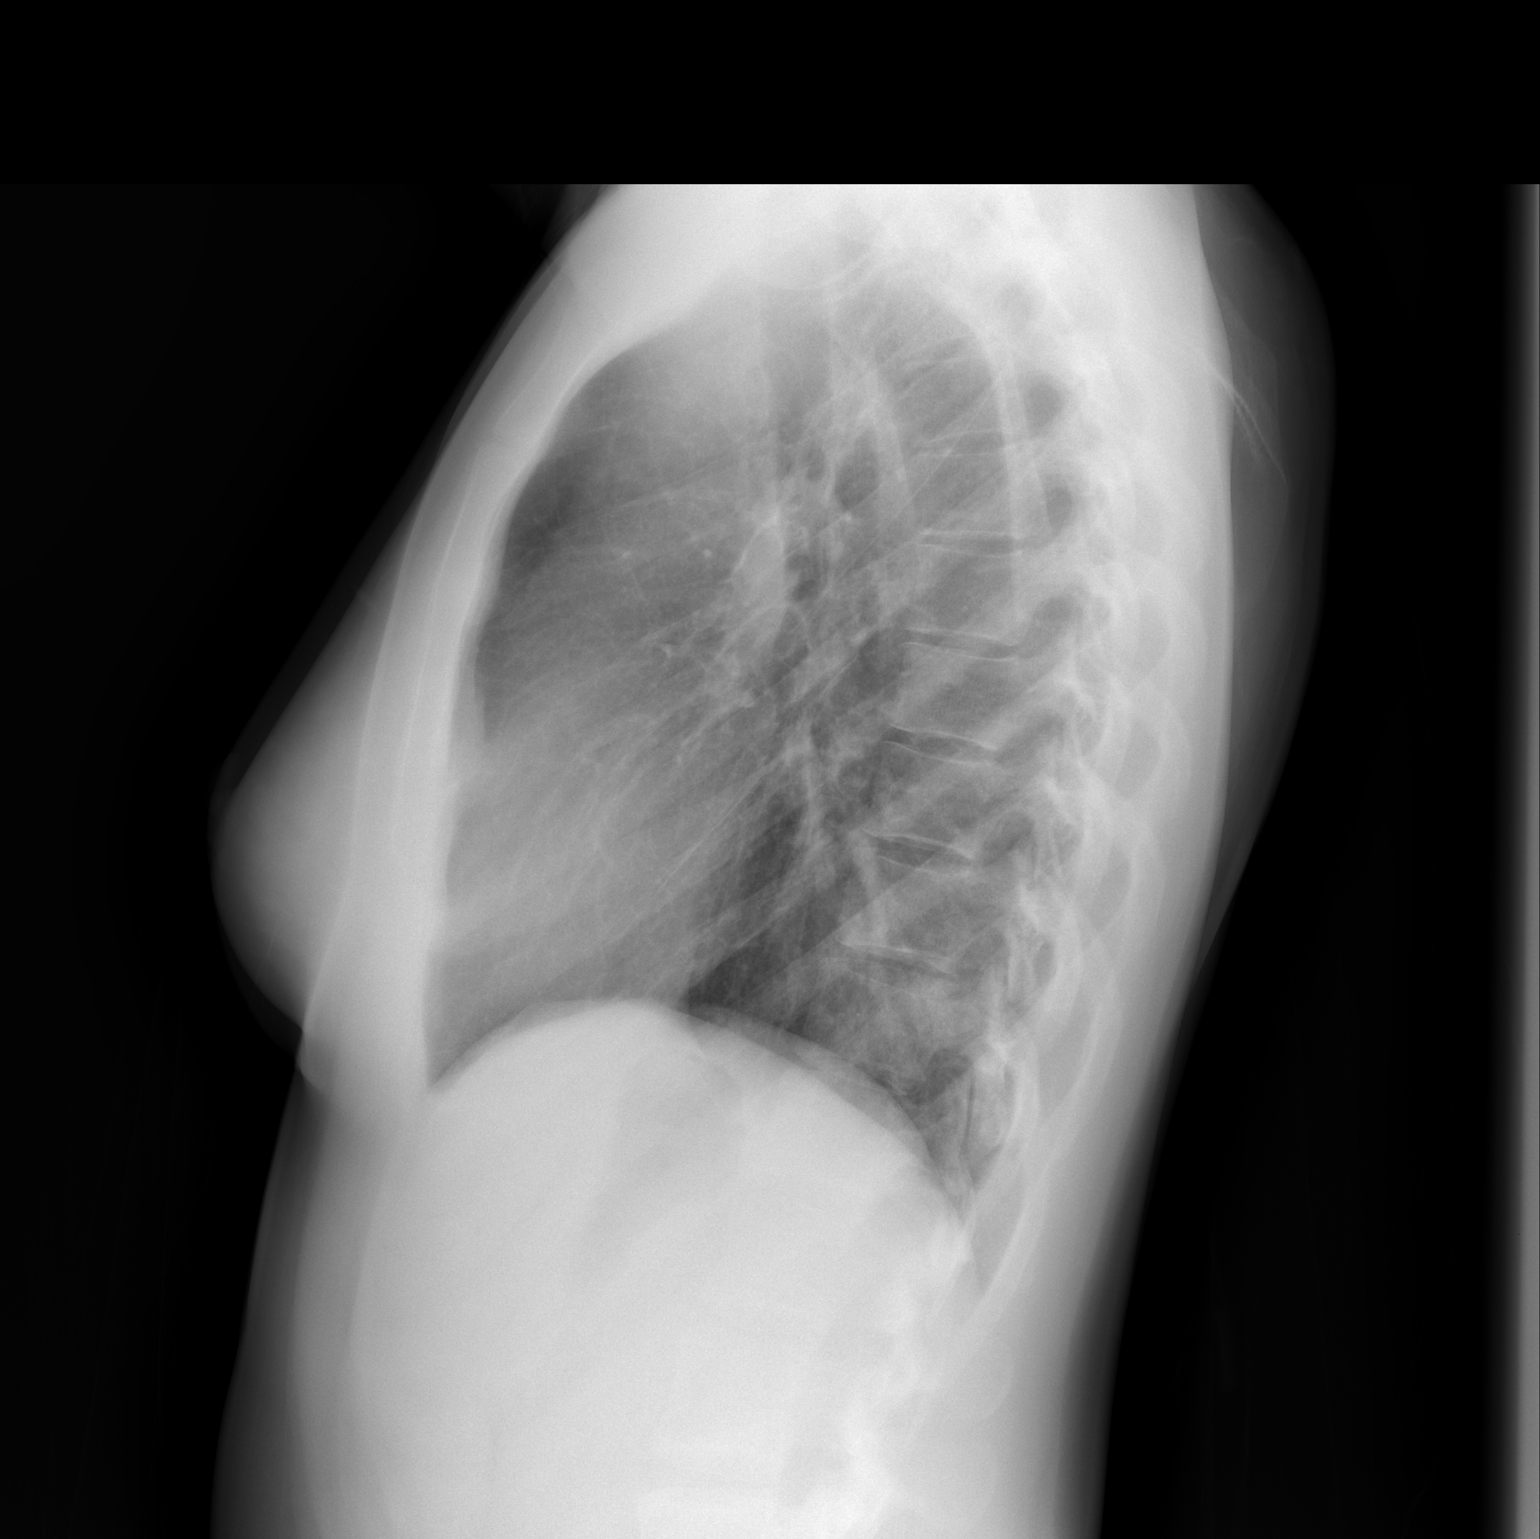

[2 of 2 positions shown; findings below may reference images not displayed]

FINDINGS: The heart size and mediastinal contours are within normal limits.
Both lungs are clear. Punctate sclerotic density in the left humeral
head, most likely bone island.
IMPRESSION: No active cardiopulmonary disease.

## 2016-06-02 ENCOUNTER — Other Ambulatory Visit: Payer: Self-pay | Admitting: Internal Medicine

## 2016-07-20 ENCOUNTER — Other Ambulatory Visit: Payer: Self-pay | Admitting: Internal Medicine

## 2016-09-12 ENCOUNTER — Other Ambulatory Visit: Payer: Self-pay | Admitting: Internal Medicine

## 2016-10-26 ENCOUNTER — Other Ambulatory Visit: Payer: Self-pay | Admitting: Internal Medicine

## 2017-01-11 ENCOUNTER — Other Ambulatory Visit: Payer: Self-pay | Admitting: Internal Medicine

## 2017-01-11 ENCOUNTER — Telehealth: Payer: Self-pay

## 2017-01-11 MED ORDER — LEVOTHYROXINE SODIUM 150 MCG PO TABS: 150.0000 ug | ORAL_TABLET | Freq: Every day | ORAL | 0 refills | Status: DC

## 2017-01-11 NOTE — Telephone Encounter (Signed)
Pt is aware about lab work received and her dose being changed from 141mcg to 150 mcg

## 2017-01-24 ENCOUNTER — Ambulatory Visit: Payer: BC Managed Care – PPO | Admitting: Internal Medicine

## 2017-01-24 ENCOUNTER — Encounter: Payer: Self-pay | Admitting: Internal Medicine

## 2017-01-24 VITALS — BP 118/60 | HR 85 | Temp 98.1°F | Resp 18 | Ht 71.0 in | Wt 165.0 lb

## 2017-01-24 DIAGNOSIS — C73 Malignant neoplasm of thyroid gland: Secondary | ICD-10-CM

## 2017-01-24 DIAGNOSIS — E890 Postprocedural hypothyroidism: Secondary | ICD-10-CM

## 2017-01-24 NOTE — Patient Instructions (Addendum)
Please come back in 1 year.  Please come back for labs in 5 weeks.  Please continue Levothyroxine 150 mcg daily.  Take the thyroid hormone every day, with water, at least 30 minutes before breakfast, separated by at least 4 hours from: - acid reflux medications - calcium - iron - multivitamins

## 2017-01-24 NOTE — Progress Notes (Addendum)
Patient ID: Lauren Morton, female   DOB: 11/22/1985, 32 y.o.   MRN: [id number]   HPI  Lauren Morton is a 32 y.o.-year-old female, returning for f/u for papillary thyroid cancer and postsurgical hypothyroidism. Last visit 1 year ago.  Reviewed and addended thyroid cancer history: Pt. has been dx with goiter in ~2006>> thyroid U/S: ? Results but also dx'ed with Hashimoto's thyroiditis with hypothyroidism >> started LT4.  She started to see Dr Danise Mina 03/2014 >> new U/S (04/2014): small R thyroid nodule 1.4 x 1.3 x 1.1 cm, with microcalcifications >>  PTC in 04/2014 by FNA.   - 06/03/2014: Total thyroidectomy -Dr. Harlow Asa. Path: Diagnosis 1. Thyroid, thyroidectomy, total thyroid, suture right superior pole - PAPILLARY THYROID CARCINOMA, 1.5 CM IN GREATEST DIMENSION. - MARGINS ARE NEGATIVE. - BACKGROUND FLORID LYMPHOCYTIC THYROIDITIS. - SEE ONCOLOGY TEMPLATE. 2. Lymph nodes, regional resection, central compartment - TEN BENIGN LYMPH NODES WITH NO TUMOR SEEN (0/10). Microscopic Comment 1. THYROID Specimen: Thyroid with central compartment lymph nodes. Procedure: Total thyroidectomy with limited central compartment lymph dissection. Specimen Integrity (intact/fragmented): Intact. Tumor focality: Unifocal. Dominant tumor: Maximum tumor size (cm): 1.5 cm. Tumor laterality: Tumor involves right superior pole. Histologic type (including subtype and/or unique features as applicable): Papillary thyroid carcinoma, conventional type. Tumor capsule: Tumor is partially encapsulated. Extrathyroidal extension: No. Margins: Negative. Lymph - Vascular invasion: Definitive lymph/vascular is not identified. Capsular invasion with degree of invasion if present: No capsular invasion identified. Lymph nodes: # examined 10; # positive 0. TNM code: pT1b, pN0. Non-neoplastic thyroid: Florid lymphocytic thyroiditis. (RH:ds 06/04/14)  [name] levels have been low, espite not undergoing  RAI tx (as her tumor was only 1.5 cm): Lab Results  Component Value Date   THYROGLB 0.3 (L) 01/25/2016   THYROGLB 0.8 (L) 02/18/2015   THYROGLB 0.8 (L) 08/18/2014   Lab Results  Component Value Date   THGAB <1 01/25/2016   THGAB <1 02/18/2015   THGAB 1 08/18/2014   12/2015: Neck U/S 1 year after thyroidectomy:  Post total thyroidectomy with minimal amount (approximately 1 cm) of residual nodular tissue within the right thyroid lobectomy resection bed potentially indicative of residual thyroid tissue though residual/locally recurrent disease is not excluded on the basis this examination. This residual tissue could be amenable to ultrasound-guided fine-needle aspiration as clinically indicated.  Postsurgical hypothyroidism: Pt is on levothyroxine 175 >> 150 mcg daily (changed 4 days ago), taken: - in am - fasting - at least 30 min from b'fast - no Ca, Fe, MVI, PPIs - not on Biotin Drinks coffee + cream 45 min later.  I reviewed pt's thyroid tests: 01/10/2017: TSH 0.03 at OB/GYN office -we decreased the dose of levothyroxine at that time. Lab Results  Component Value Date   TSH 0.43 04/04/2016   TSH 0.37 01/25/2016   TSH 3.26 05/05/2015   TSH 7.52 (H) 02/18/2015   TSH 1.67 11/15/2014   TSH 19.38 (H) 09/24/2014   TSH 13.02 (H) 08/18/2014   TSH 3.83 03/31/2014   TSH 1.25 03/25/2012   TSH 0.84 10/31/2010   FREET4 1.62 (H) 04/04/2016   FREET4 0.95 01/25/2016   FREET4 1.13 05/05/2015   FREET4 0.91 02/18/2015   FREET4 1.11 11/15/2014   FREET4 0.88 09/24/2014   FREET4 0.91 08/18/2014   FREET4 0.77 03/31/2014   FREET4 0.6 04/29/2006    Pt denies: - feeling nodules in neck - hoarseness - dysphagia - choking - SOB with lying down  She has + FH of thyroid disorders in: MGM -  Hashimoto's ds. No FH of thyroid cancer. No h/o radiation tx to head or neck.  No seaweed or kelp. No recent contrast studies. No herbal supplements. No Biotin use. No recent steroids use.    ROS: Constitutional: no weight gain/no weight loss, no fatigue, no subjective hyperthermia, no subjective hypothermia Eyes: no blurry vision, no xerophthalmia ENT: no sore throat, + see HPI Cardiovascular: no CP/no SOB/no palpitations/no leg swelling Respiratory: no cough/no SOB/no wheezing Gastrointestinal: no N/no V/no D/no C/+ acid reflux and eructations Musculoskeletal: no muscle aches/no joint aches Skin: no rashes, no hair loss Neurological: no tremors/no numbness/no tingling/no dizziness  I reviewed pt's medications, allergies, PMH, social hx, family hx, and changes were documented in the history of present illness. Otherwise, unchanged from my initial visit note.  Past Medical History:  Diagnosis Date  . CKD (chronic kidney disease) stage 1, GFR 90 ml/min or greater 09/2013   mild proteinuria [facility]) sees yearly  . Hashimoto's thyroiditis    confirmed by biopsy s/p thyroidectomy  . Hypothyroidism, acquired, autoimmune   . Papillary thyroid carcinoma ([place]) 03/31/2014   s/p thyroidectomy, 10 negative LN   Past Surgical History:  Procedure Laterality Date  . LYMPH NODE DISSECTION N/A 06/03/2014   Procedure: LIMITED LYMPH NODE DISSECTION;  Surgeon: Armandina Gemma, MD;  Location: [facility];  Service: General;  Laterality: N/A;  . THYROIDECTOMY N/A 06/03/2014   total - 1.5cm papillary thyroid carcinoma with negative surgical margins and 10/10 LN neg for mets; Armandina Gemma, MD  . TOTAL THYROIDECTOMY  06/2014   with limited central lymph node dissection ([place])  . WISDOM TOOTH EXTRACTION     4   Social History   Social History  . Marital Status: Married    Spouse Name: N/A  . Number of Children: 0   Occupational History  . School counselor   Social History Main Topics  . Smoking status: Never Smoker   . Smokeless tobacco: Never Used  . Alcohol Use: Yes     Comment: social use  . Drug Use: No   Social History Narrative   Caffeine: 1 soda at lunch   Lives with husband, 2  dogs and 1 pig, chickens   Occupation: Animal nutritionist   Edu: Masters in education   Activity: runs, has done 1/2 marathons   Diet: good water, good fruits/vegetables, red meat 2x/wk, fish 1x/wk   Current Outpatient Medications on File Prior to Visit  Medication Sig Dispense Refill  . levothyroxine (SYNTHROID, LEVOTHROID) 150 MCG tablet Take 1 tablet (150 mcg total) by mouth daily. 90 tablet 0  . TRI-PREVIFEM 0.18/0.215/0.25 MG-35 MCG tablet Take 1 tablet by mouth daily.  11   No current facility-administered medications on file prior to visit.    No Known Allergies Family History  Problem Relation Age of Onset  . Cancer Mother 24       breast cancer, s/p mastectomy  . Diabetes Father   . Coronary artery disease Maternal Grandfather        several MIs  . Stroke Neg Hx   . Cancer Maternal Grandmother        breast cancer s/p lumpectomy  . Thyroid disease Maternal Grandmother   . Alcohol abuse Paternal Grandmother    PE: BP 118/60 (BP Location: Left Arm, Patient Position: Sitting, Cuff Size: Normal)   Pulse 85   Temp 98.1 F (36.7 C) (Oral)   Resp 18   Ht '5\' 11"'$  (1.803 m)   Wt 165 lb (74.8 kg)  SpO2 98%   BMI 23.01 kg/m  Body mass index is 23.01 kg/m. Wt Readings from Last 3 Encounters:  01/24/17 165 lb (74.8 kg)  01/25/16 161 lb (73 kg)  12/02/15 157 lb 4 oz (71.3 kg)   Constitutional: overweight, in NAD Eyes: PERRLA, EOMI, no exophthalmos ENT: moist mucous membranes, no neck masses, no cervical lymphadenopathy Cardiovascular: RRR, No MRG Respiratory: CTA B Gastrointestinal: abdomen soft, NT, ND, BS+ Musculoskeletal: no deformities, strength intact in all 4 Skin: moist, warm, no rashes Neurological: no tremor with outstretched hands, DTR normal in all 4   ASSESSMENT: 1. Thyroid cancer - see HPI  2. Postsurgical Hypothyroidism  PLAN:  1. Thyroid cancer - papillary -The tumor was 1.5 cm, RAI treatment was not definitely indicated for postop thyroid  remnant ablation.  We continue to follow her with thyroglobulin levels and thyroid ultrasounds.  Latest neck ultrasound obtained 1 year after her surgery (12/2015)was reviewed together today: She has a 1 cm probably remnant mass in the thyroid bed.  This is most likely left in place after her surgery, and not a recurrence. -Last thyroglobulin and ATA antibodies were reviewed: [name] lower and ATA undetectable -We will repeat these in 5 weeks -We will check neck ultrasound at next visit  2. Patient with h/o total thyroidectomy, previously with Hashimoto's hypothyroidism, on levothyroxine therapy.  She is telling me that she and her husband are not using protection, but are not actually trying to get pregnant.  I advised her to only start trying when her TFTs are normal.  I also advised her to let me know as soon as she gets pregnant so we can change her levothyroxine dose. - latest thyroid labs reviewed with pt >> low at last check by OB/GYN earlier this month.  At that time, we decrease her dose of thyroxine - she continues on LT4 150 mcg daily - pt feels good on this dose >>   We will continue - we discussed about taking the thyroid hormone every day, with water, >30 minutes before breakfast, separated by >4 hours from acid reflux medications, calcium, iron, multivitamins. Pt. is taking it correctly - will check thyroid tests in 5 weeks: TSH and fT4 - RTC in 1 year  Orders Placed This Encounter  Procedures  . TSH  . T4, free  . [name] Level  . [name] antibody   Component     Latest Ref Rng & Units 03/01/2017  [name]     ng/mL 0.4 (L)  Comment        TSH     0.35 - 4.50 uIU/mL 0.05 (L)  T4,Free(Direct)     0.60 - 1.60 ng/dL 0.93  [name] Ab     < or = 1 IU/mL <1   [name] level higher, but not a clear upward trend. TSH is very suppressed.  Will need to decrease the dose further, I would suggest that she takes 125 mcg daily.  We need another set  of labs in 1.5 months.  Philemon Kingdom, MD PhD [facility] Endocrinology

## 2017-03-01 ENCOUNTER — Other Ambulatory Visit (INDEPENDENT_AMBULATORY_CARE_PROVIDER_SITE_OTHER): Payer: BC Managed Care – PPO

## 2017-03-01 DIAGNOSIS — E890 Postprocedural hypothyroidism: Secondary | ICD-10-CM

## 2017-03-01 DIAGNOSIS — C73 Malignant neoplasm of thyroid gland: Secondary | ICD-10-CM

## 2017-03-01 LAB — TSH: TSH: 0.05 u[IU]/mL — ABNORMAL LOW (ref 0.35–4.50)

## 2017-03-01 LAB — T4, FREE: Free T4: 0.93 ng/dL (ref 0.60–1.60)

## 2017-03-04 LAB — THYROGLOBULIN ANTIBODY: Thyroglobulin Ab: <1 IU/mL (ref <=1)

## 2017-03-04 LAB — THYROGLOBULIN LEVEL: THYROGLOBULIN: 0.4 ng/mL — AB

## 2017-03-04 MED ORDER — LEVOTHYROXINE SODIUM 125 MCG PO TABS: 125.0000 ug | ORAL_TABLET | Freq: Every day | ORAL | 5 refills | Status: DC

## 2017-03-04 NOTE — Addendum Note (Signed)
Addended by: Philemon Kingdom on: 03/04/2017 05:13 PM   Modules accepted: Orders

## 2017-03-05 ENCOUNTER — Telehealth: Payer: Self-pay

## 2017-03-05 NOTE — Telephone Encounter (Signed)
Spoke to patient. Gave results and instructions. Pt verbalized understanding. Will c/b to schedule lab visit.

## 2017-03-05 NOTE — Telephone Encounter (Signed)
-----   Message from Philemon Kingdom, MD sent at 03/04/2017  5:13 PM EST ----- Loma Sousa, can you please call pt: Lauren Morton there is slightly higher, but not a clear upward trend. TSH is still very low.  Will need to decrease the levothyroxine dose further, I would suggest that she takes 125 mcg daily.  I sent this to her pharmacy.  We need another set of labs in 1.5 months.  Labs are in.

## 2017-03-08 ENCOUNTER — Other Ambulatory Visit: Payer: BC Managed Care – PPO

## 2017-03-16 ENCOUNTER — Other Ambulatory Visit: Payer: Self-pay | Admitting: Internal Medicine

## 2017-12-12 ENCOUNTER — Other Ambulatory Visit: Payer: Self-pay | Admitting: Internal Medicine

## 2017-12-12 ENCOUNTER — Ambulatory Visit: Payer: BC Managed Care – PPO | Admitting: Internal Medicine

## 2017-12-12 ENCOUNTER — Encounter: Payer: Self-pay | Admitting: Internal Medicine

## 2017-12-12 VITALS — BP 108/60 | HR 90 | Ht 71.0 in | Wt 161.0 lb

## 2017-12-12 DIAGNOSIS — E890 Postprocedural hypothyroidism: Secondary | ICD-10-CM | POA: Diagnosis not present

## 2017-12-12 DIAGNOSIS — C73 Malignant neoplasm of thyroid gland: Secondary | ICD-10-CM | POA: Diagnosis not present

## 2017-12-12 LAB — TSH: TSH: 0.02 u[IU]/mL — ABNORMAL LOW (ref 0.35–4.50)

## 2017-12-12 LAB — T4, FREE: FREE T4: 1.54 ng/dL (ref 0.60–1.60)

## 2017-12-12 MED ORDER — LEVOTHYROXINE SODIUM 125 MCG PO TABS: 125.0000 ug | ORAL_TABLET | Freq: Every day | ORAL | 5 refills | Status: DC

## 2017-12-12 NOTE — Progress Notes (Signed)
Patient ID: Lauren Morton, female   DOB: [birth year], 32 y.o.   MRN: [id number]   HPI  Lauren Morton is a 32 y.o.-year-old female, returning for f/u for papillary thyroid cancer and postsurgical hypothyroidism. Last visit 11 months ago.  Reviewed and addended thyroid cancer history: Pt. has been dx with goiter in ~2006>> thyroid U/S: ? Results but also dx'ed with Hashimoto's thyroiditis with hypothyroidism >> started levothyroxine.  She started to see Dr Danise Mina 03/2014 >> new U/S (04/2014): small R thyroid nodule 1.4 x 1.3 x 1.1 cm, with microcalcifications >>  PTC in 04/2014, by FNA.   - 06/03/2014: Total thyroidectomy -Dr. Harlow Asa. Path: Diagnosis 1. Thyroid, thyroidectomy, total thyroid, suture right superior pole - PAPILLARY THYROID CARCINOMA, 1.5 CM IN GREATEST DIMENSION. - MARGINS ARE NEGATIVE. - BACKGROUND FLORID LYMPHOCYTIC THYROIDITIS. - SEE ONCOLOGY TEMPLATE. 2. Lymph nodes, regional resection, central compartment - TEN BENIGN LYMPH NODES WITH NO TUMOR SEEN (0/10). Microscopic Comment 1. THYROID Specimen: Thyroid with central compartment lymph nodes. Procedure: Total thyroidectomy with limited central compartment lymph dissection. Specimen Integrity (intact/fragmented): Intact. Tumor focality: Unifocal. Dominant tumor: Maximum tumor size (cm): 1.5 cm. Tumor laterality: Tumor involves right superior pole. Histologic type (including subtype and/or unique features as applicable): Papillary thyroid carcinoma, conventional type. Tumor capsule: Tumor is partially encapsulated. Extrathyroidal extension: No. Margins: Negative. Lymph - Vascular invasion: Definitive lymph/vascular is not identified. Capsular invasion with degree of invasion if present: No capsular invasion identified. Lymph nodes: # examined 10; # positive 0. TNM code: pT1b, pN0. Non-neoplastic thyroid: Florid lymphocytic thyroiditis. (RH:ds 06/04/14)  [name] levels remain detectable as she  did not have RAI treatment (since her tumor was only 1.5 cm): Lab Results  Component Value Date   THYROGLB 0.4 (L) 03/01/2017   THYROGLB 0.3 (L) 01/25/2016   THYROGLB 0.8 (L) 02/18/2015   THYROGLB 0.8 (L) 08/18/2014   Lab Results  Component Value Date   THGAB <1 03/01/2017   THGAB <1 01/25/2016   THGAB <1 02/18/2015   THGAB 1 08/18/2014   12/2015: Neck U/S 1 year after thyroidectomy:  Post total thyroidectomy with minimal amount (approximately 1 cm) of residual nodular tissue within the right thyroid lobectomy resection bed potentially indicative of residual thyroid tissue though residual/locally recurrent disease is not excluded on the basis this examination. This residual tissue could be amenable to ultrasound-guided fine-needle aspiration as clinically indicated.  Postsurgical hypothyroidism: Pt is on levothyroxine 175 >> 150 >> 125 mcg daily, however, she ran out out of this and the 175 was refilled (!!!) 1 mo ago: - in am - fasting - Drinks coffee with cream ~45 minutes later - at least 30 min from b'fast - no Ca, Fe, PPIs - + Prenatals at night - in last week - not on Biotin  I reviewed patient's TFTs: Lab Results  Component Value Date   TSH 0.05 (L) 03/01/2017   TSH 0.43 04/04/2016   TSH 0.37 01/25/2016   TSH 3.26 05/05/2015   TSH 7.52 (H) 02/18/2015   TSH 1.67 11/15/2014   TSH 19.38 (H) 09/24/2014   TSH 13.02 (H) 08/18/2014   TSH 3.83 03/31/2014   TSH 1.25 03/25/2012   FREET4 0.93 03/01/2017   FREET4 1.62 (H) 04/04/2016   FREET4 0.95 01/25/2016   FREET4 1.13 05/05/2015   FREET4 0.91 02/18/2015   FREET4 1.11 11/15/2014   FREET4 0.88 09/24/2014   FREET4 0.91 08/18/2014   FREET4 0.77 03/31/2014   FREET4 0.6 04/29/2006  01/10/2017: TSH 0.03 at OB/GYN office -we  decreased the dose of levothyroxine at that time.  Pt denies: - feeling nodules in neck - hoarseness - dysphagia - choking - SOB with lying down  She has + FH of thyroid disorders in: MGM -  Hashimoto's ds. No FH of thyroid cancer. No h/o radiation tx to head or neck.  No herbal supplements. No Biotin use. No recent steroids use.   ROS: Constitutional: no weight gain/no weight loss, no fatigue, no subjective hyperthermia, no subjective hypothermia Eyes: no blurry vision, no xerophthalmia ENT: no sore throat, + see HPI Cardiovascular: no CP/no SOB/+ palpitations/no leg swelling Respiratory: no cough/no SOB/no wheezing Gastrointestinal: no N/no V/no D/no C/no acid reflux Musculoskeletal: no muscle aches/no joint aches Skin: no rashes, no hair loss Neurological: no tremors/no numbness/no tingling/no dizziness  I reviewed pt's medications, allergies, PMH, social hx, family hx, and changes were documented in the history of present illness. Otherwise, unchanged from my initial visit note.  Past Medical History:  Diagnosis Date  . CKD (chronic kidney disease) stage 1, GFR 90 ml/min or greater 09/2013   mild proteinuria [facility]) sees yearly  . Hashimoto's thyroiditis    confirmed by biopsy s/p thyroidectomy  . Hypothyroidism, acquired, autoimmune   . Papillary thyroid carcinoma ([place]) 03/31/2014   s/p thyroidectomy, 10 negative LN   Past Surgical History:  Procedure Laterality Date  . LYMPH NODE DISSECTION N/A 06/03/2014   Procedure: LIMITED LYMPH NODE DISSECTION;  Surgeon: Armandina Gemma, MD;  Location: [facility];  Service: General;  Laterality: N/A;  . THYROIDECTOMY N/A 06/03/2014   total - 1.5cm papillary thyroid carcinoma with negative surgical margins and 10/10 LN neg for mets; Armandina Gemma, MD  . TOTAL THYROIDECTOMY  06/2014   with limited central lymph node dissection ([place])  . WISDOM TOOTH EXTRACTION     4   Social History   Social History  . Marital Status: Married    Spouse Name: N/A  . Number of Children: 0   Occupational History  . School counselor   Social History Main Topics  . Smoking status: Never Smoker   . Smokeless tobacco: Never Used  . Alcohol Use:  Yes     Comment: social use  . Drug Use: No   Social History Narrative   Caffeine: 1 soda at lunch   Lives with husband, 2 dogs and 1 pig, chickens   Occupation: Animal nutritionist   Edu: Masters in education   Activity: runs, has done 1/2 marathons   Diet: good water, good fruits/vegetables, red meat 2x/wk, fish 1x/wk   Current Outpatient Medications on File Prior to Visit  Medication Sig Dispense Refill  . levothyroxine (SYNTHROID, LEVOTHROID) 125 MCG tablet Take 1 tablet (125 mcg total) by mouth daily. 45 tablet 5  . levothyroxine (SYNTHROID, LEVOTHROID) 175 MCG tablet TAKE 1 TABLET (175 MCG TOTAL) BY MOUTH DAILY BEFORE BREAKFAST. 90 tablet 0  . TRI-PREVIFEM 0.18/0.215/0.25 MG-35 MCG tablet Take 1 tablet by mouth daily.  11   No current facility-administered medications on file prior to visit.    No Known Allergies Family History  Problem Relation Age of Onset  . Cancer Mother 33       breast cancer, s/p mastectomy  . Diabetes Father   . Coronary artery disease Maternal Grandfather        several MIs  . Stroke Neg Hx   . Cancer Maternal Grandmother        breast cancer s/p lumpectomy  . Thyroid disease Maternal Grandmother   . Alcohol abuse  Paternal Grandmother    PE: BP 108/60   Pulse 90   Ht '5\' 11"'$  (1.803 m) Comment: measured  Wt 161 lb (73 kg)   LMP 11/28/2017   SpO2 98%   BMI 22.45 kg/m  Body mass index is 22.45 kg/m. Wt Readings from Last 3 Encounters:  12/12/17 161 lb (73 kg)  01/24/17 165 lb (74.8 kg)  01/25/16 161 lb (73 kg)   Constitutional: Normal weight, in NAD Eyes: PERRLA, EOMI, no exophthalmos ENT: moist mucous membranes, no neck masses palpated, no cervical lymphadenopathy Cardiovascular: Tachycardia RR, No MRG Respiratory: CTA B Gastrointestinal: abdomen soft, NT, ND, BS+ Musculoskeletal: no deformities, strength intact in all 4 Skin: moist, warm, no rashes Neurological: no tremor with outstretched hands, DTR normal in all 4  ASSESSMENT: 1.  Thyroid cancer - see HPI  2. Postsurgical Hypothyroidism  PLAN:  1. Thyroid cancer - papillary -Patient with a 1.5 cm papillary thyroid cancer focus, for which she had total thyroidectomy, but no RAI treatment due to size and low risk status.  We continue to follow her with thyroglobulin levels and neck ultrasounds.  Her latest neck ultrasound was obtained in 12/2015.  She has a 1 cm remnant mass in the thyroid fossa.  This is very unlikely to be a recurrence. -We reviewed together her latest thyroglobulin and ATA antibodies: thyroglobulin level was low and ATA antibodies were undetectable -We will repeat these today -I would also suggest to check a neck ultrasound now, to follow-up on her previous probable regimen.  However, I did not feel any masses in her neck on palpation today.  2. Patient with h/o total thyroidectomy, previously with Hashimoto's hypothyroidism, on levothyroxine therapy.  - latest thyroid labs reviewed with pt >> TSH was suppressed so we decreased the dose of her levothyroxine.  She is currently on 125 mcg daily.  Unfortunately, she did not return for repeat labs in 1.5 months after the dose change, as advised.  At this visit, she tells me that approximately 1 month ago she needed a refill of her levothyroxine in the 175 mcg dose was refilled.  She started to take this... I advised her to always check with [place] before changing dose, especially since she is not aware of the decision to change the dose.  I strongly advised her to join my chart so the communication is easier.  We also discussed about possible side effects of over replacement with thyroid hormones to include tachycardia, arrhythmia, hypercoagulability with increased risk of stroke and MI. - pt has more palpitations on this dose - we discussed about taking the thyroid hormone every day, with water, >30 minutes before breakfast, separated by >4 hours from acid reflux medications, calcium, iron, multivitamins. Pt. is  taking it correctly.  She started to take prenatal multivitamins at night. - will check thyroid tests today: TSH and fT4, and I anticipate that we will need to decrease the dose afterwards. - If labs are abnormal, she will need to return for repeat TFTs in 1.5 months - We discussed about the fact that if she plans to get pregnant, her TFTs need to be perfect before pregnancy.  We discussed about targets for treatment during pregnancy: TSH less than 2.5 in the first trimester and less than 3 in the following trimesters. - RTC in 6 months  Need refills.  Component     Latest Ref Rng & Units 12/12/2017  [name]     ng/mL 0.2 (L)  TSH     0.35 -  4.50 uIU/mL 0.02 (L)  T4,Free(Direct)     0.60 - 1.60 ng/dL 1.54  [name] Ab     < or = 1 IU/mL <1   Tg slightly lower, ATA undetectable. TSH very suppressed >> decrease LT4 dose to 125 mcg daily and RTC for labs in 5-6 weeks.  Philemon Kingdom, MD PhD [facility] Endocrinology

## 2017-12-12 NOTE — Patient Instructions (Addendum)
Please continue Levothyroxine 175 mg daily.  Take the thyroid hormone every day, with water, at least 30 minutes before breakfast, separated by at least 4 hours from: - acid reflux medications - calcium - iron - multivitamins  Please stop at the lab.  Target TSH for pregnancy: - first trimester: <2.5 - afterwards: <3  Please return in 6 months.

## 2017-12-13 LAB — THYROGLOBULIN ANTIBODY: Thyroglobulin Ab: <1 IU/mL (ref <=1)

## 2017-12-13 LAB — THYROGLOBULIN LEVEL: THYROGLOBULIN: 0.2 ng/mL — AB

## 2017-12-31 ENCOUNTER — Other Ambulatory Visit: Payer: Self-pay | Admitting: Internal Medicine

## 2018-01-03 ENCOUNTER — Ambulatory Visit
Admission: RE | Admit: 2018-01-03 | Discharge: 2018-01-03 | Disposition: A | Discharge status: Home / Self Care | Payer: BC Managed Care – PPO | Source: Ambulatory Visit | Attending: Internal Medicine | Admitting: Internal Medicine

## 2018-01-03 DIAGNOSIS — C73 Malignant neoplasm of thyroid gland: Secondary | ICD-10-CM

## 2018-01-15 ENCOUNTER — Other Ambulatory Visit (INDEPENDENT_AMBULATORY_CARE_PROVIDER_SITE_OTHER): Payer: BC Managed Care – PPO

## 2018-01-15 DIAGNOSIS — E890 Postprocedural hypothyroidism: Secondary | ICD-10-CM | POA: Diagnosis not present

## 2018-01-16 ENCOUNTER — Other Ambulatory Visit: Payer: Self-pay | Admitting: Internal Medicine

## 2018-01-16 LAB — T4, FREE: Free T4: 1.26 ng/dL (ref 0.60–1.60)

## 2018-01-16 LAB — TSH: TSH: 0.4 u[IU]/mL (ref 0.35–4.50)

## 2018-01-24 ENCOUNTER — Ambulatory Visit: Payer: BC Managed Care – PPO | Admitting: Internal Medicine

## 2018-06-03 ENCOUNTER — Other Ambulatory Visit: Payer: Self-pay

## 2018-06-03 ENCOUNTER — Telehealth: Payer: Self-pay | Admitting: Family Medicine

## 2018-06-03 NOTE — Telephone Encounter (Signed)
Spoke with pt asking if pt wanted copy of immunizations mailed to her.  Says she will pick them up.   Printed immunizations.  Placed at front desk in yellow folders.

## 2018-06-03 NOTE — Telephone Encounter (Signed)
Patient is calling to get her immunization records.  She saw some immunizations in my chart, but wanted to know if we had all of her immunization records or if there's a way to get them all.  Please call patient back.

## 2018-06-05 ENCOUNTER — Ambulatory Visit: Payer: BC Managed Care – PPO | Admitting: Internal Medicine

## 2018-06-05 ENCOUNTER — Other Ambulatory Visit: Payer: Self-pay

## 2018-06-05 ENCOUNTER — Encounter: Payer: Self-pay | Admitting: Internal Medicine

## 2018-06-05 VITALS — BP 100/60 | HR 84 | Ht 71.0 in | Wt 164.0 lb

## 2018-06-05 DIAGNOSIS — E890 Postprocedural hypothyroidism: Secondary | ICD-10-CM | POA: Diagnosis not present

## 2018-06-05 DIAGNOSIS — C73 Malignant neoplasm of thyroid gland: Secondary | ICD-10-CM

## 2018-06-05 LAB — T4, FREE: Free T4: 1.36 ng/dL (ref 0.60–1.60)

## 2018-06-05 LAB — TSH: TSH: 0.11 u[IU]/mL — ABNORMAL LOW (ref 0.35–4.50)

## 2018-06-05 NOTE — Patient Instructions (Signed)
Please continue Levothyroxine 125 mg daily.  Take the thyroid hormone every day, with water, at least 30 minutes before breakfast, separated by at least 4 hours from: - acid reflux medications - calcium - iron - multivitamins  Please stop at the lab.  Target TSH for pregnancy: - first trimester: <2.5 - afterwards: <3  Please return in 1 year.

## 2018-06-05 NOTE — Progress Notes (Signed)
Patient ID: Lauren Morton, female   DOB: 1985/01/19, 33 y.o.   MRN: [id number]   HPI  Lauren Morton is a 33 y.o.-year-old female, returning for f/u for papillary thyroid cancer and postsurgical hypothyroidism. Last visit 6 months ago.  Reviewed and addended her thyroid cancer history: Pt. has been dx with goiter in ~2006>> thyroid U/S: ? Results but also dx'ed with Hashimoto's thyroiditis with hypothyroidism >> started levothyroxine.  She started to see Lauren Morton >> new U/S (04/2014): small R thyroid nodule 1.4 x 1.3 x 1.1 cm, with microcalcifications >>  PTC in 04/2014, by FNA.   - 06/03/2014: Total thyroidectomy -Lauren Morton. Path: Diagnosis 1. Thyroid, thyroidectomy, total thyroid, suture right superior pole - PAPILLARY THYROID CARCINOMA, 1.5 CM IN GREATEST DIMENSION. - MARGINS ARE NEGATIVE. - BACKGROUND FLORID LYMPHOCYTIC THYROIDITIS. - SEE ONCOLOGY TEMPLATE. 2. Lymph nodes, regional resection, central compartment - TEN BENIGN LYMPH NODES WITH NO TUMOR SEEN (0/10). Microscopic Comment 1. THYROID Specimen: Thyroid with central compartment lymph nodes. Procedure: Total thyroidectomy with limited central compartment lymph dissection. Specimen Integrity (intact/fragmented): Intact. Tumor focality: Unifocal. Dominant tumor: Maximum tumor size (cm): 1.5 cm. Tumor laterality: Tumor involves right superior pole. Histologic type (including subtype and/or unique features as applicable): Papillary thyroid carcinoma, conventional type. Tumor capsule: Tumor is partially encapsulated. Extrathyroidal extension: No. Margins: Negative. Lymph - Vascular invasion: Definitive lymph/vascular is not identified. Capsular invasion with degree of invasion if present: No capsular invasion identified. Lymph nodes: # examined 10; # positive 0. TNM code: pT1b, pN0. Non-neoplastic thyroid: Florid lymphocytic thyroiditis. (RH:ds 06/04/14)  Neck U/S (12/28/2018): Isthmus: Surgically  absent. There is no residual nodular soft tissue within the isthmic resection bed.  Right lobe: Surgically absent. There is fairly well-defined hypoechoic nodular soft tissue measuring approximately 0.5 x 0.4 x 1.0 cm within the right thyroid lobectomy resection bed.  Left lobe: Surgically absent. There is no residual nodular soft tissue within the left lobectomy resection bed.  Benign appearing non pathologically enlarged cervical lymph nodes are seen bilaterally.  Neck U/S (01/03/2018): Redemonstration surgical changes of thyroidectomy. No residual   thyroid tissue identified within the left or right thyroid bed.  Her thyroglobulin levels remain detectable as she did not have RAI treatment (since her tumor was only 1.5 cm), but they have decreased: Lab Results  Component Value Date   THYROGLB 0.2 (L) 12/12/2017   THYROGLB 0.4 (L) 03/01/2017   THYROGLB 0.3 (L) 01/25/2016   THYROGLB 0.8 (L) 02/18/2015   THYROGLB 0.8 (L) 08/18/2014   Lab Results  Component Value Date   THGAB <1 12/12/2017   THGAB <1 03/01/2017   THGAB <1 01/25/2016   THGAB <1 02/18/2015   THGAB 1 08/18/2014   12/2015: Neck U/S 1 year after thyroidectomy:  Post total thyroidectomy with minimal amount (approximately 1 cm) of residual nodular tissue within the right thyroid lobectomy resection bed potentially indicative of residual thyroid tissue though residual/locally recurrent disease is not excluded on the basis this examination. This residual tissue could be amenable to ultrasound-guided fine-needle aspiration as clinically indicated.  Postsurgical hypothyroidism:  At last visit, she was supposed to be on levothyroxine 125 mcg daily, however, the pharmacy refilled the previous prescription for 175 mcg daily and she was taking this for the months prior to our last visit.  Subsequently, TSH was suppressed.  We decreased the dose to 125 mcg daily and her TFTs normalized.  Pt is on levothyroxine 125 mcg daily,  taken: - in am - fasting - +  Coffee with cream approximately 45 minutes later - at least 30 min from b'fast - no Ca, Fe, PPIs - + Prenatal vitamins in the evening - not on Biotin  Reviewed patient's TFTs: Lab Results  Component Value Date   TSH 0.40 01/15/2018   TSH 0.02 (L) 12/12/2017   TSH 0.05 (L) 03/01/2017   TSH 0.43 04/04/2016   TSH 0.37 01/25/2016   TSH 3.26 05/05/2015   TSH 7.52 (H) 02/18/2015   TSH 1.67 11/15/2014   TSH 19.38 (H) 09/24/2014   TSH 13.02 (H) 08/18/2014   FREET4 1.26 01/15/2018   FREET4 1.54 12/12/2017   FREET4 0.93 03/01/2017   FREET4 1.62 (H) 04/04/2016   FREET4 0.95 01/25/2016   FREET4 1.13 05/05/2015   FREET4 0.91 02/18/2015   FREET4 1.11 11/15/2014   FREET4 0.88 09/24/2014   FREET4 0.91 08/18/2014  01/10/2017: TSH 0.03 at OB/GYN office -we decreased the dose of levothyroxine at that time.  Pt denies: - feeling nodules in neck - hoarseness - dysphagia - choking - SOB with lying down  She has + FH of thyroid disorders in: MGM - Hashimoto's ds. No FH of thyroid cancer. No h/o radiation tx to head or neck.  No seaweed or kelp. No recent contrast studies. No herbal supplements. No Biotin use. No recent steroids use.   ROS: Constitutional: no weight gain/no weight loss, no fatigue, no subjective hyperthermia, no subjective hypothermia Eyes: no blurry vision, no xerophthalmia ENT: no sore throat, + see HPI Cardiovascular: no CP/no SOB/+ rarely palpitations/no leg swelling Respiratory: no cough/no SOB/no wheezing Gastrointestinal: no N/no V/no D/no C/no acid reflux Musculoskeletal: no muscle aches/no joint aches Skin: no rashes, no hair loss Neurological: no tremors/no numbness/no tingling/no dizziness  I reviewed pt's medications, allergies, PMH, social hx, family hx, and changes were documented in the history of present illness. Otherwise, unchanged from my initial visit note.  Past Medical History:  Diagnosis Date  . CKD (chronic  kidney disease) stage 1, GFR 90 ml/min or greater 09/2013   mild proteinuria Lauren Morton) sees yearly  . Hashimoto's thyroiditis    confirmed by biopsy s/p thyroidectomy  . Hypothyroidism, acquired, autoimmune   . Papillary thyroid carcinoma ([place]) 03/31/2014   s/p thyroidectomy, 10 negative LN   Past Surgical History:  Procedure Laterality Date  . LYMPH NODE DISSECTION N/A 06/03/2014   Procedure: LIMITED LYMPH NODE DISSECTION;  Surgeon: Armandina Gemma, MD;  Location: [facility];  Service: General;  Laterality: N/A;  . THYROIDECTOMY N/A 06/03/2014   total - 1.5cm papillary thyroid carcinoma with negative surgical margins and 10/10 LN neg for mets; Armandina Gemma, MD  . TOTAL THYROIDECTOMY  06/2014   with limited central lymph node dissection ([place])  . WISDOM TOOTH EXTRACTION     4   Social History   Social History  . Marital Status: Married    Spouse Name: N/A  . Number of Children: 0   Occupational History  . School counselor   Social History Main Topics  . Smoking status: Never Smoker   . Smokeless tobacco: Never Used  . Alcohol Use: Yes     Comment: social use  . Drug Use: No   Social History Narrative   Caffeine: 1 soda at lunch   Lives with husband, 2 dogs and 1 pig, chickens   Occupation: school counselor   Edu: Masters in education   Activity: runs, has done 1/2 marathons   Diet: good water, good fruits/vegetables, red meat 2x/wk, fish 1x/wk   Current Outpatient  Medications on File Prior to Visit  Medication Sig Dispense Refill  . levothyroxine (SYNTHROID, LEVOTHROID) 125 MCG tablet Take 1 tablet (125 mcg total) by mouth daily. 45 tablet 5  . TRI-PREVIFEM 0.18/0.215/0.25 MG-35 MCG tablet Take 1 tablet by mouth daily.  11   No current facility-administered medications on file prior to visit.    No Known Allergies Family History  Problem Relation Age of Onset  . Cancer Mother 14       breast cancer, s/p mastectomy  . Diabetes Father   . Coronary artery disease Maternal  Grandfather        several MIs  . Stroke Neg Hx   . Cancer Maternal Grandmother        breast cancer s/p lumpectomy  . Thyroid disease Maternal Grandmother   . Alcohol abuse Paternal Grandmother    PE: BP 100/60   Pulse 84   Ht '5\' 11"'$  (1.803 m)   Wt 164 lb (74.4 kg)   SpO2 97%   BMI 22.87 kg/m  Body mass index is 22.87 kg/m. Wt Readings from Last 3 Encounters:  06/05/18 164 lb (74.4 kg)  12/12/17 161 lb (73 kg)  01/24/17 165 lb (74.8 kg)   Constitutional: normal weight, in NAD Eyes: PERRLA, EOMI, no exophthalmos ENT: moist mucous membranes, no neck masses palpated, no cervical lymphadenopathy Cardiovascular: RRR, No MRG Respiratory: CTA B Gastrointestinal: abdomen soft, NT, ND, BS+ Musculoskeletal: no deformities, strength intact in all 4 Skin: moist, warm, no rashes Neurological: no tremor with outstretched hands, DTR normal in all 4  ASSESSMENT: 1.  Papillary thyroid cancer (PTC) - see HPI  2. Postsurgical Hypothyroidism  PLAN:  1. Thyroid cancer - papillary -Patient with a 1.5 cm PTC cancer focus, for which she had total thyroidectomy but no RAI treatment due to size and low risk status.  We continue to follow her with thyroglobulin levels and neck ultrasounds.  We reviewed together the report of her neck ultrasound from 12/2015.  This showed a 1 cm right neck mass in the thyroid.  However, she had a new neck ultrasound in 01/2018 and this did not show any masses in her neck.  It is possible that previously seen 1 cm mass was just an area of inflammation. -We reviewed together latest thyroglobulin and ATA antibodies: Her thyroglobulin level is detectable, but continues to decrease, which is excellent.  ATA antibodies were undetectable. -I will repeat these today and then have her back for recheck in 1 year.  2. Patient with history of total thyroidectomy, previously with Hashimoto's hypothyroidism, on levothyroxine therapy. -At last visit, she was taking a higher  levothyroxine dose than recommended (error in refilling her prescription by the pharmacy).  Since then, we decreased the dose back to 125 mcg daily and her TFTs normalized.  Of note, she did have palpitations with a higher dose.  These have resolved. - latest thyroid labs reviewed with pt >> normal 01/2018 - she continues on LT4 125 mcg daily - pt feels good on this dose. - we discussed about taking the thyroid hormone every day, with water, >30 minutes before breakfast, separated by >4 hours from acid reflux medications, calcium, iron, multivitamins. Pt. is taking it correctly. - will check thyroid tests today: TSH and fT4 - If labs are abnormal, she will need to return for repeat TFTs in 1.5 months -She is planning a pregnancy next spring.  We discussed that she needs to have normal thyroid tests before getting pregnant.  We also  discussed about targets for TFTs before and during the pregnancy  Patient Instructions  Please continue Levothyroxine 125 mg daily.  Take the thyroid hormone every day, with water, at least 30 minutes before breakfast, separated by at least 4 hours from: - acid reflux medications - calcium - iron - multivitamins  Please stop at the lab.  Target TSH for pregnancy: - first trimester: <2.5 - afterwards: <3  Please return in 1 year.  Needs refills - [facility] [place].  Component     Latest Ref Rng & Units 06/05/2018  [name]     ng/mL 0.3 (L)  Comment        TSH     0.35 - 4.50 uIU/mL 0.11 (L)  T4,Free(Direct)     0.60 - 1.60 ng/dL 1.36  [name] Ab     < or = 1 IU/mL <1  [name] is detectable, but stable, low.  TSH is too suppressed.  We will go ahead and decrease the dose of levothyroxine to 112 mcg daily and repeat her TFTs in 5 to 6 weeks.  Philemon Kingdom, MD PhD [facility]

## 2018-06-06 LAB — THYROGLOBULIN LEVEL: Thyroglobulin: 0.3 ng/mL — ABNORMAL LOW

## 2018-06-06 LAB — THYROGLOBULIN ANTIBODY: Thyroglobulin Ab: <1 IU/mL (ref <=1)

## 2018-06-06 MED ORDER — LEVOTHYROXINE SODIUM 112 MCG PO TABS: 112.0000 ug | ORAL_TABLET | Freq: Every day | ORAL | 3 refills | Status: DC

## 2018-06-26 ENCOUNTER — Ambulatory Visit: Payer: BC Managed Care – PPO | Admitting: Internal Medicine

## 2018-07-18 ENCOUNTER — Encounter: Payer: BC Managed Care – PPO | Admitting: Family Medicine

## 2018-07-18 ENCOUNTER — Encounter

## 2018-08-27 ENCOUNTER — Other Ambulatory Visit: Payer: Self-pay | Admitting: Internal Medicine

## 2018-12-01 DIAGNOSIS — Z3200 Encounter for pregnancy test, result unknown: Secondary | ICD-10-CM | POA: Diagnosis not present

## 2018-12-02 ENCOUNTER — Encounter: Payer: Self-pay | Admitting: Internal Medicine

## 2018-12-02 ENCOUNTER — Ambulatory Visit (INDEPENDENT_AMBULATORY_CARE_PROVIDER_SITE_OTHER): Payer: BC Managed Care – PPO | Admitting: Internal Medicine

## 2018-12-02 ENCOUNTER — Other Ambulatory Visit: Payer: Self-pay

## 2018-12-02 VITALS — BP 100/60 | HR 74 | Ht 71.0 in | Wt 164.0 lb

## 2018-12-02 DIAGNOSIS — E890 Postprocedural hypothyroidism: Secondary | ICD-10-CM | POA: Diagnosis not present

## 2018-12-02 DIAGNOSIS — C73 Malignant neoplasm of thyroid gland: Secondary | ICD-10-CM

## 2018-12-02 LAB — TSH: TSH: 3.72 u[IU]/mL (ref 0.35–4.50)

## 2018-12-02 LAB — T4, FREE: Free T4: 1.19 ng/dL (ref 0.60–1.60)

## 2018-12-02 NOTE — Progress Notes (Signed)
Patient ID: Lauren Morton, female   DOB: [birth year], 33 y.o.   MRN: [id number]  This visit occurred during the SARS-CoV-2 public health emergency.  Safety protocols were in place, including screening questions prior to the visit, additional usage of staff PPE, and extensive cleaning of exam room while observing appropriate contact time as indicated for disinfecting solutions.   HPI  Lauren Morton is a 33 y.o.-year-old female, returning for f/u for papillary thyroid cancer and postsurgical hypothyroidism. Last visit 6 months ago.  She is now pregnant, [redacted] weeks along.  She did not have her first ultrasound yet with OB/GYN later this month), but she estimates her due date around the end of July.  Reviewed and addended her thyroid cancer history: Pt. has been dx with goiter in ~2006>> thyroid U/S: ? Results but also dx'ed with Hashimoto's thyroiditis with hypothyroidism >> started levothyroxine.  She started to see Dr Danise Mina 03/2014 >> new U/S (04/2014): small R thyroid nodule 1.4 x 1.3 x 1.1 cm, with microcalcifications >>  PTC in 04/2014, by FNA.   - 06/03/2014: Total thyroidectomy -Dr. Harlow Asa. Path: Diagnosis 1. Thyroid, thyroidectomy, total thyroid, suture right superior pole - PAPILLARY THYROID CARCINOMA, 1.5 CM IN GREATEST DIMENSION. - MARGINS ARE NEGATIVE. - BACKGROUND FLORID LYMPHOCYTIC THYROIDITIS. - SEE ONCOLOGY TEMPLATE. 2. Lymph nodes, regional resection, central compartment - TEN BENIGN LYMPH NODES WITH NO TUMOR SEEN (0/10). Microscopic Comment 1. THYROID Specimen: Thyroid with central compartment lymph nodes. Procedure: Total thyroidectomy with limited central compartment lymph dissection. Specimen Integrity (intact/fragmented): Intact. Tumor focality: Unifocal. Dominant tumor: Maximum tumor size (cm): 1.5 cm. Tumor laterality: Tumor involves right superior pole. Histologic type (including subtype and/or unique features as applicable): Papillary thyroid  carcinoma, conventional type. Tumor capsule: Tumor is partially encapsulated. Extrathyroidal extension: No. Margins: Negative. Lymph - Vascular invasion: Definitive lymph/vascular is not identified. Capsular invasion with degree of invasion if present: No capsular invasion identified. Lymph nodes: # examined 10; # positive 0. TNM code: pT1b, pN0. Non-neoplastic thyroid: Florid lymphocytic thyroiditis. (RH:ds 06/04/14)  Neck U/S (12/28/2018): Isthmus: Surgically absent. There is no residual nodular soft tissue within the isthmic resection bed.  Right lobe: Surgically absent. There is fairly well-defined hypoechoic nodular soft tissue measuring approximately 0.5 x 0.4 x 1.0 cm within the right thyroid lobectomy resection bed.  Left lobe: Surgically absent. There is no residual nodular soft tissue within the left lobectomy resection bed.  Benign appearing non pathologically enlarged cervical lymph nodes are seen bilaterally.  Neck U/S (01/03/2018): Redemonstration surgical changes of thyroidectomy. No residual   thyroid tissue identified within the left or right thyroid bed.  Her thyroglobulin levels remain detectable as she did not have RAI treatment (since her tumor was only 1.5 cm), but they decreased to: Lab Results  Component Value Date   THYROGLB 0.3 (L) 06/05/2018   THYROGLB 0.2 (L) 12/12/2017   THYROGLB 0.4 (L) 03/01/2017   THYROGLB 0.3 (L) 01/25/2016   THYROGLB 0.8 (L) 02/18/2015   THYROGLB 0.8 (L) 08/18/2014   Lab Results  Component Value Date   THGAB <1 06/05/2018   THGAB <1 12/12/2017   THGAB <1 03/01/2017   THGAB <1 01/25/2016   THGAB <1 02/18/2015   THGAB 1 08/18/2014   12/2015: Neck U/S 1 year after thyroidectomy:  Post total thyroidectomy with minimal amount (approximately 1 cm) of residual nodular tissue within the right thyroid lobectomy resection bed potentially indicative of residual thyroid tissue though residual/locally recurrent disease is not  excluded on the basis this  examination. This residual tissue could be amenable to ultrasound-guided fine-needle aspiration as clinically indicated.  Postsurgical hypothyroidism:  In 12/2017, she was supposed to be on levothyroxine 125 mcg daily, however, the pharmacy refilled the previous prescription for 175 mcg daily and she was taking this for the months prior to our last visit.  Subsequently, TSH was suppressed.  We decreased the dose to 125 mcg daily and her TFTs normalized in 01/2018.  However, at last visit, TSH was suppressed and we decreased the dose of her levothyroxine.  She did not return for repeat labs 1.5 months later...  She takes levothyroxine 112 mcg daily, dose decreased at last visit: - in am - fasting - + coffee with cream approximately 45 minutes later - at least 30 min from b'fast - no Ca, Fe, PPIs - + Prenatal vitamins in the evening - not on Biotin  Reviewed her TFTs: Lab Results  Component Value Date   TSH 0.11 (L) 06/05/2018   TSH 0.40 01/15/2018   TSH 0.02 (L) 12/12/2017   TSH 0.05 (L) 03/01/2017   TSH 0.43 04/04/2016   TSH 0.37 01/25/2016   TSH 3.26 05/05/2015   TSH 7.52 (H) 02/18/2015   TSH 1.67 11/15/2014   TSH 19.38 (H) 09/24/2014   FREET4 1.36 06/05/2018   FREET4 1.26 01/15/2018   FREET4 1.54 12/12/2017   FREET4 0.93 03/01/2017   FREET4 1.62 (H) 04/04/2016   FREET4 0.95 01/25/2016   FREET4 1.13 05/05/2015   FREET4 0.91 02/18/2015   FREET4 1.11 11/15/2014   FREET4 0.88 09/24/2014  01/10/2017: TSH 0.03 at OB/GYN office -we decreased the dose of levothyroxine at that time.  Pt denies: - feeling nodules in neck - hoarseness - dysphagia - choking - SOB with lying down  She has + FH of thyroid disorders in: MGM - Hashimoto's ds. No FH of thyroid cancer. No h/o radiation tx to head or neck.  No seaweed or kelp. No recent contrast studies. No herbal supplements. No Biotin use. No recent steroids use.   ROS: Constitutional: no weight gain/no  weight loss, no fatigue, no subjective hyperthermia, no subjective hypothermia Eyes: no blurry vision, no xerophthalmia ENT: no sore throat, + see HPI Cardiovascular: no CP/no SOB/no palpitations/no leg swelling Respiratory: no cough/no SOB/no wheezing Gastrointestinal: no N/no V/no D/no C/no acid reflux Musculoskeletal: no muscle aches/no joint aches Skin: no rashes, no hair loss Neurological: no tremors/no numbness/no tingling/no dizziness  I reviewed pt's medications, allergies, PMH, social hx, family hx, and changes were documented in the history of present illness. Otherwise, unchanged from my initial visit note.  Past Medical History:  Diagnosis Date  . CKD (chronic kidney disease) stage 1, GFR 90 ml/min or greater 09/2013   mild proteinuria [facility]) sees yearly  . Hashimoto's thyroiditis    confirmed by biopsy s/p thyroidectomy  . Hypothyroidism, acquired, autoimmune   . Papillary thyroid carcinoma ([place]) 03/31/2014   s/p thyroidectomy, 10 negative LN   Past Surgical History:  Procedure Laterality Date  . LYMPH NODE DISSECTION N/A 06/03/2014   Procedure: LIMITED LYMPH NODE DISSECTION;  Surgeon: Armandina Gemma, MD;  Location: [facility];  Service: General;  Laterality: N/A;  . THYROIDECTOMY N/A 06/03/2014   total - 1.5cm papillary thyroid carcinoma with negative surgical margins and 10/10 LN neg for mets; Armandina Gemma, MD  . TOTAL THYROIDECTOMY  06/2014   with limited central lymph node dissection ([place])  . WISDOM TOOTH EXTRACTION     4   Social History   Social History  .  Marital Status: Married    Spouse Name: N/A  . Number of Children: 0   Occupational History  . School counselor   Social History Main Topics  . Smoking status: Never Smoker   . Smokeless tobacco: Never Used  . Alcohol Use: Yes     Comment: social use  . Drug Use: No   Social History Narrative   Caffeine: 1 soda at lunch   Lives with husband, 2 dogs and 1 pig, chickens   Occupation: Animal nutritionist    Edu: Masters in education   Activity: runs, has done 1/2 marathons   Diet: good water, good fruits/vegetables, red meat 2x/wk, fish 1x/wk   Current Outpatient Medications on File Prior to Visit  Medication Sig Dispense Refill  . levothyroxine (SYNTHROID) 112 MCG tablet Take 1 tablet (112 mcg total) by mouth daily. 90 tablet 3   No current facility-administered medications on file prior to visit.    No Known Allergies Family History  Problem Relation Age of Onset  . Cancer Mother 45       breast cancer, s/p mastectomy  . Diabetes Father   . Coronary artery disease Maternal Grandfather        several MIs  . Stroke Neg Hx   . Cancer Maternal Grandmother        breast cancer s/p lumpectomy  . Thyroid disease Maternal Grandmother   . Alcohol abuse Paternal Grandmother    PE: BP 100/60   Pulse 74   Ht _0  (1.803 m)   Wt 164 lb (74.4 kg)   LMP 10/29/2018   SpO2 99%   BMI 22.87 kg/m  Body mass index is 22.87 kg/m. Wt Readings from Last 3 Encounters:  12/02/18 164 lb (74.4 kg)  06/05/18 164 lb (74.4 kg)  12/12/17 161 lb (73 kg)   Constitutional:normal weight, in NAD Eyes: PERRLA, EOMI, no exophthalmos ENT: moist mucous membranes, no neck masses palpated, thyroidectomy scar healed, no cervical lymphadenopathy Cardiovascular: RRR, No MRG Respiratory: CTA B Gastrointestinal: abdomen soft, NT, ND, BS+ Musculoskeletal: no deformities, strength intact in all 4 Skin: moist, warm, no rashes Neurological: no tremor with outstretched hands, DTR normal in all 4   ASSESSMENT: 1.  Papillary thyroid cancer (PTC) - see HPI  2. Postsurgical Hypothyroidism  PLAN:  1. Thyroid cancer - papillary -Patient with a 1.5 cm PTC cancer focus, for which she had total thyroidectomy but no RAI treatment due to size and low risk status.  We continue to follow her with thyroglobulin levels and neck ultrasounds.  We reviewed together the report of her neck ultrasound from 12/2015.  This showed  a 1 cm right neck mass in the thyroid.  However, she had a new neck ultrasound in 01/2018 and this did not show any masses in her neck.  It is possible that the previously seen 1 cm mass was just an area of inflammation. -We reviewed together latest thyroglobulin and ATA antibodies: Her thyroglobulin level was detectable (expected since she did not have RAI treatment) but lower than before and it continues to decrease.  ATA antibodies were undetectable. -We will repeat these after the pregnancy  2. Patient with history of total thyroidectomy, previously with Hashimoto's hypothyroidism on levothyroxine therapy - latest thyroid labs reviewed with pt >> suppressed, so we decreased her levothyroxine dose: Lab Results  Component Value Date   TSH 0.11 (L) 06/05/2018  - She did not return for labs after the decrease in dose.... - she continues on LT4 112  mcg daily - pt feels good on this dose.  However, we will most likely need to change it since she is now pregnant, in the first trimester.  We discussed that we usually 2 extra tablets a week during pregnancy. - We discussed about TSH targets for pregnancy depending on the trimester (see below) - we discussed about taking the thyroid hormone every day, with water, >30 minutes before breakfast, separated by >4 hours from acid reflux medications, calcium, iron, multivitamins. Pt. is taking it correctly. - will check thyroid tests today and in 4 weeks from now: TSH and fT4 - I we will see her back in 3 months.  Patient Instructions  Please continue levothyroxine 112 mcg daily.  Take the thyroid hormone every day, with water, at least 30 minutes before breakfast, separated by at least 4 hours from: - acid reflux medications - calcium - iron - multivitamins  Please stop at the lab.  Target TSH for pregnancy: - first trimester: <2.5 - afterwards: <3  Please return in 3 months.  Component     Latest Ref Rng & Units 12/02/2018  TSH     0.35 -  4.50 uIU/mL 3.72  T4,Free(Direct)     0.60 - 1.60 ng/dL 1.19   TSH is normal but above target for pregnancy. Will increase her levothyroxine to 9 tablets a week and recheck in 4 weeks.  Philemon Kingdom, MD PhD [facility]

## 2018-12-02 NOTE — Patient Instructions (Addendum)
Please continue levothyroxine 112 mcg daily.  After the results are back, we most likely need to increase the dose by 2 tablets a week.   Take the thyroid hormone every day, with water, at least 30 minutes before breakfast, separated by at least 4 hours from: - acid reflux medications - calcium - iron - multivitamins  Please stop at the lab.  Target TSH for pregnancy: - first trimester: <2.5 - afterwards: <3  Please return in 5-6 months.

## 2018-12-17 DIAGNOSIS — N911 Secondary amenorrhea: Secondary | ICD-10-CM | POA: Diagnosis not present

## 2018-12-24 DIAGNOSIS — Z319 Encounter for procreative management, unspecified: Secondary | ICD-10-CM | POA: Diagnosis not present

## 2018-12-24 DIAGNOSIS — Z3481 Encounter for supervision of other normal pregnancy, first trimester: Secondary | ICD-10-CM | POA: Diagnosis not present

## 2018-12-24 DIAGNOSIS — Z31430 Encounter of female for testing for genetic disease carrier status for procreative management: Secondary | ICD-10-CM | POA: Diagnosis not present

## 2018-12-24 DIAGNOSIS — Z3685 Encounter for antenatal screening for Streptococcus B: Secondary | ICD-10-CM | POA: Diagnosis not present

## 2019-01-02 DIAGNOSIS — I4719 Other supraventricular tachycardia: Secondary | ICD-10-CM

## 2019-01-02 DIAGNOSIS — I471 Supraventricular tachycardia: Secondary | ICD-10-CM

## 2019-01-02 HISTORY — DX: Other supraventricular tachycardia: I47.19

## 2019-01-02 HISTORY — DX: Supraventricular tachycardia: I47.1

## 2019-01-07 ENCOUNTER — Other Ambulatory Visit (INDEPENDENT_AMBULATORY_CARE_PROVIDER_SITE_OTHER): Payer: BC Managed Care – PPO

## 2019-01-07 ENCOUNTER — Other Ambulatory Visit: Payer: Self-pay

## 2019-01-07 DIAGNOSIS — E890 Postprocedural hypothyroidism: Secondary | ICD-10-CM | POA: Diagnosis not present

## 2019-01-07 LAB — TSH: TSH: 5.22 u[IU]/mL — ABNORMAL HIGH (ref 0.35–4.50)

## 2019-01-07 LAB — T4, FREE: Free T4: 0.93 ng/dL (ref 0.60–1.60)

## 2019-01-08 ENCOUNTER — Other Ambulatory Visit: Payer: Self-pay | Admitting: Internal Medicine

## 2019-01-08 DIAGNOSIS — Z331 Pregnant state, incidental: Secondary | ICD-10-CM | POA: Diagnosis not present

## 2019-01-08 DIAGNOSIS — Z3401 Encounter for supervision of normal first pregnancy, first trimester: Secondary | ICD-10-CM | POA: Diagnosis not present

## 2019-01-08 DIAGNOSIS — E890 Postprocedural hypothyroidism: Secondary | ICD-10-CM

## 2019-01-08 MED ORDER — LEVOTHYROXINE SODIUM 175 MCG PO TABS: 175.0000 ug | ORAL_TABLET | Freq: Every day | ORAL | 3 refills | Status: DC

## 2019-01-27 DIAGNOSIS — Z3A12 12 weeks gestation of pregnancy: Secondary | ICD-10-CM | POA: Diagnosis not present

## 2019-01-27 DIAGNOSIS — Z3481 Encounter for supervision of other normal pregnancy, first trimester: Secondary | ICD-10-CM | POA: Diagnosis not present

## 2019-01-27 DIAGNOSIS — Z3682 Encounter for antenatal screening for nuchal translucency: Secondary | ICD-10-CM | POA: Diagnosis not present

## 2019-02-03 DIAGNOSIS — Z20828 Contact with and (suspected) exposure to other viral communicable diseases: Secondary | ICD-10-CM | POA: Diagnosis not present

## 2019-02-03 DIAGNOSIS — Z03818 Encounter for observation for suspected exposure to other biological agents ruled out: Secondary | ICD-10-CM | POA: Diagnosis not present

## 2019-03-02 ENCOUNTER — Other Ambulatory Visit: Payer: Self-pay | Admitting: Internal Medicine

## 2019-03-02 ENCOUNTER — Other Ambulatory Visit (INDEPENDENT_AMBULATORY_CARE_PROVIDER_SITE_OTHER): Payer: BC Managed Care – PPO

## 2019-03-02 ENCOUNTER — Other Ambulatory Visit: Payer: Self-pay

## 2019-03-02 DIAGNOSIS — E890 Postprocedural hypothyroidism: Secondary | ICD-10-CM | POA: Diagnosis not present

## 2019-03-02 LAB — TSH: TSH: 0.19 u[IU]/mL — ABNORMAL LOW (ref 0.35–4.50)

## 2019-03-02 LAB — T4, FREE: Free T4: 1.53 ng/dL (ref 0.60–1.60)

## 2019-03-02 MED ORDER — LEVOTHYROXINE SODIUM 150 MCG PO TABS: 150.0000 ug | ORAL_TABLET | Freq: Every day | ORAL | 3 refills | Status: DC

## 2019-03-10 DIAGNOSIS — Z361 Encounter for antenatal screening for raised alphafetoprotein level: Secondary | ICD-10-CM | POA: Diagnosis not present

## 2019-03-10 DIAGNOSIS — Z363 Encounter for antenatal screening for malformations: Secondary | ICD-10-CM | POA: Diagnosis not present

## 2019-03-10 DIAGNOSIS — Z3A18 18 weeks gestation of pregnancy: Secondary | ICD-10-CM | POA: Diagnosis not present

## 2019-03-12 DIAGNOSIS — R809 Proteinuria, unspecified: Secondary | ICD-10-CM | POA: Diagnosis not present

## 2019-03-12 DIAGNOSIS — E039 Hypothyroidism, unspecified: Secondary | ICD-10-CM | POA: Diagnosis not present

## 2019-03-12 DIAGNOSIS — R3129 Other microscopic hematuria: Secondary | ICD-10-CM | POA: Diagnosis not present

## 2019-03-12 DIAGNOSIS — N181 Chronic kidney disease, stage 1: Secondary | ICD-10-CM | POA: Diagnosis not present

## 2019-03-12 DIAGNOSIS — N390 Urinary tract infection, site not specified: Secondary | ICD-10-CM | POA: Diagnosis not present

## 2019-03-16 DIAGNOSIS — R809 Proteinuria, unspecified: Secondary | ICD-10-CM | POA: Diagnosis not present

## 2019-03-28 ENCOUNTER — Other Ambulatory Visit: Payer: Self-pay

## 2019-03-28 ENCOUNTER — Inpatient Hospital Stay (HOSPITAL_COMMUNITY)
Admission: AD | Admit: 2019-03-28 | Discharge: 2019-03-28 | Disposition: A | Discharge status: Home / Self Care | Payer: BC Managed Care – PPO | Source: Ambulatory Visit | Attending: Emergency Medicine | Admitting: Emergency Medicine

## 2019-03-28 ENCOUNTER — Inpatient Hospital Stay (HOSPITAL_COMMUNITY): Payer: BC Managed Care – PPO

## 2019-03-28 ENCOUNTER — Encounter (HOSPITAL_COMMUNITY): Payer: Self-pay | Admitting: Obstetrics and Gynecology

## 2019-03-28 DIAGNOSIS — I471 Supraventricular tachycardia: Secondary | ICD-10-CM

## 2019-03-28 DIAGNOSIS — R0602 Shortness of breath: Secondary | ICD-10-CM | POA: Insufficient documentation

## 2019-03-28 DIAGNOSIS — Z3A22 22 weeks gestation of pregnancy: Secondary | ICD-10-CM | POA: Diagnosis not present

## 2019-03-28 DIAGNOSIS — O2692 Pregnancy related conditions, unspecified, second trimester: Secondary | ICD-10-CM | POA: Insufficient documentation

## 2019-03-28 DIAGNOSIS — E038 Other specified hypothyroidism: Secondary | ICD-10-CM | POA: Diagnosis not present

## 2019-03-28 DIAGNOSIS — R0789 Other chest pain: Secondary | ICD-10-CM | POA: Diagnosis not present

## 2019-03-28 DIAGNOSIS — R002 Palpitations: Secondary | ICD-10-CM | POA: Diagnosis not present

## 2019-03-28 DIAGNOSIS — N181 Chronic kidney disease, stage 1: Secondary | ICD-10-CM | POA: Insufficient documentation

## 2019-03-28 DIAGNOSIS — O99412 Diseases of the circulatory system complicating pregnancy, second trimester: Secondary | ICD-10-CM | POA: Diagnosis not present

## 2019-03-28 DIAGNOSIS — Z85850 Personal history of malignant neoplasm of thyroid: Secondary | ICD-10-CM | POA: Diagnosis not present

## 2019-03-28 LAB — COMPREHENSIVE METABOLIC PANEL
ALT: 30 U/L (ref 0–44)
AST: 24 U/L (ref 15–41)
Albumin: 2.7 g/dL — ABNORMAL LOW (ref 3.5–5.0)
Alkaline Phosphatase: 32 U/L — ABNORMAL LOW (ref 38–126)
Anion gap: 10 (ref 5–15)
BUN: 7 mg/dL (ref 6–20)
CO2: 20 mmol/L — ABNORMAL LOW (ref 22–32)
Calcium: 8.1 mg/dL — ABNORMAL LOW (ref 8.9–10.3)
Chloride: 108 mmol/L (ref 98–111)
Creatinine, Ser: 0.72 mg/dL (ref 0.44–1.00)
GFR calc Af Amer: >60 mL/min (ref >60)
GFR calc non Af Amer: >60 mL/min (ref >60)
Glucose, Bld: 104 mg/dL — ABNORMAL HIGH (ref 70–99)
Potassium: 3.8 mmol/L (ref 3.5–5.1)
Sodium: 138 mmol/L (ref 135–145)
Total Bilirubin: 0.6 mg/dL (ref 0.3–1.2)
Total Protein: 5.3 g/dL — ABNORMAL LOW (ref 6.5–8.1)

## 2019-03-28 LAB — TSH: TSH: 14.071 u[IU]/mL — ABNORMAL HIGH (ref 0.350–4.500)

## 2019-03-28 LAB — CBC WITH DIFFERENTIAL/PLATELET
Abs Immature Granulocytes: 0.06 10*3/uL (ref 0.00–0.07)
Basophils Absolute: 0 10*3/uL (ref 0.0–0.1)
Basophils Relative: 0 %
Eosinophils Absolute: 0 10*3/uL (ref 0.0–0.5)
Eosinophils Relative: 0 %
HCT: 38.5 % (ref 36.0–46.0)
Hemoglobin: 12.9 g/dL (ref 12.0–15.0)
Immature Granulocytes: 0 %
Lymphocytes Relative: 16 %
Lymphs Abs: 2.2 10*3/uL (ref 0.7–4.0)
MCH: 30.6 pg (ref 26.0–34.0)
MCHC: 33.5 g/dL (ref 30.0–36.0)
MCV: 91.4 fL (ref 80.0–100.0)
Monocytes Absolute: 1 10*3/uL (ref 0.1–1.0)
Monocytes Relative: 7 %
Neutro Abs: 10.5 10*3/uL — ABNORMAL HIGH (ref 1.7–7.7)
Neutrophils Relative %: 77 %
Platelets: 239 10*3/uL (ref 150–400)
RBC: 4.21 MIL/uL (ref 3.87–5.11)
RDW: 12.8 % (ref 11.5–15.5)
WBC: 13.8 10*3/uL — ABNORMAL HIGH (ref 4.0–10.5)
nRBC: 0 % (ref 0.0–0.2)

## 2019-03-28 LAB — TYPE AND SCREEN
ABO/RH(D): A POS
Antibody Screen: NEGATIVE

## 2019-03-28 LAB — URINALYSIS, ROUTINE W REFLEX MICROSCOPIC
Bacteria, UA: NONE SEEN
Bilirubin Urine: NEGATIVE
Glucose, UA: NEGATIVE mg/dL
Hgb urine dipstick: NEGATIVE
Ketones, ur: NEGATIVE mg/dL
Leukocytes,Ua: NEGATIVE
Nitrite: NEGATIVE
Protein, ur: 30 mg/dL — AB
Specific Gravity, Urine: 1.004 — ABNORMAL LOW (ref 1.005–1.030)
pH: 7 (ref 5.0–8.0)

## 2019-03-28 LAB — PROTIME-INR
INR: 1 (ref 0.8–1.2)
Prothrombin Time: 13.5 seconds (ref 11.4–15.2)

## 2019-03-28 LAB — MAGNESIUM: Magnesium: 1.6 mg/dL — ABNORMAL LOW (ref 1.7–2.4)

## 2019-03-28 LAB — T4, FREE: Free T4: 0.66 ng/dL (ref 0.61–1.12)

## 2019-03-28 LAB — BRAIN NATRIURETIC PEPTIDE: B Natriuretic Peptide: 516.1 pg/mL — ABNORMAL HIGH (ref 0.0–100.0)

## 2019-03-28 LAB — ABO/RH: ABO/RH(D): A POS

## 2019-03-28 MED ORDER — ADENOSINE 6 MG/2ML IV SOLN
6.0000 mg | Freq: Once | INTRAVENOUS | Status: AC
  Administered 2019-03-28: 6 mg via INTRAVENOUS
  Filled 2019-03-28: qty 2

## 2019-03-28 MED ORDER — MAGNESIUM OXIDE 400 (241.3 MG) MG PO TABS
800.0000 mg | ORAL_TABLET | Freq: Once | ORAL | Status: AC
  Administered 2019-03-28: 800 mg via ORAL
  Filled 2019-03-28: qty 2

## 2019-03-28 NOTE — ED Notes (Signed)
Patient verbalizes understanding of discharge instructions. Opportunity for questioning and answers were provided. Pt discharged from ED. 

## 2019-03-28 NOTE — MAU Note (Signed)
Lauren Morton is a 34 y.o. at [redacted]w[redacted]d here in MAU reporting: accelerated HR for the past 12-14 hours. It has been 180-190. States this has happened before and is on thyroid medication and dr has been adjusting it.  Onset of complaint: the past 12-14 hours  Pain score: 0/10  Vitals:   03/28/19 1430  BP: 98/68  Pulse: (!) 187  Resp: 16  Temp: 97.6 F (36.4 C)  SpO2: 100%     FHT: 164  Lab orders placed from triage: UA

## 2019-03-28 NOTE — Progress Notes (Addendum)
Patient Lauren Morton is a 34 y.o. G1P0  at [redacted]w[redacted]d here with complaints of tachycardia since midnight last night., She has a history of thyroid cancer; s/p thyroidectomy in 2016. She also has a proteiniuria , which has been monitored over time (per patient). She denies any vaginal bleeding, vaginal discharge, contractions.   Dosage was changed to 150 mcg of synthroid three weeks ago; patient states that she felt like she was having tachycardia at that time and that it resolved spontaneously. Last visit to [facility] for proteinuria was three weeks ago when.   She denies chest pain, blurry vision, NV, SOB, fever, cough. Husband reports that patient feels faint.  Patient's husband's sister is an Therapist, sports who recommended that she come to ED. She called [facility] and was told to come by the on call nurse (per patient) to come to MAU.    FHT was 160s by Doppler in MAU; VSS, patient is alert and oriented times 3, in no apparent distress.   Spoke with ED provider about patient's complaint; patient will be transported by RN and immediately roomed in ED.   Maye Hides

## 2019-03-28 NOTE — ED Provider Notes (Signed)
[facility] EMERGENCY DEPARTMENT Provider Note   CSN: [id number] Arrival date & time: 03/28/19  1414     History Chief Complaint  Patient presents with  . Tachycardia    Lauren Morton is a 34 y.o. female.  HPI     34 year old G1, P0 about [redacted] weeks pregnant female with history of CKD, Hashimoto's thyroiditis and thyroid carcinoma status post thyroid resection who is on 150 mcg of levothyroxine comes in with chief complaint of elevated heart rate.  Patient reports that around 10:00 last night she started noticing that her heart was racing.  She has been having exertional shortness of breath since then.  She is also noticing chest tightness and dizziness.  She had a similar episode few weeks back when her heart rate improved on its own.  She has no underlying cardiac disease.  She denies any history of PE, DVT, family history of clots or tachycardia dysrhythmia in young people.  Review of system is negative for any vaginal bleeding, new abdominal pain, back pain.  Patient is seeing urologist for protein and blood in the urine.  She was treated for UTI recently for asymptomatic bacteriuria.   Past Medical History:  Diagnosis Date  . CKD (chronic kidney disease) stage 1, GFR 90 ml/min or greater 09/2013   mild proteinuria [facility]) sees yearly  . Hashimoto's thyroiditis    confirmed by biopsy s/p thyroidectomy  . Hypothyroidism, acquired, autoimmune   . Papillary thyroid carcinoma ([place]) 03/31/2014   s/p thyroidectomy, 10 negative LN    Patient Active Problem List   Diagnosis Date Noted  . Postsurgical hypothyroidism 08/18/2014  . Health maintenance examination 03/31/2014  . Papillary thyroid carcinoma ([place]) 03/31/2014  . CKD (chronic kidney disease) stage 1, GFR 90 ml/min or greater 09/01/2013  . History of Hashimoto thyroiditis 03/24/2013    Past Surgical History:  Procedure Laterality Date  . LYMPH NODE DISSECTION N/A 06/03/2014   Procedure:  LIMITED LYMPH NODE DISSECTION;  Surgeon: Armandina Gemma, MD;  Location: [facility];  Service: General;  Laterality: N/A;  . THYROIDECTOMY N/A 06/03/2014   total - 1.5cm papillary thyroid carcinoma with negative surgical margins and 10/10 LN neg for mets; Armandina Gemma, MD  . TOTAL THYROIDECTOMY  06/2014   with limited central lymph node dissection ([place])  . WISDOM TOOTH EXTRACTION     4     OB History    Gravida  1   Para      Term      Preterm      AB      Living        SAB      TAB      Ectopic      Multiple      Live Births              Family History  Problem Relation Age of Onset  . Cancer Mother 33       breast cancer, s/p mastectomy  . Diabetes Father   . Coronary artery disease Maternal Grandfather        several MIs  . Cancer Maternal Grandmother        breast cancer s/p lumpectomy  . Thyroid disease Maternal Grandmother   . Alcohol abuse Paternal Grandmother   . Stroke Neg Hx     Social History   Tobacco Use  . Smoking status: Never Smoker  . Smokeless tobacco: Never Used  Substance Use Topics  . Alcohol use: Not  Currently  . Drug use: No    Home Medications Prior to Admission medications   Medication Sig Start Date End Date Taking? Authorizing Provider  levothyroxine (SYNTHROID) 150 MCG tablet Take 1 tablet (150 mcg total) by mouth daily. 03/02/19   Philemon Kingdom, MD    Allergies    Patient has no known allergies.  Review of Systems   Review of Systems  Constitutional: Positive for activity change.  Respiratory: Positive for shortness of breath.   Cardiovascular: Positive for chest pain.  Gastrointestinal: Negative for nausea and vomiting.  Genitourinary: Negative for dysuria and flank pain.  Musculoskeletal: Negative for back pain.  Allergic/Immunologic: Negative for immunocompromised state.  Neurological: Positive for light-headedness.  Hematological: Does not bruise/bleed easily.  All other systems reviewed and are  negative.   Physical Exam Updated Vital Signs BP 105/81   Pulse 90   Temp 97.6 F (36.4 C) (Oral)   Resp 18   Ht 5\' 11"  (1.803 m)   Wt 81.6 kg   LMP 10/29/2018   SpO2 100%   BMI 25.10 kg/m   Physical Exam Vitals and nursing note reviewed.  Constitutional:      Appearance: She is well-developed.  HENT:     Head: Normocephalic and atraumatic.  Cardiovascular:     Rate and Rhythm: Regular rhythm. Tachycardia present.     Pulses: Normal pulses.  Pulmonary:     Effort: Pulmonary effort is normal.  Abdominal:     General: Bowel sounds are normal.  Musculoskeletal:        General: No swelling or tenderness.     Cervical back: Normal range of motion and neck supple.     Right lower leg: No edema.     Left lower leg: No edema.  Skin:    General: Skin is warm and dry.  Neurological:     Mental Status: She is alert and oriented to person, place, and time.     ED Results / Procedures / Treatments   Labs (all labs ordered are listed, but only abnormal results are displayed) Labs Reviewed  URINALYSIS, ROUTINE W REFLEX MICROSCOPIC - Abnormal; Notable for the following components:      Result Value   Color, Urine STRAW (*)    Specific Gravity, Urine 1.004 (*)    Protein, ur 30 (*)    All other components within normal limits  COMPREHENSIVE METABOLIC PANEL - Abnormal; Notable for the following components:   CO2 20 (*)    Glucose, Bld 104 (*)    Calcium 8.1 (*)    Total Protein 5.3 (*)    Albumin 2.7 (*)    Alkaline Phosphatase 32 (*)    All other components within normal limits  CBC WITH DIFFERENTIAL/PLATELET - Abnormal; Notable for the following components:   WBC 13.8 (*)    Neutro Abs 10.5 (*)    All other components within normal limits  BRAIN NATRIURETIC PEPTIDE - Abnormal; Notable for the following components:   B Natriuretic Peptide 516.1 (*)    All other components within normal limits  MAGNESIUM - Abnormal; Notable for the following components:   Magnesium  1.6 (*)    All other components within normal limits  TSH - Abnormal; Notable for the following components:   TSH 14.071 (*)    All other components within normal limits  PROTIME-INR  T4, FREE  TYPE AND SCREEN  ABO/RH    EKG EKG Interpretation  Date/Time:  Saturday March 28 2019 15:03:53 EDT Ventricular Rate:  169 PR Interval:    QRS Duration: 103 QT Interval:  255 QTC Calculation: 428 R Axis:   44 Text Interpretation: Aflutter vs. PSVT Probable anteroseptal infarct, recent No acute changes Confirmed by Varney Biles [phone]) on 03/28/2019 4:26:13 PM   Radiology No results found.  Procedures .Critical Care Performed by: Varney Biles, MD Authorized by: Varney Biles, MD   Critical care provider statement:    Critical care time (minutes):  78   Critical care was necessary to treat or prevent imminent or life-threatening deterioration of the following conditions:  Cardiac failure   Critical care was time spent personally by me on the following activities:  Discussions with consultants, evaluation of patient's response to treatment, examination of patient, ordering and performing treatments and interventions, ordering and review of laboratory studies, ordering and review of radiographic studies, pulse oximetry, re-evaluation of patient's condition, obtaining history from patient or surrogate and review of old charts   (including critical care time)  Medications Ordered in ED Medications  adenosine (ADENOCARD) 6 MG/2ML injection 6 mg (6 mg Intravenous Given 03/28/19 1659)  magnesium oxide (MAG-OX) tablet 800 mg (800 mg Oral Given 03/28/19 1741)    ED Course  I have reviewed the triage vital signs and the nursing notes.  Pertinent labs & imaging results that were available during my care of the patient were reviewed by me and considered in my medical decision making (see chart for details).    MDM Rules/Calculators/A&P                      34 year old comes in a  chief complaint of elevated heart rate.  She is G1 P0 female who is almost [redacted] weeks pregnant.  She is complaining of exertional shortness of breath, chest tightness and dizziness.  Symptoms started last night.  She had similar episode earlier in pregnancy that resolved on its own.  She has no underlying structural heart disease and she is status post thyroidectomy, started on levothyroxine.   Differential diagnosis includes: Primary cardiac arrhythmia such as PSVT, A. Fib/flutter, MAT. Other possibilities include thyroid storm, which is highly unlikely.  We also considered PE, severe anemia in the differential as well.  Doubt internal bleeding at this time.Marland Kitchen  Appropriate labs ordered.   Reassessment: EKG consistent with PSVT versus atrial flutter. Patient consented for pharmacologic cardioversion via adenosine.  After 6 mg of adenosine she has converted to sinus rhythm. I will cancel the ultrasound DVT, as I do not think she needs it.  I suspect that the PSVT was just a sequelae of her pregnancy related hemodynamic, cardiac changes.  We advised her to follow-up with her PCP or OB about this.  Final Clinical Impression(s) / ED Diagnoses Final diagnoses:  SVT (supraventricular tachycardia) ([place])  Palpitations    Rx / DC Orders ED Discharge Orders    None       Varney Biles, MD 03/30/19 2026

## 2019-03-28 NOTE — Discharge Instructions (Addendum)
Follow up with your primary doctor. Return to the ER if the symptoms return.

## 2019-03-28 NOTE — ED Triage Notes (Addendum)
Pt from MAU; came in for evaluation of tachycardia while sitting, began at 10 pm last night; monitor at home, HR 180-190, on thyroid meds, has been adjusting dose; no pregnancy related complaints (pt 21 weeks and 3 days pregnant); endorses some chest tightness, denies sob, endorses feeling "faint"; hx of same

## 2019-03-28 NOTE — MAU Note (Signed)
Report given to [facility] in [facility] ED.

## 2019-04-28 DIAGNOSIS — R3129 Other microscopic hematuria: Secondary | ICD-10-CM | POA: Diagnosis not present

## 2019-04-28 DIAGNOSIS — N181 Chronic kidney disease, stage 1: Secondary | ICD-10-CM | POA: Diagnosis not present

## 2019-04-28 DIAGNOSIS — E039 Hypothyroidism, unspecified: Secondary | ICD-10-CM | POA: Diagnosis not present

## 2019-04-28 DIAGNOSIS — R809 Proteinuria, unspecified: Secondary | ICD-10-CM | POA: Diagnosis not present

## 2019-05-03 ENCOUNTER — Encounter (HOSPITAL_COMMUNITY): Payer: Self-pay | Admitting: Obstetrics and Gynecology

## 2019-05-03 ENCOUNTER — Other Ambulatory Visit: Payer: Self-pay

## 2019-05-03 ENCOUNTER — Emergency Department (HOSPITAL_COMMUNITY)
Admission: AD | Admit: 2019-05-03 | Discharge: 2019-05-03 | Disposition: A | Discharge status: Home / Self Care | Payer: BC Managed Care – PPO | Source: Ambulatory Visit | Attending: Emergency Medicine | Admitting: Emergency Medicine

## 2019-05-03 DIAGNOSIS — E038 Other specified hypothyroidism: Secondary | ICD-10-CM | POA: Diagnosis not present

## 2019-05-03 DIAGNOSIS — Z85850 Personal history of malignant neoplasm of thyroid: Secondary | ICD-10-CM | POA: Diagnosis not present

## 2019-05-03 DIAGNOSIS — R000 Tachycardia, unspecified: Secondary | ICD-10-CM | POA: Diagnosis not present

## 2019-05-03 DIAGNOSIS — O09522 Supervision of elderly multigravida, second trimester: Secondary | ICD-10-CM

## 2019-05-03 DIAGNOSIS — N181 Chronic kidney disease, stage 1: Secondary | ICD-10-CM | POA: Insufficient documentation

## 2019-05-03 DIAGNOSIS — O2693 Pregnancy related conditions, unspecified, third trimester: Secondary | ICD-10-CM | POA: Insufficient documentation

## 2019-05-03 DIAGNOSIS — O99412 Diseases of the circulatory system complicating pregnancy, second trimester: Secondary | ICD-10-CM | POA: Diagnosis not present

## 2019-05-03 DIAGNOSIS — Z8679 Personal history of other diseases of the circulatory system: Secondary | ICD-10-CM

## 2019-05-03 DIAGNOSIS — O99891 Other specified diseases and conditions complicating pregnancy: Secondary | ICD-10-CM | POA: Diagnosis not present

## 2019-05-03 DIAGNOSIS — I471 Supraventricular tachycardia: Secondary | ICD-10-CM

## 2019-05-03 DIAGNOSIS — Z3A26 26 weeks gestation of pregnancy: Secondary | ICD-10-CM

## 2019-05-03 DIAGNOSIS — Z3A27 27 weeks gestation of pregnancy: Secondary | ICD-10-CM

## 2019-05-03 LAB — BASIC METABOLIC PANEL
Anion gap: 10 (ref 5–15)
BUN: 9 mg/dL (ref 6–20)
CO2: 20 mmol/L — ABNORMAL LOW (ref 22–32)
Calcium: 8.7 mg/dL — ABNORMAL LOW (ref 8.9–10.3)
Chloride: 109 mmol/L (ref 98–111)
Creatinine, Ser: 0.84 mg/dL (ref 0.44–1.00)
GFR calc Af Amer: >60 mL/min (ref >60)
GFR calc non Af Amer: >60 mL/min (ref >60)
Glucose, Bld: 135 mg/dL — ABNORMAL HIGH (ref 70–99)
Potassium: 3.8 mmol/L (ref 3.5–5.1)
Sodium: 139 mmol/L (ref 135–145)

## 2019-05-03 LAB — CBC
HCT: 39.6 % (ref 36.0–46.0)
Hemoglobin: 13.2 g/dL (ref 12.0–15.0)
MCH: 31.3 pg (ref 26.0–34.0)
MCHC: 33.3 g/dL (ref 30.0–36.0)
MCV: 93.8 fL (ref 80.0–100.0)
Platelets: 249 10*3/uL (ref 150–400)
RBC: 4.22 MIL/uL (ref 3.87–5.11)
RDW: 13.4 % (ref 11.5–15.5)
WBC: 10.8 10*3/uL — ABNORMAL HIGH (ref 4.0–10.5)
nRBC: 0 % (ref 0.0–0.2)

## 2019-05-03 LAB — T4, FREE: Free T4: 0.69 ng/dL (ref 0.61–1.12)

## 2019-05-03 LAB — TSH: TSH: 33.625 u[IU]/mL — ABNORMAL HIGH (ref 0.350–4.500)

## 2019-05-03 MED ORDER — ADENOSINE 6 MG/2ML IV SOLN
6.0000 mg | Freq: Once | INTRAVENOUS | Status: AC
  Administered 2019-05-03: 04:00:00 6 mg via INTRAVENOUS
  Filled 2019-05-03: qty 2

## 2019-05-03 MED ORDER — LACTATED RINGERS IV BOLUS
1000.0000 mL | Freq: Once | INTRAVENOUS | Status: AC
  Administered 2019-05-03: 1000 mL via INTRAVENOUS

## 2019-05-03 NOTE — ED Notes (Addendum)
Rapid response OB RN at bedside , placed on a fetal monitor , patient placed on a cardiac monitor . EDP evaluated patient , explained plan  of care to patient and family . Zoll pads applied to patient .

## 2019-05-03 NOTE — ED Triage Notes (Signed)
Patient transferred from MAU , patient reports palpitations with mild SOB this evening , denies chest pain , no emesis or diaphoresis , she is [redacted] weeks pregnant G1P0 , no abdominal contractions or vaginal bleeding .

## 2019-05-03 NOTE — MAU Note (Signed)
Pt transferred to the ED on wheelchair. Report given to Rose Fillers, RN.

## 2019-05-03 NOTE — Progress Notes (Signed)
0319: OBRRN called to [facility] for pt tachycardic at 199 in MAU, transferred to ED for evaluation and needs monitoring. 26. [redacted] wks GA.   0330: OBRRN at bedside. Pt placed on monitor.  0351: Dr. Royston Sinner called and notified of pt  tachycardic at 199 in MAU, transferred to ED for evaluation. FHR 150 with minimal variability, one variable noted. No ctx noted. Preparing pt for vagal maneuvers, and potential Adenosine administration if unsuccessful. Orders to get reactive NST post cardiac stabilization and OB clear.   0354: Pt removed from monitor for vagal maneuvers.   0405: FHR doppler: 160  0414: Adenosine 6mg  given per ED RN.   [phone]: Monitors applied.   0442: FHR 150, moderate variability, 15x15, no decels. One ctx noted and UI, palpated mild. Pt rates pain 0/10. VSS. OB cleared at this time.

## 2019-05-03 NOTE — MAU Provider Note (Signed)
First Provider Initiated Contact with Patient 05/03/19 [phone]      S Ms. Kree Neujahr is a 34 y.o. G1P0 at 26.4 weeks who presents to MAU today with complaint of tachycardia since 1am.  She has a history of thyroid cancer and is currently taking synthroid daily.  She denies pregnancy related complaints including abdominal pain or contractions as well as vaginal concerns, while endorsing fetal movement. Patient endorses some SOB that she describes as an inability to catch her breath. Patient denies chest pain, visual disturbances, or NV. Husband at bedside.     O BP 94/60 (BP Location: Right Arm)   Pulse (!) 199   Temp 97.6 F (36.4 C)   Resp 20   LMP 10/29/2018   SpO2 98%  Physical Exam  Vitals reviewed. Constitutional: She is oriented to person, place, and time. She appears well-developed and well-nourished. No distress.  HENT:  Head: Normocephalic and atraumatic.  Eyes: Conjunctivae are normal.  Cardiovascular: Regular rhythm and normal heart sounds. Tachycardia present.  Respiratory: Effort normal and breath sounds normal. No respiratory distress.  GI: Soft. There is no abdominal tenderness.  Musculoskeletal:     Cervical back: Normal range of motion.  Neurological: She is alert and oriented to person, place, and time.  Skin: Skin is warm and dry.  Psychiatric: She has a normal mood and affect. Her behavior is normal.   FHTs 150 by doppler  A 34 year old  G1P0 at 26.4 weeks Tachycardia with H/O SVT  P: -Patient informed of need to be transferred to [facility] for further evaluation. -No questions or concerns from patient or husband.  -MD in [facility] contacted and informed of patient complaint and status.  Accepts transfer. Requests that MAU nurse calls charge nurse. -RN to transport patient immediately to trauma room for evaluation.  -Patient may return to MAU as needed for pregnancy related complaints  Gavin Pound, CNM 05/03/2019 3:07 AM

## 2019-05-03 NOTE — ED Provider Notes (Signed)
Emergency Department Provider Note  I have reviewed the triage vital signs and the nursing notes.  HISTORY  Chief Complaint Tachycardia   HPI Lauren Morton is a 34 y.o. female who presents to the emergency department today secondary to repeat episode of SVT.  Patient states that she was here few weeks ago with something similar and would see but denies anything about it.  Earlier today she noticed her heart rate was high and she is feeling short of breath and lightheaded again so she presented to the MAU for evaluation and was transferred here for further evaluation.  Patient has a history of thyroidectomy secondary to Hashimoto's and has had some minor levothyroxine adjustments recently.    No other associated or modifying symptoms.    Past Medical History:  Diagnosis Date  . CKD (chronic kidney disease) stage 1, GFR 90 ml/min or greater 09/2013   mild proteinuria [facility]) sees yearly  . Hashimoto's thyroiditis    confirmed by biopsy s/p thyroidectomy  . Hypothyroidism, acquired, autoimmune   . Papillary thyroid carcinoma ([place]) 03/31/2014   s/p thyroidectomy, 10 negative LN    Patient Active Problem List   Diagnosis Date Noted  . Postsurgical hypothyroidism 08/18/2014  . Health maintenance examination 03/31/2014  . Papillary thyroid carcinoma ([place]) 03/31/2014  . CKD (chronic kidney disease) stage 1, GFR 90 ml/min or greater 09/01/2013  . History of Hashimoto thyroiditis 03/24/2013    Past Surgical History:  Procedure Laterality Date  . LYMPH NODE DISSECTION N/A 06/03/2014   Procedure: LIMITED LYMPH NODE DISSECTION;  Surgeon: Armandina Gemma, MD;  Location: [facility];  Service: General;  Laterality: N/A;  . THYROIDECTOMY N/A 06/03/2014   total - 1.5cm papillary thyroid carcinoma with negative surgical margins and 10/10 LN neg for mets; Armandina Gemma, MD  . TOTAL THYROIDECTOMY  06/2014   with limited central lymph node dissection ([place])  . WISDOM TOOTH EXTRACTION     4     Current Outpatient Rx  . Order #: [id number] Class: Normal    Allergies Patient has no known allergies.  Family History  Problem Relation Age of Onset  . Cancer Mother 41       breast cancer, s/p mastectomy  . Diabetes Father   . Coronary artery disease Maternal Grandfather        several MIs  . Cancer Maternal Grandmother        breast cancer s/p lumpectomy  . Thyroid disease Maternal Grandmother   . Alcohol abuse Paternal Grandmother   . Stroke Neg Hx     Social History Social History   Tobacco Use  . Smoking status: Never Smoker  . Smokeless tobacco: Never Used  Substance Use Topics  . Alcohol use: Not Currently  . Drug use: No    Review of Systems  All other systems negative except as documented in the HPI. All pertinent positives and negatives as reviewed in the HPI. ____________________________________________  PHYSICAL EXAM:  VITAL SIGNS: ED Triage Vitals  Enc Vitals Group     BP 05/03/19 0301 94/60     Pulse Rate 05/03/19 0301 (!) 199     Resp 05/03/19 0301 20     Temp 05/03/19 0301 97.6 F (36.4 C)     Temp src --      SpO2 05/03/19 0301 98 %     Weight --      Height --      Head Circumference --      Peak Flow --  Pain Score 05/03/19 0322 0     Pain Loc --      Pain Edu? --      Excl. in [place]? --     Constitutional: Alert and oriented. Well appearing and in no acute distress. Eyes: Conjunctivae are normal. PERRL. EOMI. Head: Atraumatic. Nose: No congestion/rhinnorhea. Mouth/Throat: Mucous membranes are moist.  Oropharynx non-erythematous. Neck: No stridor.  No meningeal signs.   Cardiovascular: Normal rate, regular rhythm. Good peripheral circulation. Grossly normal heart sounds.   Respiratory: Normal respiratory effort.  No retractions. Lungs CTAB. Gastrointestinal: Soft and nontender. No distention.  Musculoskeletal: No lower extremity tenderness nor edema. No gross deformities of extremities. Neurologic:  Normal speech and  language. No gross focal neurologic deficits are appreciated.  Skin:  Skin is warm, dry and intact. No rash noted.  ____________________________________________   LABS (all labs ordered are listed, but only abnormal results are displayed)  Labs Reviewed  TSH - Abnormal; Notable for the following components:      Result Value   TSH 33.625 (*)    All other components within normal limits  CBC - Abnormal; Notable for the following components:   WBC 10.8 (*)    All other components within normal limits  BASIC METABOLIC PANEL - Abnormal; Notable for the following components:   CO2 20 (*)    Glucose, Bld 135 (*)    Calcium 8.7 (*)    All other components within normal limits  T4, FREE   ____________________________________________  EKG   EKG Interpretation  Date/Time:    Ventricular Rate:    PR Interval:    QRS Duration:   QT Interval:    QTC Calculation:   R Axis:     Text Interpretation:         ____________________________________________  RADIOLOGY  No results found. ____________________________________________  PROCEDURES  Procedure(s) performed:   .Critical Care Performed by: Merrily Pew, MD Authorized by: Merrily Pew, MD   Critical care provider statement:    Critical care time (minutes):  45   Critical care was necessary to treat or prevent imminent or life-threatening deterioration of the following conditions:  Cardiac failure   Critical care was time spent personally by me on the following activities:  Discussions with consultants, evaluation of patient's response to treatment, examination of patient, ordering and performing treatments and interventions, ordering and review of laboratory studies, ordering and review of radiographic studies, pulse oximetry, re-evaluation of patient's condition, obtaining history from patient or surrogate and review of old charts  ____________________________________________  INITIAL IMPRESSION / [place] / ED COURSE   This patient presents to the ED for concern of palpitations, this involves an extensive number of treatment options, and is a complaint that carries with it a high risk of complications and morbidity.  The differential diagnosis includes vtach, svt, sinus tachcyardia, hypovolemia  Lab Tests:   I Ordered, reviewed, and interpreted labs, which included TSH, CMP, CBC, T4  Medicines ordered:   I ordered medication adenosine  For SVT   Imaging Studies ordered:  Not indicated  Additional history obtained:   Additional history obtained from charts, husband  Previous records obtained and reviewed charts and husband  Consultations Obtained:   I consulted no one  Reevaluation:  After the interventions stated above, I reevaluated the patient and found that the patient persistently stayed in a sinus rhythm under 100 for nearly 3 hours.  Patient will be referred to cardiology for follow-up appointment especially with her pregnancy and  this being the second episode in 5 weeks. Ob rapid response performed monitoring of fetus before and after adenosine without reported issues.   Critical Interventions: Adenosine for life-threatening arrhythmia..  A medical screening exam was performed and I feel the patient has had an appropriate workup for their chief complaint at this time and likelihood of emergent condition existing is low. They have been counseled on decision, discharge, follow up and which symptoms necessitate immediate return to the emergency department. They or their family verbally stated understanding and agreement with plan and discharged in stable condition.   ____________________________________________  FINAL CLINICAL IMPRESSION(S) / ED DIAGNOSES  Final diagnoses:  Tachycardia  [redacted] weeks gestation of pregnancy  SVT (supraventricular tachycardia) ([facility])    MEDICATIONS GIVEN DURING THIS VISIT:  Medications  lactated ringers bolus 1,000 mL (0 mLs  Intravenous Stopped 05/03/19 0424)  adenosine (ADENOCARD) 6 MG/2ML injection 6 mg (6 mg Intravenous Given 05/03/19 0419)    NEW OUTPATIENT MEDICATIONS STARTED DURING THIS VISIT:  Discharge Medication List as of 05/03/2019  6:44 AM      Note:  This note was prepared with assistance of Dragon voice recognition software. Occasional wrong-word or sound-a-like substitutions may have occurred due to the inherent limitations of voice recognition software.   Bobbyjoe Pabst, Corene Cornea, MD 05/04/19 [phone]

## 2019-05-03 NOTE — MAU Note (Signed)
..  Lauren Morton is a 34 y.o. at [redacted]w[redacted]d here in MAU reporting: fast heart rate. Pt denies any vaginal bleeding. Leaking of fluid. +FM.   Onset of complaint: 0130 this morning Pain score: 0/10 Vitals:   05/03/19 0301  BP: 94/60  Pulse: (!) 199  Resp: 20  Temp: 97.6 F (36.4 C)  SpO2: 98%     FHT: 150 doppler

## 2019-05-04 ENCOUNTER — Other Ambulatory Visit (INDEPENDENT_AMBULATORY_CARE_PROVIDER_SITE_OTHER): Payer: BC Managed Care – PPO

## 2019-05-04 DIAGNOSIS — E890 Postprocedural hypothyroidism: Secondary | ICD-10-CM | POA: Diagnosis not present

## 2019-05-05 ENCOUNTER — Other Ambulatory Visit: Payer: Self-pay | Admitting: Internal Medicine

## 2019-05-05 LAB — TSH: TSH: 15.91 u[IU]/mL — ABNORMAL HIGH (ref 0.35–4.50)

## 2019-05-05 LAB — T4, FREE: Free T4: 0.75 ng/dL (ref 0.60–1.60)

## 2019-05-12 DIAGNOSIS — Z3480 Encounter for supervision of other normal pregnancy, unspecified trimester: Secondary | ICD-10-CM | POA: Diagnosis not present

## 2019-05-17 NOTE — Progress Notes (Signed)
Cardiology Office Note:   Date:  05/18/2019  NAME:  Lauren Morton    MRN: [id number] DOB:  11-29-1985   PCP:  Ria Bush, MD  Cardiologist:  No primary care provider on file.   Referring MD: Merrily Pew, MD   Chief Complaint  Patient presents with  . Tachycardia   History of Present Illness:   [name] is a 34 y.o. female with a hx of SVT, hypothyroidism who is being seen today for the evaluation of AVNRT at the request of Mesner, Corene Cornea, MD.  She reports a longstanding history of SVT.  She is apparently had these back in high school.  She reports prior to becoming pregnant she would have 1 episode per year.  They would last 3 to 5 minutes and resolve with deep breathing.  She reports she is had 2 episodes while pregnant.  She was evaluated the emergency room in March and given adenosine.  She had another episode in May and was also given adenosine.  She is also going through issues with her thyroid.  She does have a history of hypothyroidism taking Synthroid.  Her most recent TSH is 15.9.  This is being treated by her endocrinologist.  This is her first pregnancy.  She does desire subsequent pregnancies.  She reports no major issues on a daily basis.  She denies any chest pain or shortness of breath with activity.  Things do get her out of breath such as cleaning the house which is expected in pregnancy.  She has no more than expected lower extremity edema.  She reports when she gets the episodes she has a sensation of rapid heartbeat with heart rate in the 180s.  She reports she feels her chest tight.  Symptoms resolved after receiving adenosine.  I did review her EKGs which show atypical AVNRT.  I did discuss with her that there are no good medication options at this time.  She is currently [redacted] weeks pregnant and I think the best course of action would just be to return to the emergency room should she have further symptoms and receive adenosine.  An ablation is the  question right now this would involve radiation.  I will discuss her case with EP as they may consider floor less ablation but I suspect they will choose a more conservative approach until she delivers.  She is a never smoker.  Does not consume alcohol.  No illicit drug use.  There is no strong family history of heart disease.  She does report her paternal grandfather had heart disease but she herself has never had any issues.  Problem List 1. 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 Hypothyroidism    Past Medical History: Past Medical History:  Diagnosis Date  . CKD (chronic kidney disease) stage 1, GFR 90 ml/min or greater 09/2013   mild proteinuria [facility]) sees yearly  . Hashimoto's thyroiditis    confirmed by biopsy s/p thyroidectomy  . Hypothyroidism, acquired, autoimmune   . Papillary thyroid carcinoma ([place]) 03/31/2014   s/p thyroidectomy, 10 negative LN    Past Surgical History: Past Surgical History:  Procedure Laterality Date  . LYMPH NODE DISSECTION N/A 06/03/2014   Procedure: LIMITED LYMPH NODE DISSECTION;  Surgeon: Armandina Gemma, MD;  Location: [facility];  Service: General;  Laterality: N/A;  . THYROIDECTOMY N/A 06/03/2014   total - 1.5cm papillary thyroid carcinoma with negative surgical margins and 10/10 LN neg for mets; Armandina Gemma, MD  . TOTAL THYROIDECTOMY  06/2014   with limited central lymph node dissection ([place])  . WISDOM TOOTH EXTRACTION     4    Current Medications: Current Meds  Medication Sig  . levothyroxine (SYNTHROID) 150 MCG tablet Take 1 tablet (150 mcg total) by mouth daily.     Allergies:    Patient has no known allergies.   Social History: Social History   Socioeconomic History  . Marital status: Married    Spouse name: Not on file  . Number of children: 1  . Years of education: Not on file  . Highest education level: Not on file  Occupational History  . Not on file  Tobacco Use  . Smoking status: Never Smoker  .  Smokeless tobacco: Never Used  Substance and Sexual Activity  . Alcohol use: Not Currently  . Drug use: No  . Sexual activity: Not on file  Other Topics Concern  . Not on file  Social History Narrative   Caffeine: 1 soda at lunch   Lives with husband, 2 dogs and 1 pig, chickens   Occupation: school counselor   Edu: Masters in education   Activity: runs, has done 1/2 marathons   Diet: good water, good fruits/vegetables, red meat 2x/wk, fish 1x/wk   Social Determinants of Radio broadcast assistant Strain:   . Difficulty of Paying Living Expenses:   Food Insecurity:   . Worried About Charity fundraiser in the Last Year:   . Arboriculturist in the Last Year:   Transportation Needs:   . Film/video editor (Medical):   Marland Kitchen Lack of Transportation (Non-Medical):   Physical Activity:   . Days of Exercise per Week:   . Minutes of Exercise per Session:   Stress:   . Feeling of Stress :   Social Connections:   . Frequency of Communication with Friends and Family:   . Frequency of Social Gatherings with Friends and Family:   . Attends Religious Services:   . Active Member of Clubs or Organizations:   . Attends Archivist Meetings:   Marland Kitchen Marital Status:      Family History: The patient's family history includes Alcohol abuse in her paternal grandmother; Cancer in her maternal grandmother; Cancer (age of onset: 65) in her mother; Coronary artery disease in her maternal grandfather; Diabetes in her father; Heart disease in her maternal grandfather; Heart failure in her maternal grandfather; Thyroid disease in her maternal grandmother. There is no history of Stroke.  ROS:   All other ROS reviewed and negative. Pertinent positives noted in the HPI.     EKGs/Labs/Other Studies Reviewed:   The following studies were personally reviewed by me today:  EKG:  EKG is ordered today.  The ekg ordered today demonstrates normal sinus rhythm heart rate 85, no acute ST-T changes, no  evidence of prior infarction, and was personally reviewed by me.   Recent Labs: 03/28/2019: ALT 30; B Natriuretic Peptide 516.1; Magnesium 1.6 05/03/2019: BUN 9; Creatinine, Ser 0.84; Hemoglobin 13.2; Platelets 249; Potassium 3.8; Sodium 139 05/04/2019: TSH 15.91   Recent Lipid Panel    Component Value Date/Time   CHOL 188 03/31/2014 1227   TRIG 70.0 03/31/2014 1227   HDL 69.90 03/31/2014 1227   CHOLHDL 3 03/31/2014 1227   VLDL 14.0 03/31/2014 1227   LDLCALC 104 (H) 03/31/2014 1227    Physical Exam:   VS:  BP 115/75   Pulse 85   Temp (!) 96.3 F (35.7 C)   Ht  5\' 11"  (1.803 m)   Wt 191 lb 12.8 oz (87 kg)   LMP 10/29/2018   SpO2 98%   BMI 26.75 kg/m    Wt Readings from Last 3 Encounters:  05/18/19 191 lb 12.8 oz (87 kg)  03/28/19 180 lb (81.6 kg)  12/02/18 164 lb (74.4 kg)    General: Well nourished, well developed, in no acute distress Heart: Atraumatic, normal size  Eyes: PEERLA, EOMI  Neck: Supple, no JVD Endocrine: No thryomegaly Cardiac: Normal S1, S2; RRR; no murmurs, rubs, or gallops Lungs: Clear to auscultation bilaterally, no wheezing, rhonchi or rales  Abd: Soft, nontender, no hepatomegaly  Ext: No edema, pulses 2+ Musculoskeletal: No deformities, BUE and BLE strength normal and equal Skin: Warm and dry, no rashes   Neuro: Alert and oriented to person, place, time, and situation, CNII-XII grossly intact, no focal deficits  Psych: Normal mood and affect   ASSESSMENT:   Hadlea Accomando is a 34 y.o. female who presents for the following: 1. AVNRT (AV nodal re-entry tachycardia) ([place])     PLAN:   1. AVNRT (AV nodal re-entry tachycardia) ([place]) -EKG consistent with typical AVNRT.  Likely triggered by pregnancy as well as hypothyroidism.  I would like for her to get her hypothyroid state addressed.  She is seeing endocrinology.  This would be a simple cardiac ablation to cure this arrhythmia.  However, she is [redacted] weeks pregnant.  At this time I recommend no  medication.  I did counsel her on vagal maneuvers such as bearing down as well as carotid neck massage.  If she is unable to break her arrhythmia she will need to return to the emergency room for adenosine.  Adenosine is safe in pregnancy.  She is in agreement with this plan.  Another option would be flecainide however she is close to delivery and I think we can just see how she does.  I suspect her symptoms and arrhythmia also improved with treatment of her hypothyroidism.  We will plan to see her back in 6 months after her delivery.  This will occur in August.  We will obtain an echocardiogram in the interim.  Her EKG today is normal.  She has no alarming features on examination.  There is no evidence of cardiovascular disease and no murmurs rubs or gallops.  Disposition: Return in about 6 months (around 11/18/2019).  Medication Adjustments/Labs and Tests Ordered: Current medicines are reviewed at length with the patient today.  Concerns regarding medicines are outlined above.  Orders Placed This Encounter  Procedures  . EKG 12-Lead   No orders of the defined types were placed in this encounter.   Patient Instructions  Medication Instructions:  The current medical regimen is effective;  continue present plan and medications.  *If you need a refill on your cardiac medications before your next appointment, please call your pharmacy*   Follow-Up: At [facility], you and your health needs are our priority.  As part of our continuing mission to provide you with exceptional heart care, we have created designated Provider Care Teams.  These Care Teams include your primary Cardiologist (physician) and Advanced Practice Providers (APPs -  Physician Assistants and Nurse Practitioners) who all work together to provide you with the care you need, when you need it.  We recommend signing up for the patient portal called "MyChart".  Sign up information is provided on this After Visit Summary.  MyChart  is used to connect with patients for Virtual Visits (Telemedicine).  Patients are able to view lab/test results, encounter notes, upcoming appointments, etc.  Non-urgent messages can be sent to your provider as well.   To learn more about what you can do with MyChart, go to [email or web address].    Your next appointment:   6 month(s)  The format for your next appointment:   In Person  Provider:   Eleonore Chiquito, MD         Signed, Addison Naegeli. Audie Box, [place]  [address], [place], [place] [address] [phone]  05/18/2019 9:49 AM

## 2019-05-18 ENCOUNTER — Encounter: Payer: Self-pay | Admitting: Cardiovascular Disease

## 2019-05-18 ENCOUNTER — Ambulatory Visit: Payer: BC Managed Care – PPO | Admitting: Cardiovascular Disease

## 2019-05-18 ENCOUNTER — Other Ambulatory Visit: Payer: Self-pay

## 2019-05-18 VITALS — BP 115/75 | HR 85 | Temp 96.3°F | Ht 71.0 in | Wt 191.8 lb

## 2019-05-18 DIAGNOSIS — I471 Supraventricular tachycardia: Secondary | ICD-10-CM

## 2019-05-18 NOTE — Patient Instructions (Signed)
Medication Instructions:  The current medical regimen is effective;  continue present plan and medications.  *If you need a refill on your cardiac medications before your next appointment, please call your pharmacy*   Follow-Up: At [facility], you and your health needs are our priority.  As part of our continuing mission to provide you with exceptional heart care, we have created designated Provider Care Teams.  These Care Teams include your primary Cardiologist (physician) and Advanced Practice Providers (APPs -  Physician Assistants and Nurse Practitioners) who all work together to provide you with the care you need, when you need it.  We recommend signing up for the patient portal called "MyChart".  Sign up information is provided on this After Visit Summary.  MyChart is used to connect with patients for Virtual Visits (Telemedicine).  Patients are able to view lab/test results, encounter notes, upcoming appointments, etc.  Non-urgent messages can be sent to your provider as well.   To learn more about what you can do with MyChart, go to [email or web address].    Your next appointment:   6 month(s)  The format for your next appointment:   In Person  Provider:   [place] O'Neal, MD      

## 2019-05-26 DIAGNOSIS — O09523 Supervision of elderly multigravida, third trimester: Secondary | ICD-10-CM | POA: Diagnosis not present

## 2019-05-26 DIAGNOSIS — Z3A29 29 weeks gestation of pregnancy: Secondary | ICD-10-CM | POA: Diagnosis not present

## 2019-05-28 ENCOUNTER — Other Ambulatory Visit: Payer: Self-pay | Admitting: Internal Medicine

## 2019-06-04 ENCOUNTER — Telehealth: Payer: Self-pay | Admitting: Internal Medicine

## 2019-06-04 ENCOUNTER — Other Ambulatory Visit: Payer: Self-pay

## 2019-06-04 NOTE — Telephone Encounter (Signed)
When I called patient to screen for upcoming appointment on 06/08/19 patient requested Pre-Appointment Labs.  Please advise patient at [phone]

## 2019-06-04 NOTE — Telephone Encounter (Signed)
Please advise, she did have labs 05/04/2019.

## 2019-06-04 NOTE — Telephone Encounter (Signed)
M, OK if she insists.  Let us order a TSH and free T4.  I will take the liberty to order more labs at the time of the visit, if needed.

## 2019-06-08 ENCOUNTER — Ambulatory Visit: Payer: BC Managed Care – PPO | Admitting: Internal Medicine

## 2019-06-08 ENCOUNTER — Encounter: Payer: Self-pay | Admitting: Internal Medicine

## 2019-06-08 ENCOUNTER — Other Ambulatory Visit: Payer: Self-pay

## 2019-06-08 VITALS — BP 110/70 | HR 94 | Ht 71.0 in | Wt 194.0 lb

## 2019-06-08 DIAGNOSIS — C73 Malignant neoplasm of thyroid gland: Secondary | ICD-10-CM

## 2019-06-08 DIAGNOSIS — E890 Postprocedural hypothyroidism: Secondary | ICD-10-CM | POA: Diagnosis not present

## 2019-06-08 NOTE — Progress Notes (Signed)
Patient ID: Lauren Morton, female   DOB: 1985-03-19, 34 y.o.   MRN: [id number]  This visit occurred during the SARS-CoV-2 public health emergency.  Safety protocols were in place, including screening questions prior to the visit, additional usage of staff PPE, and extensive cleaning of exam room while observing appropriate contact time as indicated for disinfecting solutions.   HPI  Lauren Morton is a 34 y.o.-year-old female, returning for f/u for papillary thyroid cancer and postsurgical hypothyroidism. Last visit 6 months ago.  She is pregnant, 31 weeks and 2 days along.  Her due date is 08/05/2019.  Since last visit, she had tachycardia and SVT and was seen in the emergency room on 03/28/2019 and 05/03/2019. She had adenosine. She saw cardiology since then. She may have ablation after the pregnancy.  During her 2 ED trips, her TFTs were checked and her TSH returned very elevated.  I was not aware of the results until she had a new set of labs on 05/04/2019 and her TSH was still elevated, only slightly improved from before.  Her levothyroxine dose was increased and to 175 mcg daily.  Upon questioning, she missed levothyroxine doses around 03-05/2019 when she was remodeling her bathroom and she kept her levothyroxine tablets in the bathroom.  Reviewed and addended her thyroid cancer history: Pt. has been dx with goiter in ~2006>> thyroid U/S: ? Results but also dx'ed with Hashimoto's thyroiditis with hypothyroidism >> started levothyroxine.  She started to see Dr Danise Mina 03/2014 >> new U/S (04/2014): small R thyroid nodule 1.4 x 1.3 x 1.1 cm, with microcalcifications >>  PTC in 04/2014, by FNA.   - 06/03/2014: Total thyroidectomy -Dr. Harlow Asa. Path: Diagnosis 1. Thyroid, thyroidectomy, total thyroid, suture right superior pole - PAPILLARY THYROID CARCINOMA, 1.5 CM IN GREATEST DIMENSION. - MARGINS ARE NEGATIVE. - BACKGROUND FLORID LYMPHOCYTIC THYROIDITIS. - SEE ONCOLOGY TEMPLATE. 2.  Lymph nodes, regional resection, central compartment - TEN BENIGN LYMPH NODES WITH NO TUMOR SEEN (0/10). Microscopic Comment 1. THYROID Specimen: Thyroid with central compartment lymph nodes. Procedure: Total thyroidectomy with limited central compartment lymph dissection. Specimen Integrity (intact/fragmented): Intact. Tumor focality: Unifocal. Dominant tumor: Maximum tumor size (cm): 1.5 cm. Tumor laterality: Tumor involves right superior pole. Histologic type (including subtype and/or unique features as applicable): Papillary thyroid carcinoma, conventional type. Tumor capsule: Tumor is partially encapsulated. Extrathyroidal extension: No. Margins: Negative. Lymph - Vascular invasion: Definitive lymph/vascular is not identified. Capsular invasion with degree of invasion if present: No capsular invasion identified. Lymph nodes: # examined 10; # positive 0. TNM code: pT1b, pN0. Non-neoplastic thyroid: Flor[place] lymphocytic thyroiditis. (RH:ds 06/04/14)  Neck U/S (12/28/2018): Isthmus: Surgically absent. There is no residual nodular soft tissue within the isthmic resection bed.  Right lobe: Surgically absent. There is fairly well-defined hypoechoic nodular soft tissue measuring approximately 0.5 x 0.4 x 1.0 cm within the right thyroid lobectomy resection bed.  Left lobe: Surgically absent. There is no residual nodular soft tissue within the left lobectomy resection bed.  Benign appearing non pathologically enlarged cervical lymph nodes are seen bilaterally.  Neck U/S (01/03/2018): Redemonstration surgical changes of thyroidectomy. No residual   thyroid tissue identified within the left or right thyroid bed.  Her thyroglobulin levels remain detectable as she did not tolerate treatment-since her tumor was only 1.5 cm: Lab Results  Component Value Date   THYROGLB 0.3 (L) 06/05/2018   THYROGLB 0.2 (L) 12/12/2017   THYROGLB 0.4 (L) 03/01/2017   THYROGLB 0.3 (L) 01/25/2016    THYROGLB 0.8 (L)  02/18/2015   THYROGLB 0.8 (L) 08/18/2014   Lab Results  Component Value Date   THGAB <1 06/05/2018   THGAB <1 12/12/2017   THGAB <1 03/01/2017   THGAB <1 01/25/2016   THGAB <1 02/18/2015   THGAB 1 08/18/2014   12/2015: Neck U/S 1 year after thyroidectomy:  Post total thyroidectomy with minimal amount (approximately 1 cm) of residual nodular tissue within the right thyroid lobectomy resection bed potentially indicative of residual thyroid tissue though residual/locally recurrent disease is not excluded on the basis this examination. This residual tissue could be amenable to ultrasound-guided fine-needle aspiration as clinically indicated.  Postsurgical hypothyroidism:  In 12/2017, she was supposed to be on levothyroxine 125 mcg daily, however, the pharmacy refilled the previous prescription for 175 mcg daily and she was taking this for the months prior to our last visit.  Subsequently, TSH was suppressed.  We decreased the dose to 125 mcg daily and her TFTs normalized in 01/2018.  However, then, TSH was suppressed and we decreased the dose of her levothyroxine.  She did not return for repeat labs 1.5 months later... In 12/2018, her TSH was normal, but above target for pregnancy so we increased her levothyroxine dose to 9 tablets of 112 mcg/week.  However, since then, she had several TSH levels that were much higher, unexplained.  I was not aware of that was done, as she had them checked when she presented to the ED with tachycardia.  On 05/04/2019, TSH was still high at 15.9.  Pt is on levothyroxine 175 mcg daily for the last month, taken: - previously missed doses, not in the last month - in am - fasting - at least 30 min from b'fast, ~45 min after coffee and cream - no Ca, Fe, PPIs - + Prenatal vitamins in the evening - not on Biotin  Reviewed TFTs: Lab Results  Component Value Date   TSH 15.91 (H) 05/04/2019   TSH 33.625 (H) 05/03/2019   TSH 14.071 (H) 03/28/2019    TSH 0.19 (L) 03/02/2019   TSH 5.22 (H) 01/07/2019   TSH 3.72 12/02/2018   TSH 0.11 (L) 06/05/2018   TSH 0.40 01/15/2018   TSH 0.02 (L) 12/12/2017   TSH 0.05 (L) 03/01/2017   FREET4 0.75 05/04/2019   FREET4 0.69 05/03/2019   FREET4 0.66 03/28/2019   FREET4 1.53 03/02/2019   FREET4 0.93 01/07/2019   FREET4 1.19 12/02/2018   FREET4 1.36 06/05/2018   FREET4 1.26 01/15/2018   FREET4 1.54 12/12/2017   FREET4 0.93 03/01/2017  01/10/2017: TSH 0.03 at OB/GYN office -we decreased the dose of levothyroxine at that time.  Pt denies: - feeling nodules in neck - hoarseness - dysphagia - choking - SOB with lying down  She has + FH of thyroid disorders in: MGM - Hashimoto's ds. No FH of thyroid cancer. No h/o radiation tx to head or neck.  No herbal supplements. No Biotin use. No recent steroids use.   ROS: Constitutional: no weight gain/no weight loss, no fatigue, no subjective hyperthermia, no subjective hypothermia Eyes: no blurry vision, no xerophthalmia ENT: no sore throat, + see HPI Cardiovascular: no CP/no SOB/no palpitations/no leg swelling Respiratory: no cough/no SOB/no wheezing Gastrointestinal: no N/no V/no D/no C/no acid reflux Musculoskeletal: no muscle aches/no joint aches Skin: no rashes, no hair loss Neurological: no tremors/no numbness/no tingling/no dizziness  I reviewed pt's medications, allergies, PMH, social hx, family hx, and changes were documented in the history of present illness. Otherwise, unchanged from my initial visit  note.  Past Medical History:  Diagnosis Date  . CKD (chronic kidney disease) stage 1, GFR 90 ml/min or greater 09/2013   mild proteinuria [facility]) sees yearly  . Hashimoto's thyroiditis    confirmed by biopsy s/p thyroidectomy  . Hypothyroidism, acquired, autoimmune   . Papillary thyroid carcinoma ([place]) 03/31/2014   s/p thyroidectomy, 10 negative LN   Past Surgical History:  Procedure Laterality Date  . LYMPH NODE DISSECTION N/A  06/03/2014   Procedure: LIMITED LYMPH NODE DISSECTION;  Surgeon: Armandina Gemma, MD;  Location: [facility];  Service: General;  Laterality: N/A;  . THYROIDECTOMY N/A 06/03/2014   total - 1.5cm papillary thyroid carcinoma with negative surgical margins and 10/10 LN neg for mets; Armandina Gemma, MD  . TOTAL THYROIDECTOMY  06/2014   with limited central lymph node dissection ([place])  . WISDOM TOOTH EXTRACTION     4   Social History   Social History  . Marital Status: Married    Spouse Name: N/A  . Number of Children: 0   Occupational History  . School counselor   Social History Main Topics  . Smoking status: Never Smoker   . Smokeless tobacco: Never Used  . Alcohol Use: Yes     Comment: social use  . Drug Use: No   Social History Narrative   Caffeine: 1 soda at lunch   Lives with husband, 2 dogs and 1 pig, chickens   Occupation: Animal nutritionist   Edu: Masters in education   Activity: runs, has done 1/2 marathons   Diet: good water, good fruits/vegetables, red meat 2x/wk, fish 1x/wk   Current Outpatient Medications on File Prior to Visit  Medication Sig Dispense Refill  . levothyroxine (SYNTHROID) 150 MCG tablet TAKE 1 TABLET BY MOUTH EVERY DAY 90 tablet 1   No current facility-administered medications on file prior to visit.   No Known Allergies Family History  Problem Relation Age of Onset  . Cancer Mother 22       breast cancer, s/p mastectomy  . Diabetes Father   . Coronary artery disease Maternal Grandfather        several MIs  . Heart disease Maternal Grandfather   . Heart failure Maternal Grandfather   . Cancer Maternal Grandmother        breast cancer s/p lumpectomy  . Thyroid disease Maternal Grandmother   . Alcohol abuse Paternal Grandmother   . Stroke Neg Hx    PE: BP 110/70   Pulse 94   Ht _0  (1.803 m)   Wt 194 lb (88 kg)   LMP 10/29/2018   SpO2 97%   BMI 27.06 kg/m  Body mass index is 27.06 kg/m. Wt Readings from Last 3 Encounters:  06/08/19 194 lb  (88 kg)  05/18/19 191 lb 12.8 oz (87 kg)  03/28/19 180 lb (81.6 kg)   Constitutional: Pregnant appearing, in NAD Eyes: PERRLA, EOMI, no exophthalmos ENT: moist mucous membranes, no neck masses palpated, no cervical lymphadenopathy Cardiovascular: RRR, No MRG Respiratory: CTA B Gastrointestinal: abdomen soft, NT, ND, BS+ Musculoskeletal: no deformities, strength intact in all 4 Skin: moist, warm, no rashes Neurological: no tremor with outstretched hands, DTR normal in all 4   ASSESSMENT: 1.  Papillary thyroid cancer (PTC) - see HPI  2. Postsurgical Hypothyroidism  PLAN:  1. Thyroid cancer - papillary -Patient with a 1.5 cm PTC cancer focus, for which she had total thyroidectomy but no RAI treatment due to size and low risk status.  We continue to follow  her with thyroglobulin levels and neck ultrasounds.  Reviewed the report of her neck ultrasound from 12/2015.  This showed a 1 cm right neck mass in the thyroid region.  However, she had another neck ultrasound in 01/2018 and this did not show any masses in her neck.  It is likely that the previous seen 1 cm mass was just in the area of inflammation. -We reviewed latest thyroglobulin and ATA antibodies: Her thyroglobulin level was detectable (expected as she did not have RAI treatment) but lower than before and it continues to decrease.  ATA antibodies were undetectable. -We will repeat these after the pregnancy  2. Patient with history of total thyroidectomy, on levothyroxine therapy, with uncontrolled hypothyroidism, now pregnant - latest thyroid labs reviewed with pt >> remains elevated despite the significant increase in her levothyroxine dose.  Retrospectively, patient missed levothyroxine doses.  We discussed the it is critical for the pregnancy not to miss doses.  Discussed about possible deleterious effects of uncontrolled hypothyroidism on the baby and the pregnancy itself.  She did not miss doses in the last month. -Review latest  TSH: Lab Results  Component Value Date   TSH 15.91 (H) 05/04/2019   - she continues on LT4 175 mcg daily (the last month) - pt feels good on this dose. - we discussed about taking the thyroid hormone every day, with water, >30 minutes before breakfast, separated by >4 hours from acid reflux medications, calcium, iron, multivitamins. Pt. is taking it correctly. - will check thyroid tests today: TSH and fT4 - If labs are abnormal, she will need to return for repeat TFTs in 1.5 months - I we will see her back in 3 months, after her pregnancy, but will need to repeat labs every 4 weeks during pregnancy.  Component     Latest Ref Rng & Units 06/08/2019  TSH     0.35 - 4.50 uIU/mL 4.93 (H)  T4,Free(Direct)     0.60 - 1.60 ng/dL 0.85  TSH is now only slightly high, but I suspect that this is due to missed levothyroxine doses.  We discussed about the importance of not missing doses at last visit and I would like to repeat her tests in 4 weeks before changing her dose.  Philemon Kingdom, MD PhD [facility] Endocrinology

## 2019-06-08 NOTE — Patient Instructions (Addendum)
Please continue levothyroxine 175 mcg daily.  Take the thyroid hormone every day, with water, at least 30 minutes before breakfast, separated by at least 4 hours from: - acid reflux medications - calcium - iron - multivitamins  Please stop at the lab.  Target TSH for pregnancy: - first trimester: <2.5 - afterwards: <3  Please return in 3 months.

## 2019-06-09 ENCOUNTER — Encounter: Payer: Self-pay | Admitting: Internal Medicine

## 2019-06-09 LAB — T4, FREE: Free T4: 0.85 ng/dL (ref 0.60–1.60)

## 2019-06-09 LAB — TSH: TSH: 4.93 u[IU]/mL — ABNORMAL HIGH (ref 0.35–4.50)

## 2019-06-17 DIAGNOSIS — E039 Hypothyroidism, unspecified: Secondary | ICD-10-CM | POA: Diagnosis not present

## 2019-06-17 DIAGNOSIS — N181 Chronic kidney disease, stage 1: Secondary | ICD-10-CM | POA: Diagnosis not present

## 2019-06-17 DIAGNOSIS — R809 Proteinuria, unspecified: Secondary | ICD-10-CM | POA: Diagnosis not present

## 2019-07-07 DIAGNOSIS — Z3685 Encounter for antenatal screening for Streptococcus B: Secondary | ICD-10-CM | POA: Diagnosis not present

## 2019-07-07 DIAGNOSIS — O26849 Uterine size-date discrepancy, unspecified trimester: Secondary | ICD-10-CM | POA: Diagnosis not present

## 2019-07-07 DIAGNOSIS — Z3480 Encounter for supervision of other normal pregnancy, unspecified trimester: Secondary | ICD-10-CM | POA: Diagnosis not present

## 2019-07-07 DIAGNOSIS — Z3A35 35 weeks gestation of pregnancy: Secondary | ICD-10-CM | POA: Diagnosis not present

## 2019-07-08 ENCOUNTER — Other Ambulatory Visit: Payer: Self-pay

## 2019-07-08 ENCOUNTER — Other Ambulatory Visit (INDEPENDENT_AMBULATORY_CARE_PROVIDER_SITE_OTHER): Payer: BC Managed Care – PPO

## 2019-07-08 DIAGNOSIS — E890 Postprocedural hypothyroidism: Secondary | ICD-10-CM

## 2019-07-08 LAB — TSH: TSH: 8.64 u[IU]/mL — ABNORMAL HIGH (ref 0.35–4.50)

## 2019-07-08 LAB — T4, FREE: Free T4: 0.81 ng/dL (ref 0.60–1.60)

## 2019-07-09 ENCOUNTER — Other Ambulatory Visit: Payer: Self-pay | Admitting: Internal Medicine

## 2019-07-09 DIAGNOSIS — E890 Postprocedural hypothyroidism: Secondary | ICD-10-CM

## 2019-07-09 MED ORDER — LEVOTHYROXINE SODIUM 200 MCG PO TABS: 200.0000 ug | ORAL_TABLET | Freq: Every day | ORAL | 3 refills | Status: DC

## 2019-07-10 DIAGNOSIS — O4103X0 Oligohydramnios, third trimester, not applicable or unspecified: Secondary | ICD-10-CM | POA: Diagnosis not present

## 2019-07-10 DIAGNOSIS — Z3A36 36 weeks gestation of pregnancy: Secondary | ICD-10-CM | POA: Diagnosis not present

## 2019-07-10 DIAGNOSIS — O09513 Supervision of elderly primigravida, third trimester: Secondary | ICD-10-CM | POA: Diagnosis not present

## 2019-07-11 ENCOUNTER — Inpatient Hospital Stay (HOSPITAL_COMMUNITY)
Admission: AD | Admit: 2019-07-11 | Discharge: 2019-07-11 | Disposition: A | Discharge status: Home / Self Care | Payer: BC Managed Care – PPO | Attending: Obstetrics and Gynecology | Admitting: Obstetrics and Gynecology

## 2019-07-11 ENCOUNTER — Other Ambulatory Visit: Payer: Self-pay

## 2019-07-11 ENCOUNTER — Inpatient Hospital Stay (HOSPITAL_BASED_OUTPATIENT_CLINIC_OR_DEPARTMENT_OTHER): Payer: BC Managed Care – PPO

## 2019-07-11 ENCOUNTER — Encounter (HOSPITAL_COMMUNITY): Payer: Self-pay | Admitting: Obstetrics and Gynecology

## 2019-07-11 DIAGNOSIS — O09513 Supervision of elderly primigravida, third trimester: Secondary | ICD-10-CM | POA: Diagnosis not present

## 2019-07-11 DIAGNOSIS — O99413 Diseases of the circulatory system complicating pregnancy, third trimester: Secondary | ICD-10-CM

## 2019-07-11 DIAGNOSIS — O99283 Endocrine, nutritional and metabolic diseases complicating pregnancy, third trimester: Secondary | ICD-10-CM

## 2019-07-11 DIAGNOSIS — O26833 Pregnancy related renal disease, third trimester: Secondary | ICD-10-CM | POA: Insufficient documentation

## 2019-07-11 DIAGNOSIS — E039 Hypothyroidism, unspecified: Secondary | ICD-10-CM

## 2019-07-11 DIAGNOSIS — N181 Chronic kidney disease, stage 1: Secondary | ICD-10-CM | POA: Insufficient documentation

## 2019-07-11 DIAGNOSIS — O321XX0 Maternal care for breech presentation, not applicable or unspecified: Secondary | ICD-10-CM | POA: Insufficient documentation

## 2019-07-11 DIAGNOSIS — Z85850 Personal history of malignant neoplasm of thyroid: Secondary | ICD-10-CM | POA: Diagnosis not present

## 2019-07-11 DIAGNOSIS — O4103X0 Oligohydramnios, third trimester, not applicable or unspecified: Secondary | ICD-10-CM

## 2019-07-11 DIAGNOSIS — Z3A36 36 weeks gestation of pregnancy: Secondary | ICD-10-CM

## 2019-07-11 DIAGNOSIS — Z3689 Encounter for other specified antenatal screening: Secondary | ICD-10-CM

## 2019-07-11 DIAGNOSIS — I499 Cardiac arrhythmia, unspecified: Secondary | ICD-10-CM

## 2019-07-11 DIAGNOSIS — E063 Autoimmune thyroiditis: Secondary | ICD-10-CM | POA: Insufficient documentation

## 2019-07-11 NOTE — Discharge Instructions (Signed)

## 2019-07-11 NOTE — MAU Provider Note (Signed)
History     CSN: [id number]  Arrival date and time: 07/11/19 0906   First Provider Initiated Contact with Patient 07/11/19 [phone]      Chief Complaint  Patient presents with   Non-stress Test   BPP   HPI Lauren Morton is a 34 y.o. G1P0 at [redacted]w[redacted]d who presents for a repeat BPP. Dr. Royston Sinner called the MAU provider and informed CNM that patient was seen in office and diagnosed with oligohydramnios yesterday. She had a BPP of 6/8 in the office and recommended at repeat at 24 hours. She is scheduled in the office on Monday 7/12 for ROB follow up.  Patient denies any pain, vaginal bleeding or leaking of fluid. Reports normal fetal movement.   OB History     Gravida  1   Para      Term      Preterm      AB      Living         SAB      TAB      Ectopic      Multiple      Live Births              Past Medical History:  Diagnosis Date   CKD (chronic kidney disease) stage 1, GFR 90 ml/min or greater 09/2013   mild proteinuria [facility]) sees yearly   Hashimoto's thyroiditis    confirmed by biopsy s/p thyroidectomy   Hypothyroidism, acquired, autoimmune    Papillary thyroid carcinoma ([place]) 03/31/2014   s/p thyroidectomy, 10 negative LN    Past Surgical History:  Procedure Laterality Date   LYMPH NODE DISSECTION N/A 06/03/2014   Procedure: LIMITED LYMPH NODE DISSECTION;  Surgeon: Armandina Gemma, MD;  Location: [facility];  Service: General;  Laterality: N/A;   THYROIDECTOMY N/A 06/03/2014   total - 1.5cm papillary thyroid carcinoma with negative surgical margins and 10/10 LN neg for mets; Armandina Gemma, MD   TOTAL THYROIDECTOMY  06/2014   with limited central lymph node dissection ([place])   WISDOM TOOTH EXTRACTION     4    Family History  Problem Relation Age of Onset   Cancer Mother 68       breast cancer, s/p mastectomy   Diabetes Father    Coronary artery disease Maternal Grandfather        several MIs   Heart disease Maternal Grandfather    Heart failure  Maternal Grandfather    Cancer Maternal Grandmother        breast cancer s/p lumpectomy   Thyroid disease Maternal Grandmother    Alcohol abuse Paternal Grandmother    Stroke Neg Hx     Social History   Tobacco Use   Smoking status: Never Smoker   Smokeless tobacco: Never Used  Substance Use Topics   Alcohol use: Not Currently   Drug use: No    Allergies: No Known Allergies  Medications Prior to Admission  Medication Sig Dispense Refill Last Dose   levothyroxine (SYNTHROID) 200 MCG tablet Take 1 tablet (200 mcg total) by mouth daily before breakfast. 45 tablet 3     Review of Systems  Constitutional: Negative.  Negative for fatigue and fever.  HENT: Negative.   Respiratory: Negative.  Negative for shortness of breath.   Cardiovascular: Negative.  Negative for chest pain.  Gastrointestinal: Negative.  Negative for abdominal pain, constipation, diarrhea, nausea and vomiting.  Genitourinary: Negative.  Negative for dysuria, vaginal bleeding and vaginal discharge.  Neurological: Negative.  Negative for dizziness and headaches.   Physical Exam   Blood pressure 115/85, pulse 89, temperature 97.7 F (36.5 C), temperature source Oral, resp. rate 16, last menstrual period 10/29/2018, SpO2 100 %.  Physical Exam Vitals and nursing note reviewed.  Constitutional:      General: She is not in acute distress.    Appearance: She is well-developed.  HENT:     Head: Normocephalic.  Eyes:     Pupils: Pupils are equal, round, and reactive to light.  Cardiovascular:     Rate and Rhythm: Normal rate and regular rhythm.     Heart sounds: Normal heart sounds.  Pulmonary:     Effort: Pulmonary effort is normal. No respiratory distress.     Breath sounds: Normal breath sounds.  Abdominal:     General: Bowel sounds are normal. There is no distension.     Palpations: Abdomen is soft.     Tenderness: There is no abdominal tenderness.  Skin:    General: Skin is warm and dry.   Neurological:     Mental Status: She is alert and oriented to person, place, and time.  Psychiatric:        Behavior: Behavior normal.        Thought Content: Thought content normal.        Judgment: Judgment normal.    Fetal Tracing:  Baseline: 125 Variability: moderate Accels: 15x15 Decels: none  Toco: UI   MAU Course  Procedures  MDM [place] MFM OB BPP NST reactive  Dr. Royston Sinner called regarding patient: BPP 6/8, off for breathing, AFI 7.6cm. Antenatal testing 8/10 today with reactive NST. Dr. Royston Sinner recommends discharge home and follow up in office on Monday as scheduled for prenatal care.   Assessment and Plan   1. NST (non-stress test) reactive   2. [redacted] weeks gestation of pregnancy    -Discharge home in stable condition -Fetal kick count precautions discussed -Patient advised to follow-up with [facility] on Monday as scheduled for prenatal care -Patient may return to MAU as needed or if her condition were to change or worsen   Wende Mott CNM 07/11/2019, 9:28 AM

## 2019-07-11 NOTE — MAU Note (Signed)
Emalie Mcwethy is a 34 y.o. at [redacted]w[redacted]d here in MAU reporting:  For NST and BPP. Denies vaginal bleeding, LOF, or contractions. Pain score: denies Vitals:   07/11/19 0916  BP: 115/85  Pulse: 89  Resp: 16  Temp: 97.7 F (36.5 C)  SpO2: 100%     FHT: +FM; 140 Lab orders placed from triage: none

## 2019-07-13 ENCOUNTER — Encounter (HOSPITAL_COMMUNITY): Payer: Self-pay

## 2019-07-13 DIAGNOSIS — O4103X0 Oligohydramnios, third trimester, not applicable or unspecified: Secondary | ICD-10-CM | POA: Diagnosis not present

## 2019-07-13 DIAGNOSIS — Z3A36 36 weeks gestation of pregnancy: Secondary | ICD-10-CM | POA: Diagnosis not present

## 2019-07-13 NOTE — Patient Instructions (Signed)
Lauren Morton  07/13/2019   Your procedure is scheduled on:  07/15/2019  Arrive at [place] at [employer] [facility] on [address] at [facility]  and [address]. You are invited to use the FREE valet parking or use the Visitor's parking deck.  Pick up the phone at the desk and dial [phone].  Call this number if you have problems the morning of surgery: [phone]  Remember:   Do not eat food:(After Midnight) Desps de medianoche.  Do not drink clear liquids: (After Midnight) Desps de medianoche.  Take these medicines the morning of surgery with A SIP OF WATER:  levothyroxine   Do not wear jewelry, make-up or nail polish.  Do not wear lotions, powders, or perfumes. Do not wear deodorant.  Do not shave 48 hours prior to surgery.  Do not bring valuables to the hospital.  [facility] is not   responsible for any belongings or valuables brought to the hospital.  Contacts, dentures or bridgework may not be worn into surgery.  Leave suitcase in the car. After surgery it may be brought to your room.  For patients admitted to the hospital, checkout time is 11:00 AM the day of              discharge.      Please read over the following fact sheets that you were given:     Preparing for Surgery

## 2019-07-14 ENCOUNTER — Encounter (HOSPITAL_COMMUNITY): Payer: Self-pay | Admitting: Obstetrics and Gynecology

## 2019-07-14 ENCOUNTER — Other Ambulatory Visit: Payer: Self-pay

## 2019-07-14 ENCOUNTER — Other Ambulatory Visit (HOSPITAL_COMMUNITY)
Admission: RE | Admit: 2019-07-14 | Discharge: 2019-07-14 | Disposition: A | Discharge status: Home / Self Care | Payer: BC Managed Care – PPO | Source: Ambulatory Visit | Attending: Obstetrics and Gynecology | Admitting: Obstetrics and Gynecology

## 2019-07-14 DIAGNOSIS — Z3A37 37 weeks gestation of pregnancy: Secondary | ICD-10-CM | POA: Diagnosis not present

## 2019-07-14 DIAGNOSIS — O9942 Diseases of the circulatory system complicating childbirth: Secondary | ICD-10-CM | POA: Diagnosis not present

## 2019-07-14 DIAGNOSIS — E890 Postprocedural hypothyroidism: Secondary | ICD-10-CM | POA: Diagnosis not present

## 2019-07-14 DIAGNOSIS — O99413 Diseases of the circulatory system complicating pregnancy, third trimester: Secondary | ICD-10-CM | POA: Diagnosis not present

## 2019-07-14 DIAGNOSIS — O43893 Other placental disorders, third trimester: Secondary | ICD-10-CM | POA: Diagnosis not present

## 2019-07-14 DIAGNOSIS — O26833 Pregnancy related renal disease, third trimester: Secondary | ICD-10-CM | POA: Diagnosis not present

## 2019-07-14 DIAGNOSIS — O99284 Endocrine, nutritional and metabolic diseases complicating childbirth: Secondary | ICD-10-CM | POA: Diagnosis not present

## 2019-07-14 DIAGNOSIS — N181 Chronic kidney disease, stage 1: Secondary | ICD-10-CM | POA: Diagnosis not present

## 2019-07-14 DIAGNOSIS — Z20822 Contact with and (suspected) exposure to covid-19: Secondary | ICD-10-CM | POA: Diagnosis not present

## 2019-07-14 DIAGNOSIS — O4103X0 Oligohydramnios, third trimester, not applicable or unspecified: Secondary | ICD-10-CM | POA: Diagnosis not present

## 2019-07-14 DIAGNOSIS — O321XX0 Maternal care for breech presentation, not applicable or unspecified: Secondary | ICD-10-CM | POA: Diagnosis not present

## 2019-07-14 DIAGNOSIS — I471 Supraventricular tachycardia: Secondary | ICD-10-CM | POA: Diagnosis not present

## 2019-07-14 DIAGNOSIS — Z2882 Immunization not carried out because of caregiver refusal: Secondary | ICD-10-CM | POA: Diagnosis not present

## 2019-07-14 HISTORY — DX: Cardiac arrhythmia, unspecified: I49.9

## 2019-07-14 LAB — CBC
HCT: 35.6 % — ABNORMAL LOW (ref 36.0–46.0)
Hemoglobin: 11.7 g/dL — ABNORMAL LOW (ref 12.0–15.0)
MCH: 30.3 pg (ref 26.0–34.0)
MCHC: 32.9 g/dL (ref 30.0–36.0)
MCV: 92.2 fL (ref 80.0–100.0)
Platelets: 212 10*3/uL (ref 150–400)
RBC: 3.86 MIL/uL — ABNORMAL LOW (ref 3.87–5.11)
RDW: 12.9 % (ref 11.5–15.5)
WBC: 9.2 10*3/uL (ref 4.0–10.5)
nRBC: 0 % (ref 0.0–0.2)

## 2019-07-14 LAB — TYPE AND SCREEN
ABO/RH(D): A POS
Antibody Screen: NEGATIVE

## 2019-07-14 LAB — SARS CORONAVIRUS 2 (TAT 6-24 HRS): SARS Coronavirus 2: NEGATIVE

## 2019-07-14 LAB — RPR: RPR Ser Ql: NONREACTIVE

## 2019-07-14 NOTE — Anesthesia Preprocedure Evaluation (Addendum)
Anesthesia Evaluation  Patient identified by MRN, date of birth, ID band Patient awake    Reviewed: Allergy & Precautions, NPO status , Patient's Chart, lab work & pertinent test results  History of Anesthesia Complications Negative for: history of anesthetic complications  Airway Mallampati: II  TM Distance: >3 FB Neck ROM: Full    Dental no notable dental hx.    Pulmonary neg pulmonary ROS,    Pulmonary exam normal        Cardiovascular Normal cardiovascular exam+ dysrhythmias (several episodes during pregnancy requiring adenosine (last episode 07/15/19, received adenosine in ED this morning, now in SR)) Supra Ventricular Tachycardia      Neuro/Psych negative neurological ROS  negative psych ROS   GI/Hepatic negative GI ROS, Neg liver ROS,   Endo/Other  Hypothyroidism (s/p thyroidectomy)   Renal/GU Renal InsufficiencyRenal disease  negative genitourinary   Musculoskeletal negative musculoskeletal ROS (+)   Abdominal   Peds  Hematology negative hematology ROS (+)   Anesthesia Other Findings Day of surgery medications reviewed with patient.  Reproductive/Obstetrics (+) Pregnancy (breech, oligohydramnios)                            Anesthesia Physical Anesthesia Plan  ASA: III  Anesthesia Plan: Spinal   Post-op Pain Management:    Induction:   PONV Risk Score and Plan: 4 or greater and Treatment may vary due to age or medical condition, Ondansetron and Dexamethasone  Airway Management Planned: Natural Airway  Additional Equipment: None  Intra-op Plan:   Post-operative Plan:   Informed Consent: I have reviewed the patients History and Physical, chart, labs and discussed the procedure including the risks, benefits and alternatives for the proposed anesthesia with the patient or authorized representative who has indicated his/her understanding and acceptance.       Plan  Discussed with: CRNA  Anesthesia Plan Comments:        Anesthesia Quick Evaluation

## 2019-07-14 NOTE — MAU Note (Signed)
Asymptomatic, swab collected. Lab called 

## 2019-07-15 ENCOUNTER — Inpatient Hospital Stay (HOSPITAL_COMMUNITY)
Admission: EM | Admit: 2019-07-15 | Discharge: 2019-07-17 | DRG: 786 | Disposition: A | Discharge status: Home / Self Care | Payer: BC Managed Care – PPO | Attending: Obstetrics and Gynecology | Admitting: Obstetrics and Gynecology

## 2019-07-15 ENCOUNTER — Inpatient Hospital Stay (HOSPITAL_COMMUNITY)
Admission: RE | Admit: 2019-07-15 | Payer: BC Managed Care – PPO | Source: Home / Self Care | Admitting: Obstetrics and Gynecology

## 2019-07-15 ENCOUNTER — Other Ambulatory Visit: Payer: Self-pay

## 2019-07-15 ENCOUNTER — Inpatient Hospital Stay (HOSPITAL_COMMUNITY): Payer: BC Managed Care – PPO | Admitting: Anesthesiology

## 2019-07-15 ENCOUNTER — Encounter (HOSPITAL_COMMUNITY)
Admission: EM | Disposition: A | Discharge status: Home / Self Care | Payer: Self-pay | Source: Home / Self Care | Attending: Obstetrics and Gynecology

## 2019-07-15 ENCOUNTER — Encounter (HOSPITAL_COMMUNITY): Payer: Self-pay | Admitting: Obstetrics and Gynecology

## 2019-07-15 DIAGNOSIS — I471 Supraventricular tachycardia: Secondary | ICD-10-CM | POA: Diagnosis present

## 2019-07-15 DIAGNOSIS — O4103X0 Oligohydramnios, third trimester, not applicable or unspecified: Secondary | ICD-10-CM | POA: Diagnosis present

## 2019-07-15 DIAGNOSIS — O99284 Endocrine, nutritional and metabolic diseases complicating childbirth: Secondary | ICD-10-CM | POA: Diagnosis present

## 2019-07-15 DIAGNOSIS — E890 Postprocedural hypothyroidism: Secondary | ICD-10-CM | POA: Diagnosis present

## 2019-07-15 DIAGNOSIS — N181 Chronic kidney disease, stage 1: Secondary | ICD-10-CM | POA: Diagnosis present

## 2019-07-15 DIAGNOSIS — O9942 Diseases of the circulatory system complicating childbirth: Secondary | ICD-10-CM | POA: Diagnosis present

## 2019-07-15 DIAGNOSIS — Z20822 Contact with and (suspected) exposure to covid-19: Secondary | ICD-10-CM | POA: Diagnosis present

## 2019-07-15 DIAGNOSIS — O26833 Pregnancy related renal disease, third trimester: Secondary | ICD-10-CM | POA: Diagnosis present

## 2019-07-15 DIAGNOSIS — O321XX0 Maternal care for breech presentation, not applicable or unspecified: Principal | ICD-10-CM | POA: Diagnosis present

## 2019-07-15 DIAGNOSIS — Z3493 Encounter for supervision of normal pregnancy, unspecified, third trimester: Secondary | ICD-10-CM

## 2019-07-15 DIAGNOSIS — Z3A37 37 weeks gestation of pregnancy: Secondary | ICD-10-CM | POA: Diagnosis not present

## 2019-07-15 SURGERY — Surgical Case
Anesthesia: Spinal | Site: Abdomen | Wound class: Clean Contaminated

## 2019-07-15 MED ORDER — SODIUM CHLORIDE 0.9 % IR SOLN
Status: DC | PRN
  Administered 2019-07-15: 1000 mL

## 2019-07-15 MED ORDER — SODIUM CHLORIDE 0.9 % IV SOLN
INTRAVENOUS | Status: AC
  Filled 2019-07-15: qty 2

## 2019-07-15 MED ORDER — SIMETHICONE 80 MG PO CHEW
80.0000 mg | CHEWABLE_TABLET | ORAL | Status: DC
  Administered 2019-07-16 (×2): 80 mg via ORAL
  Filled 2019-07-15 (×2): qty 1

## 2019-07-15 MED ORDER — PHENYLEPHRINE HCL-NACL 20-0.9 MG/250ML-% IV SOLN
INTRAVENOUS | Status: DC | PRN
  Administered 2019-07-15: 60 ug/min via INTRAVENOUS

## 2019-07-15 MED ORDER — ONDANSETRON HCL 4 MG/2ML IJ SOLN
INTRAMUSCULAR | Status: DC | PRN
  Administered 2019-07-15: 4 mg via INTRAVENOUS

## 2019-07-15 MED ORDER — MORPHINE SULFATE (PF) 0.5 MG/ML IJ SOLN
INTRAMUSCULAR | Status: DC | PRN
  Administered 2019-07-15: 150 ug via INTRATHECAL

## 2019-07-15 MED ORDER — ZOLPIDEM TARTRATE 5 MG PO TABS: 5.0000 mg | ORAL_TABLET | Freq: Every evening | ORAL | Status: DC | PRN

## 2019-07-15 MED ORDER — ADENOSINE 6 MG/2ML IV SOLN
6.0000 mg | Freq: Once | INTRAVENOUS | Status: AC
  Administered 2019-07-15: 6 mg via INTRAVENOUS

## 2019-07-15 MED ORDER — BUPIVACAINE IN DEXTROSE 0.75-8.25 % IT SOLN
INTRATHECAL | Status: DC | PRN
  Administered 2019-07-15: 1.6 mL via INTRATHECAL

## 2019-07-15 MED ORDER — OXYTOCIN-SODIUM CHLORIDE 30-0.9 UT/500ML-% IV SOLN: INTRAVENOUS | Status: DC | PRN

## 2019-07-15 MED ORDER — ACETAMINOPHEN 325 MG PO TABS
650.0000 mg | ORAL_TABLET | ORAL | Status: DC | PRN
  Administered 2019-07-15 – 2019-07-16 (×3): 650 mg via ORAL
  Filled 2019-07-15 (×3): qty 2

## 2019-07-15 MED ORDER — OXYTOCIN-SODIUM CHLORIDE 30-0.9 UT/500ML-% IV SOLN
INTRAVENOUS | Status: AC
  Filled 2019-07-15: qty 500

## 2019-07-15 MED ORDER — ONDANSETRON HCL 4 MG/2ML IJ SOLN
INTRAMUSCULAR | Status: AC
  Filled 2019-07-15: qty 2

## 2019-07-15 MED ORDER — IBUPROFEN 600 MG PO TABS
600.0000 mg | ORAL_TABLET | Freq: Four times a day (QID) | ORAL | Status: DC | PRN
  Administered 2019-07-15 – 2019-07-17 (×5): 600 mg via ORAL
  Filled 2019-07-15 (×5): qty 1

## 2019-07-15 MED ORDER — DEXAMETHASONE SODIUM PHOSPHATE 4 MG/ML IJ SOLN
INTRAMUSCULAR | Status: AC
  Filled 2019-07-15: qty 2

## 2019-07-15 MED ORDER — PHENYLEPHRINE HCL-NACL 20-0.9 MG/250ML-% IV SOLN
INTRAVENOUS | Status: AC
  Filled 2019-07-15: qty 250

## 2019-07-15 MED ORDER — METOCLOPRAMIDE HCL 5 MG/ML IJ SOLN
INTRAMUSCULAR | Status: AC
  Filled 2019-07-15: qty 2

## 2019-07-15 MED ORDER — POVIDONE-IODINE 10 % EX SWAB: 2.0000 "application " | Freq: Once | CUTANEOUS | Status: DC

## 2019-07-15 MED ORDER — COCONUT OIL OIL
1.0000 "application " | TOPICAL_OIL | Status: DC | PRN
  Administered 2019-07-16: 1 via TOPICAL

## 2019-07-15 MED ORDER — ACETAMINOPHEN 500 MG PO TABS: 1000.0000 mg | ORAL_TABLET | Freq: Once | ORAL | Status: DC

## 2019-07-15 MED ORDER — FENTANYL CITRATE (PF) 100 MCG/2ML IJ SOLN
INTRAMUSCULAR | Status: AC
  Filled 2019-07-15: qty 2

## 2019-07-15 MED ORDER — KETOROLAC TROMETHAMINE 30 MG/ML IJ SOLN
30.0000 mg | Freq: Once | INTRAMUSCULAR | Status: AC
  Administered 2019-07-15: 30 mg via INTRAVENOUS

## 2019-07-15 MED ORDER — LACTATED RINGERS IV SOLN: INTRAVENOUS | Status: DC

## 2019-07-15 MED ORDER — OXYTOCIN-SODIUM CHLORIDE 30-0.9 UT/500ML-% IV SOLN
2.5000 [IU]/h | INTRAVENOUS | Status: AC
  Administered 2019-07-15: 2.5 [IU]/h via INTRAVENOUS
  Filled 2019-07-15: qty 500

## 2019-07-15 MED ORDER — SODIUM CHLORIDE 0.9 % IV SOLN
2.0000 g | INTRAVENOUS | Status: AC
  Administered 2019-07-15: 2 g via INTRAVENOUS

## 2019-07-15 MED ORDER — OXYTOCIN-SODIUM CHLORIDE 30-0.9 UT/500ML-% IV SOLN
INTRAVENOUS | Status: DC | PRN
  Administered 2019-07-15: 300 mL via INTRAVENOUS

## 2019-07-15 MED ORDER — MENTHOL 3 MG MT LOZG: 1.0000 | LOZENGE | OROMUCOSAL | Status: DC | PRN

## 2019-07-15 MED ORDER — ADENOSINE 6 MG/2ML IV SOLN
INTRAVENOUS | Status: AC
  Filled 2019-07-15: qty 6

## 2019-07-15 MED ORDER — STERILE WATER FOR IRRIGATION IR SOLN
Status: DC | PRN
  Administered 2019-07-15: 1

## 2019-07-15 MED ORDER — OXYCODONE HCL 5 MG PO TABS: 5.0000 mg | ORAL_TABLET | ORAL | Status: DC | PRN

## 2019-07-15 MED ORDER — PROMETHAZINE HCL 25 MG/ML IJ SOLN: 6.2500 mg | INTRAMUSCULAR | Status: DC | PRN

## 2019-07-15 MED ORDER — PRENATAL MULTIVITAMIN CH
1.0000 | ORAL_TABLET | Freq: Every day | ORAL | Status: DC
  Administered 2019-07-16: 1 via ORAL
  Filled 2019-07-15 (×2): qty 1

## 2019-07-15 MED ORDER — DIPHENHYDRAMINE HCL 25 MG PO CAPS
25.0000 mg | ORAL_CAPSULE | Freq: Four times a day (QID) | ORAL | Status: DC | PRN
  Administered 2019-07-15: 25 mg via ORAL
  Filled 2019-07-15: qty 1

## 2019-07-15 MED ORDER — SIMETHICONE 80 MG PO CHEW
80.0000 mg | CHEWABLE_TABLET | ORAL | Status: DC | PRN
  Administered 2019-07-15: 80 mg via ORAL
  Filled 2019-07-15: qty 1

## 2019-07-15 MED ORDER — MORPHINE SULFATE (PF) 0.5 MG/ML IJ SOLN
INTRAMUSCULAR | Status: AC
  Filled 2019-07-15: qty 10

## 2019-07-15 MED ORDER — KETOROLAC TROMETHAMINE 30 MG/ML IJ SOLN
INTRAMUSCULAR | Status: AC
  Filled 2019-07-15: qty 1

## 2019-07-15 MED ORDER — ACETAMINOPHEN 160 MG/5ML PO SOLN: 1000.0000 mg | Freq: Once | ORAL | Status: DC

## 2019-07-15 MED ORDER — DEXAMETHASONE SODIUM PHOSPHATE 4 MG/ML IJ SOLN
INTRAMUSCULAR | Status: DC | PRN
  Administered 2019-07-15: 4 mg via INTRAVENOUS

## 2019-07-15 MED ORDER — TETANUS-DIPHTH-ACELL PERTUSSIS 5-2.5-18.5 LF-MCG/0.5 IM SUSP: 0.5000 mL | Freq: Once | INTRAMUSCULAR | Status: DC

## 2019-07-15 MED ORDER — SENNOSIDES-DOCUSATE SODIUM 8.6-50 MG PO TABS
2.0000 | ORAL_TABLET | ORAL | Status: DC
  Administered 2019-07-16 (×2): 2 via ORAL
  Filled 2019-07-15 (×2): qty 2

## 2019-07-15 MED ORDER — LEVOTHYROXINE SODIUM 200 MCG PO TABS
200.0000 ug | ORAL_TABLET | Freq: Every day | ORAL | Status: DC
  Administered 2019-07-16 – 2019-07-17 (×2): 200 ug via ORAL
  Filled 2019-07-15: qty 2
  Filled 2019-07-15 (×2): qty 1

## 2019-07-15 MED ORDER — FENTANYL CITRATE (PF) 100 MCG/2ML IJ SOLN: 25.0000 ug | INTRAMUSCULAR | Status: DC | PRN

## 2019-07-15 MED ORDER — FENTANYL CITRATE (PF) 100 MCG/2ML IJ SOLN
INTRAMUSCULAR | Status: DC | PRN
  Administered 2019-07-15: 15 ug via INTRATHECAL

## 2019-07-15 MED ORDER — PHENYLEPHRINE 40 MCG/ML (10ML) SYRINGE FOR IV PUSH (FOR BLOOD PRESSURE SUPPORT)
PREFILLED_SYRINGE | INTRAVENOUS | Status: AC
  Filled 2019-07-15: qty 10

## 2019-07-15 MED ORDER — HYDROMORPHONE HCL 1 MG/ML IJ SOLN: 0.2000 mg | INTRAMUSCULAR | Status: DC | PRN

## 2019-07-15 MED ORDER — SIMETHICONE 80 MG PO CHEW
80.0000 mg | CHEWABLE_TABLET | Freq: Three times a day (TID) | ORAL | Status: DC
  Administered 2019-07-15 – 2019-07-16 (×5): 80 mg via ORAL
  Filled 2019-07-15 (×5): qty 1

## 2019-07-15 SURGICAL SUPPLY — 39 items
APL SKNCLS STERI-STRIP NONHPOA (GAUZE/BANDAGES/DRESSINGS) ×1
BARRIER ADHS 3X4 INTERCEED (GAUZE/BANDAGES/DRESSINGS) IMPLANT
BENZOIN TINCTURE PRP APPL 2/3 (GAUZE/BANDAGES/DRESSINGS) ×1 IMPLANT
BRR ADH 4X3 ABS CNTRL BYND (GAUZE/BANDAGES/DRESSINGS)
CHLORAPREP W/TINT 26ML (MISCELLANEOUS) ×2 IMPLANT
CLAMP CORD UMBIL (MISCELLANEOUS) IMPLANT
CLOTH BEACON ORANGE TIMEOUT ST (SAFETY) ×2 IMPLANT
CLSR STERI-STRIP ANTIMIC 1/2X4 (GAUZE/BANDAGES/DRESSINGS) ×1 IMPLANT
DRSG OPSITE POSTOP 4X10 (GAUZE/BANDAGES/DRESSINGS) ×2 IMPLANT
ELECT REM PT RETURN 9FT ADLT (ELECTROSURGICAL) ×2
ELECTRODE REM PT RTRN 9FT ADLT (ELECTROSURGICAL) ×1 IMPLANT
EXTRACTOR VACUUM M CUP 4 TUBE (SUCTIONS) IMPLANT
GLOVE BIO SURGEON STRL SZ 6.5 (GLOVE) ×2 IMPLANT
GLOVE BIOGEL PI IND STRL 7.0 (GLOVE) ×1 IMPLANT
GLOVE BIOGEL PI INDICATOR 7.0 (GLOVE) ×1
GOWN STRL REUS W/TWL LRG LVL3 (GOWN DISPOSABLE) ×4 IMPLANT
KIT ABG SYR 3ML LUER SLIP (SYRINGE) IMPLANT
NDL HYPO 25X5/8 SAFETYGLIDE (NEEDLE) ×1 IMPLANT
NEEDLE HYPO 22GX1.5 SAFETY (NEEDLE) IMPLANT
NEEDLE HYPO 25X5/8 SAFETYGLIDE (NEEDLE) ×2 IMPLANT
NS IRRIG 1000ML POUR BTL (IV SOLUTION) ×2 IMPLANT
PACK C SECTION WH (CUSTOM PROCEDURE TRAY) ×2 IMPLANT
PAD ABD DERMACEA PRESS 5X9 (GAUZE/BANDAGES/DRESSINGS) ×1 IMPLANT
PAD OB MATERNITY 4.3X12.25 (PERSONAL CARE ITEMS) ×2 IMPLANT
PENCIL SMOKE EVAC W/HOLSTER (ELECTROSURGICAL) ×2 IMPLANT
SUT CHROMIC 0 CTX 36 (SUTURE) ×4 IMPLANT
SUT CHROMIC 2 0 SH (SUTURE) ×1 IMPLANT
SUT PLAIN 0 NONE (SUTURE) IMPLANT
SUT PLAIN 2 0 (SUTURE) ×2
SUT PLAIN 2 0 XLH (SUTURE) IMPLANT
SUT PLAIN ABS 2-0 CT1 27XMFL (SUTURE) IMPLANT
SUT VIC AB 0 CT1 27 (SUTURE) ×6
SUT VIC AB 0 CT1 27XBRD ANBCTR (SUTURE) ×3 IMPLANT
SUT VIC AB 4-0 KS 27 (SUTURE) IMPLANT
SYR CONTROL 10ML LL (SYRINGE) IMPLANT
TOWEL OR 17X24 6PK STRL BLUE (TOWEL DISPOSABLE) ×2 IMPLANT
TRAY FOLEY W/BAG SLVR 14FR LF (SET/KITS/TRAYS/PACK) ×2 IMPLANT
VACUUM CUP M-STYLE MYSTIC II (SUCTIONS) IMPLANT
WATER STERILE IRR 1000ML POUR (IV SOLUTION) ×2 IMPLANT

## 2019-07-15 NOTE — ED Notes (Signed)
Patient transported to OR holding unit by OB rapid response nurse with spouse.

## 2019-07-15 NOTE — Transfer of Care (Signed)
Immediate Anesthesia Transfer of Care Note  Patient: Lauren Morton  Procedure(s) Performed: [place] (N/A Abdomen)  Patient Location: PACU  Anesthesia Type:Spinal  Level of Consciousness: awake, alert  and oriented  Airway & Oxygen Therapy: Patient Spontanous Breathing  Post-op Assessment: Report given to RN and Post -op Vital signs reviewed and stable  Post vital signs: Reviewed and stable  Last Vitals:  Vitals Value Taken Time  BP 102/63 07/15/19 0826  Temp    Pulse 93 07/15/19 0828  Resp 22 07/15/19 0828  SpO2 98 % 07/15/19 0828  Vitals shown include unvalidated device data.  Last Pain:  Vitals:   07/15/19 0453  TempSrc: Oral  PainSc: 0-No pain         Complications: No complications documented.

## 2019-07-15 NOTE — Consult Note (Signed)
Neonatology Note:   Attendance at C-section:    I was asked by Dr. Gaetano Net to attend this primary C/S at term for breech presenting. The mother is a G1, GBS neg with good prenatal care complicated by oligohydramnios, AMA, chronic kidney disease, onset of repeat episodes of pSVT, and hypothyroidism s/p Hashimoto's with thyroidectomy. AROM just prior to delivery, fluid clear. Infant vigorous with good spontaneous cry and tone.  +60sec DCC.  Needed minimal bulb suctioning.   Ap 8/9. Lungs clear to ausc in DR. Family updated.  To CN to care of Pediatrician.  Monia Sabal Katherina Mires, MD

## 2019-07-15 NOTE — Brief Op Note (Signed)
07/15/2019  8:06 AM  PATIENT:  Lauren Morton  34 y.o. female  PRE-OPERATIVE DIAGNOSIS:  breech and oligo  POST-OPERATIVE DIAGNOSIS:  breech and oligo  PROCEDURE:  Procedure(s): PRIAMRY CESAREAN SECTION (N/A)  SURGEON:  Surgeon(s) and Role:    * Everlene Farrier, MD - Primary  PHYSICIAN ASSISTANT:   ASSISTANTS: Mylinda Latina RNFA   ANESTHESIA:   spinal  EBL:  Per anesthesiology note   BLOOD ADMINISTERED:none  DRAINS: Urinary Catheter (Foley)   LOCAL MEDICATIONS USED:  NONE  SPECIMEN:  Source of Specimen:  placenta  DISPOSITION OF SPECIMEN:  PATHOLOGY  COUNTS:  YES  TOURNIQUET:  * No tourniquets in log *  DICTATION: .Other Dictation: Dictation Number [id number]  PLAN OF CARE: Admit to inpatient   PATIENT DISPOSITION:  PACU - hemodynamically stable.   Delay start of Pharmacological VTE agent (>24hrs) due to surgical blood loss or risk of bleeding: not applicable

## 2019-07-15 NOTE — Anesthesia Procedure Notes (Signed)
Spinal  Patient location during procedure: OR Start time: 07/15/2019 7:22 AM End time: 07/15/2019 7:25 AM Staffing Performed: anesthesiologist  Anesthesiologist: Brennan Bailey, MD Preanesthetic Checklist Completed: patient identified, IV checked, risks and benefits discussed, monitors and equipment checked, pre-op evaluation and timeout performed Spinal Block Patient position: sitting Prep: DuraPrep and site prepped and draped Patient monitoring: heart rate, continuous pulse ox and blood pressure Approach: midline Location: L3-4 Injection technique: single-shot Needle Needle type: Pencan  Needle gauge: 24 G Needle length: 10 cm Assessment Sensory level: T4 Additional Notes Risks, benefits, and alternative discussed. Patient gave consent to procedure. Prepped and draped in sitting position. Clear CSF obtained after one needle pass. Positive terminal aspiration. No pain or paraesthesias with injection. Patient tolerated procedure well. Vital signs stable. Tawny Asal, MD

## 2019-07-15 NOTE — ED Provider Notes (Addendum)
[facility] EMERGENCY DEPARTMENT Provider Note   CSN: [id number] Arrival date & time: 07/15/19  0255   History Chief Complaint  Patient presents with  . Tachycardia    Lauren Morton is a 34 y.o. female.  The history is provided by the patient.  She has history of Hashimoto's thyroiditis, papillary thyroid carcinoma, chronic kidney disease, PSVT and is currently at [redacted] weeks gestation.  She had onset at about 2 AM sense of rapid heartbeat typical of her episodes of PSVT.  She tried Valsalva maneuver at home without any change.  She has had 2 other episodes of PSVT during this pregnancy.  She is gravida 1, para 0.  She is scheduled for elective cesarean section in about 4 hours because of oligohydramnios.  She has had proteinuria and peripheral edema during pregnancy but no hypertension.  Past Medical History:  Diagnosis Date  . CKD (chronic kidney disease) stage 1, GFR 90 ml/min or greater 09/2013   mild proteinuria [facility]) sees yearly  . Dysrhythmia    SVT during pregnancy  . Hashimoto's thyroiditis    confirmed by biopsy s/p thyroidectomy  . Hypothyroidism, acquired, autoimmune   . Papillary thyroid carcinoma ([place]) 03/31/2014   s/p thyroidectomy, 10 negative LN    Patient Active Problem List   Diagnosis Date Noted  . Postsurgical hypothyroidism 08/18/2014  . Health maintenance examination 03/31/2014  . Papillary thyroid carcinoma ([place]) 03/31/2014  . CKD (chronic kidney disease) stage 1, GFR 90 ml/min or greater 09/01/2013  . History of Hashimoto thyroiditis 03/24/2013    Past Surgical History:  Procedure Laterality Date  . LYMPH NODE DISSECTION N/A 06/03/2014   Procedure: LIMITED LYMPH NODE DISSECTION;  Surgeon: Armandina Gemma, MD;  Location: [facility] ORS;  Service: General;  Laterality: N/A;  . THYROIDECTOMY N/A 06/03/2014   total - 1.5cm papillary thyroid carcinoma with negative surgical margins and 10/10 LN neg for mets; Armandina Gemma, MD  . WISDOM TOOTH  EXTRACTION     4     OB History    Gravida  1   Para      Term      Preterm      AB      Living        SAB      TAB      Ectopic      Multiple      Live Births              Family History  Problem Relation Age of Onset  . Cancer Mother 70       breast cancer, s/p mastectomy  . Diabetes Father   . Coronary artery disease Maternal Grandfather        several MIs  . Heart disease Maternal Grandfather   . Heart failure Maternal Grandfather   . Cancer Maternal Grandmother        breast cancer s/p lumpectomy  . Thyroid disease Maternal Grandmother   . Alcohol abuse Paternal Grandmother   . Stroke Neg Hx     Social History   Tobacco Use  . Smoking status: Never Smoker  . Smokeless tobacco: Never Used  Vaping Use  . Vaping Use: Never used  Substance Use Topics  . Alcohol use: Not Currently  . Drug use: No    Home Medications Prior to Admission medications   Medication Sig Start Date End Date Taking? Authorizing Provider  acetaminophen (TYLENOL) 500 MG tablet Take 500-1,000 mg by mouth every 6 (six)  hours as needed (headaches.).    [provider]  levothyroxine (SYNTHROID) 200 MCG tablet Take 1 tablet (200 mcg total) by mouth daily before breakfast. 07/09/19   Philemon Kingdom, MD  Prenatal Vit-Fe Fumarate-FA (MULTIVITAMIN-PRENATAL) 27-0.8 MG TABS tablet Take 1 tablet by mouth daily in the afternoon.    [provider]    Allergies    Patient has no known allergies.  Review of Systems   Review of Systems  All other systems reviewed and are negative.   Physical Exam Updated Vital Signs BP (!) 125/101 (BP Location: Right Arm)   Pulse 92   Temp 97.7 F (36.5 C) (Oral)   Resp 16   LMP 10/29/2018   SpO2 98%   Physical Exam Vitals and nursing note reviewed.   34 year old female, resting comfortably and in no acute distress. Vital signs are significant for rapid heart rate. Oxygen saturation is 96%, which is normal. Head is  normocephalic and atraumatic. PERRLA, EOMI. Oropharynx is clear. Neck is nontender and supple without adenopathy or JVD. Back is nontender and there is no CVA tenderness. Lungs are clear without rales, wheezes, or rhonchi. Chest is nontender. Heart is tachycardic without murmur. Abdomen is soft, flat, nontender without masses or hepatosplenomegaly and peristalsis is normoactive.  Gravid uterus is present and nontender. Extremities have 2+ edema, full range of motion is present. Skin is warm and dry without rash. Neurologic: Mental status is normal, cranial nerves are intact, there are no motor or sensory deficits.  ED Results / Procedures / Treatments    EKG EKG Interpretation  Date/Time:  Wednesday July 15 2019 02:58:57 EDT Ventricular Rate:  202 PR Interval:    QRS Duration: 88 QT Interval:  214 QTC Calculation: 392 R Axis:   64 Text Interpretation: Supraventricular tachycardia Septal infarct , age undetermined Abnormal ECG When compared with ECG of 05/03/2019, Supraventricular tachycardia has replaced Sinus rhythm Confirmed by Delora Fuel ([id number]) on 07/15/2019 3:34:56 AM   EKG Interpretation  Date/Time:  Wednesday July 15 2019 03:18:58 EDT Ventricular Rate:  107 PR Interval:    QRS Duration: 76 QT Interval:  327 QTC Calculation: 437 R Axis:   67 Text Interpretation: Sinus tachycardia Probable left atrial enlargement When compared with ECG of EARLIER SAME DATE Sinus tachycardia has replaced Paroxysmal supraventricular tachycardia Confirmed by Delora Fuel ([id number]) on 07/15/2019 3:41:57 AM       Procedures .Cardioversion  Date/Time: 07/15/2019 3:37 AM Performed by: Delora Fuel, MD Authorized by: Delora Fuel, MD   Consent:    Consent obtained:  Verbal   Consent given by:  Patient   Risks discussed:  Induced arrhythmia and death   Alternatives discussed:  No treatment Pre-procedure details:    Cardioversion basis:  Emergent   Rhythm:  Supraventricular  tachycardia Post-procedure details:    Patient status:  Awake   Patient tolerance of procedure:  Tolerated well, no immediate complications Comments:     She was given adenosine 6 mg intravenously with successful conversion to sinus rhythm.     Medications Ordered in ED Medications  adenosine (ADENOCARD) 6 MG/2ML injection 6 mg (6 mg Intravenous Given 07/15/19 0315)    ED Course  I have reviewed the triage vital signs and the nursing notes.  MDM Rules/Calculators/A&P Paroxysmal supraventricular tachycardia.  Old records reviewed, and she has had prior ED visits for PSVT.  It is also noted that she has been evaluated by cardiology who has recommended 38-month follow-up.  She is given a dose of  adenosine with successful conversion to sinus rhythm.  At this point, she is felt to be safe for transfer to maternal admissions unit.  OB rapid response nurse has been present and has been in touch with patient's obstetrician who feels comfortable excepting the patient for observation until her scheduled C-section later today.  Final Clinical Impression(s) / ED Diagnoses Final diagnoses:  PSVT (paroxysmal supraventricular tachycardia) ([place])  Third trimester pregnancy    Rx / DC Orders ED Discharge Orders    None       Delora Fuel, MD 33/29/51 [id number]    Delora Fuel, MD 66/06/30 [phone]

## 2019-07-15 NOTE — Op Note (Signed)
NAME: Lauren Morton, Lauren Morton MEDICAL RECORD [id number] ACCOUNT [id number] DATE OF BIRTH:[birth year] FACILITY: [facility] LOCATION: [place] II, MD  OPERATIVE REPORT  DATE OF PROCEDURE:  07/15/2019  PREOPERATIVE DIAGNOSES: 1.  Oligohydramnios. 2.  Breech.  POSTOPERATIVE DIAGNOSES: 1.  Oligohydramnios. 2.  Breech.  PROCEDURE:  Primary low transverse cesarean section.  SURGEON:  Everlene Farrier, MD   ASSISTANT:  Mylinda Latina, RNFA.  ANESTHESIA:  Spinal.  ESTIMATED BLOOD LOSS:  Per anesthesia note.  SPECIMENS:  Placenta to pathology.  FINDINGS:  Viable female infant.  Apgars, arterial cord pH, birth weight pending.  INDICATIONS AND CONSENT:  This patient is a 34 year old G1 P0 at 40 and 0/7th weeks.  Prenatal care has been complicated by oligohydramnios and breech position.  She has also had repeated bouts of supraventricular tachycardia as documented in the history  and physical.  Cesarean section was discussed preoperatively.  Potential risks and complications were discussed preoperatively, including but not limited to infection, organ damage, bleeding requiring transfusion of blood products with HIV and hepatitis  acquisition, DVT, PE, pneumonia.  The patient states she understands and agrees and consent was signed on the chart.  DESCRIPTION OF PROCEDURE:  The patient was taken to the operating room where she was identified.  Spinal anesthetic was placed per anesthesiology and she was placed in the dorsal supine position with a 15-degree left lateral wedge.  She was prepped  vaginally with Betadine.  Foley catheter was placed and prepped abdominally with ChloraPrep.  A timeout was undertaken.  After a 3-minute drying time, she was draped in a sterile fashion.  After testing for adequate spinal anesthesia, skin was entered  through a Pfannenstiel incision and dissection was carried out in layers to the peritoneum, which was taken down superiorly and inferiorly.   Bladder flap was developed and the bladder blade was placed.  Uterus was incised in a low transverse manner and  the uterine cavity was entered bluntly with a hemostat.  A small amount of clear fluid was noted.  Incision was extended with the fingers.  Baby was then delivered from the frank breech position without difficulty.  There was a nuchal cord x1.  Good cry  and tone was noted.  After 1 minute waiting time the cord was clamped and cut and the baby was handed to the waiting pediatrics team.  Placenta was manually delivered and sent to pathology.  Uterine cavity was clean.  Uterus was closed in 2 running  locking imbricating layers of 0 Monocryl suture, which achieved good hemostasis.  Tubes and ovaries were normal.  Lavage was carried out.  Anterior peritoneum was closed in a running fashion with 0 Monocryl suture, which was also used to reapproximate  the pyramidalis muscle in the midline.  A single 2-0 chromic figure-of-eight was also placed to obtain complete hemostasis on the rectus.  Lavage was again carried out.  The fascia was closed in a running fashion with a 0 looped PDS.  Subcutaneous layer  was closed with interrupted plain.  Skin was closed in a subcuticular fashion with a 4-0 Vicryl on a Keith needle.  Benzoin, Steri-Strips, honeycomb and pressure dressing are applied.  All counts were correct.  The patient was taken to recovery room in  stable condition.  VN/NUANCE  D:07/15/2019 T:07/15/2019 JOB:[id number]

## 2019-07-15 NOTE — Progress Notes (Signed)
Patient is 37weeks, G1P0, scheduled for primary C/S in the am for malpresentation. Patient currently complaining of SVT. States this has happened 3 times during pregnancy. Denies pain, contractions, bleeding, or leaking of fluids.   Dr. Gaetano Net notified of patients arrival to ED and requests 20 min NST.   Patient given Adenosine 6mg  @0316 , converted to NSR. Fetal monitoring: CAT I tracing.   Dr. Roxanne Mins (EDP) requested to watch patient 10 minutes and then transferred to [place] for scheduled C/S.   Dr. Gaetano Net called @ [phone] and requests cardiac monitoring / continuous fetal monitoring until scheduled procedure.   Fetal monitoring CAT I tracing.  Patient transferred to [place] @ 0351.

## 2019-07-15 NOTE — H&P (Signed)
Lauren Morton is a 34 y.o. female presenting for cesarean section. Pregnancy complicated by oligohydramnios. AFI = 6.8 and breech. Also has onset of PSVT this pregnancy. Treated x 2 previous episodes with adenosine in ED. Cardio evaluation done and plan is ablation after delivery. She had recurrence of SVT this am and was unable to break it at home. She presented to ED and was given adenosine 6mg  x 1>SR. Also Hx of Hashimoto's and S/P thyroidectomy. Thyroid replacement followed by endo. AMA > normal [place], girl. Placental venous [place] that resolved. OB History    Gravida  1   Para      Term      Preterm      AB      Living        SAB      TAB      Ectopic      Multiple      Live Births             Past Medical History:  Diagnosis Date  . CKD (chronic kidney disease) stage 1, GFR 90 ml/min or greater 09/2013   mild proteinuria Lauren Morton) sees yearly  . Dysrhythmia    SVT during pregnancy  . Hashimoto's thyroiditis    confirmed by biopsy s/p thyroidectomy  . Hypothyroidism, acquired, autoimmune   . Papillary thyroid carcinoma ([place]) 03/31/2014   s/p thyroidectomy, 10 negative LN   Past Surgical History:  Procedure Laterality Date  . LYMPH NODE DISSECTION N/A 06/03/2014   Procedure: LIMITED LYMPH NODE DISSECTION;  Surgeon: Armandina Gemma, MD;  Location: [facility] ORS;  Service: General;  Laterality: N/A;  . THYROIDECTOMY N/A 06/03/2014   total - 1.5cm papillary thyroid carcinoma with negative surgical margins and 10/10 LN neg for mets; Armandina Gemma, MD  . WISDOM TOOTH EXTRACTION     4   Family History: family history includes Alcohol abuse in her paternal grandmother; Cancer in her maternal grandmother; Cancer (age of onset: 41) in her mother; Coronary artery disease in her maternal grandfather; Diabetes in her father; Heart disease in her maternal grandfather; Heart failure in her maternal grandfather; Thyroid disease in her maternal grandmother. Social History:  reports that  she has never smoked. She has never used smokeless tobacco. She reports previous alcohol use. She reports that she does not use drugs.     Maternal Diabetes: No Genetic Screening: Normal Maternal Ultrasounds/Referrals: [place] resolved. Now oligohydramnios Fetal Ultrasounds or other Referrals:  None Maternal Substance Abuse:  No Significant Maternal Medications:  Meds include: Syntroid Significant Maternal Lab Results:  Group B Strep negative Other Comments:  None  Review of Systems  Constitutional: Negative for fever.  Cardiovascular: Negative for chest pain.  Gastrointestinal: Negative for abdominal pain.  Neurological: Negative for headaches.   Maternal Medical History:  Fetal activity: Perceived fetal activity is normal.        Blood pressure (!) 123/93, pulse (!) 101, temperature 98.4 F (36.9 C), temperature source Oral, resp. rate 16, height 5\' 11"  (1.803 m), weight 89.8 kg, last menstrual period 10/29/2018, SpO2 99 %. Maternal Exam:  Abdomen: Fetal presentation: breech     Fetal Exam Fetal State Assessment: Category I - tracings are normal.     Physical Exam Cardiovascular:     Comments: SR 90s Pulmonary:     Effort: Pulmonary effort is normal.     Prenatal labs: ABO, Rh: --/--/A POS (07/13 0844) Antibody: NEG (07/13 0844) Rubella:   RPR: NON REACTIVE (07/13 0844)  HBsAg:  HIV:    GBS:     Assessment/Plan: 34 yo G1P0 @ 37 0/7 with oligohydramnios and breech for cesarean section PSVT stable at present. She last ate @ 10:30 pm. Will plan on C/S 7:30. Continue EFM and maternal telemetry D/W C/S and risks including infection, organ damage, bleeding/transfusion-HIV/Hep, DVT/PE, pneumonia. Patient states she understands and agrees.   Shon Millet II 07/15/2019, 4:53 AM

## 2019-07-15 NOTE — ED Triage Notes (Addendum)
Pt reports she is SVT, has happen 3 times.  She is [redacted] weeks pregnant and is scheduled for a c-section this morning.  No pain or contractions at this time. She is alert and oriented, no pain at this time.

## 2019-07-15 NOTE — Progress Notes (Signed)
No changes to H&P per patient history Reviewed procedure-cesarean section All questions answered She states she understands and agrees

## 2019-07-15 NOTE — Progress Notes (Signed)
Pt ambulated in hallway and around nurses station, then back to room without difficulty.

## 2019-07-15 NOTE — Anesthesia Postprocedure Evaluation (Signed)
Anesthesia Post Note  Patient: Lauren Morton  Procedure(s) Performed: [place] (N/A Abdomen)     Patient location during evaluation: PACU Anesthesia Type: Spinal Level of consciousness: awake and alert and oriented Pain management: pain level controlled Vital Signs Assessment: post-procedure vital signs reviewed and stable Respiratory status: spontaneous breathing, nonlabored ventilation and respiratory function stable Cardiovascular status: blood pressure returned to baseline Postop Assessment: no apparent nausea or vomiting, spinal receding, no headache and no backache Anesthetic complications: no   No complications documented.  Last Vitals:  Vitals:   07/15/19 0930 07/15/19 0945  BP:  112/84  Pulse: 87 83  Resp: (!) 23 19  Temp: 36.7 C   SpO2: 100% 99%    Last Pain:  Vitals:   07/15/19 0945  TempSrc:   PainSc: 0-No pain   Pain Goal:    LLE Motor Response: Purposeful movement (07/15/19 0930)   RLE Motor Response: Purposeful movement (07/15/19 0930)       Epidural/Spinal Function Cutaneous sensation: Tingles (07/15/19 0945), Patient able to flex knees: Yes (07/15/19 0945), Patient able to lift hips off bed: No (07/15/19 0945), Back pain beyond tenderness at insertion site: No (07/15/19 0945), Progressively worsening motor and/or sensory loss: No (07/15/19 0945), Bowel and/or bladder incontinence post epidural: No (07/15/19 0945)  Brennan Bailey

## 2019-07-15 NOTE — ED Notes (Addendum)
Patient converted to NSR HR=95/min. after receiving Adenosine 6 mg IV , denies pain , respirations unlabored , IV site intact , no abdominal contractions at this time . Rapid response OB RN at bedside , Dr. Roxanne Mins advised RN that patient can be transferred to MAU after 10 mins. observation .

## 2019-07-16 LAB — CBC
HCT: 31.9 % — ABNORMAL LOW (ref 36.0–46.0)
Hemoglobin: 10.5 g/dL — ABNORMAL LOW (ref 12.0–15.0)
MCH: 30.1 pg (ref 26.0–34.0)
MCHC: 32.9 g/dL (ref 30.0–36.0)
MCV: 91.4 fL (ref 80.0–100.0)
Platelets: 187 10*3/uL (ref 150–400)
RBC: 3.49 MIL/uL — ABNORMAL LOW (ref 3.87–5.11)
RDW: 12.8 % (ref 11.5–15.5)
WBC: 12.1 10*3/uL — ABNORMAL HIGH (ref 4.0–10.5)
nRBC: 0 % (ref 0.0–0.2)

## 2019-07-16 LAB — SURGICAL PATHOLOGY

## 2019-07-16 NOTE — Progress Notes (Signed)
Subjective: Postpartum Day 1: Cesarean Delivery Patient reports tolerating PO.    Objective: Vital signs in last 24 hours: Temp:  [98 F (36.7 C)-98.8 F (37.1 C)] 98.3 F (36.8 C) (07/15 0527) Pulse Rate:  [76-98] 87 (07/15 0527) Resp:  [18-23] 18 (07/15 0035) BP: (102-119)/(63-84) 102/69 (07/15 0527) SpO2:  [95 %-100 %] 95 % (07/15 0527)  Physical Exam:  General: alert Lochia: appropriate Uterine Fundus: firm Incision: healing well DVT Evaluation: No evidence of DVT seen on physical exam.  Recent Labs    07/14/19 0844 07/16/19 0616  HGB 11.7* 10.5*  HCT 35.6* 31.9*    Assessment/Plan: Status post Cesarean section. Doing well postoperatively.  Continue current care. Hx PSVT - none since delivery.   Tyson Dense 07/16/2019, 7:28 AM

## 2019-07-16 NOTE — Lactation Note (Signed)
This note was copied from a baby's chart. Lactation Consultation Note Mom stated baby just finished feeding. Mom would like Lactation to come back today. Stated baby BF well, tugging on nipple some. Left brochure. Encouraged to call for assistance. STS during feedings encouraged.  Patient Name: Girl Skila Rollins [id number] Date: 07/16/2019     Maternal Data    Feeding Feeding Type: Breast Fed  LATCH Score                   Interventions    Lactation Tools Discussed/Used     Consult Status      Theodoro Kalata 07/16/2019, 2:52 AM

## 2019-07-16 NOTE — Lactation Note (Signed)
This note was copied from a baby's chart. Lactation Consultation Note  Patient Name: Lauren Morton [id number] Date: 07/16/2019 Reason for consult: Initial assessment;Primapara;1st time breastfeeding;Early term 37-38.6wks;Maternal endocrine disorder Type of Endocrine Disorder?: Thyroid  LC in to visit with P61 Mom of ET infant at 30 hrs old.  Baby at 3% weight loss.  Mom has been exclusively breastfeeding baby, latch scores of 8 with 8 feeds last 24 hrs.  Mom is asleep currently, and FOB states baby is latching and feeding well.  Encouraged FOB to call out for help prn.   Encouraged STS when Mom awake.  FOB nodded yes.  Interventions Interventions: Skin to skin  Lactation Tools Discussed/Used WIC Program: No   Consult Status Consult Status: Follow-up Date: 07/17/19 Follow-up type: In-patient    [name] 07/16/2019, 12:51 PM

## 2019-07-17 MED ORDER — IBUPROFEN 600 MG PO TABS: 600.0000 mg | ORAL_TABLET | Freq: Four times a day (QID) | ORAL | 0 refills | Status: DC | PRN

## 2019-07-17 MED ORDER — OXYCODONE HCL 5 MG PO TABS: 5.0000 mg | ORAL_TABLET | ORAL | 0 refills | Status: DC | PRN

## 2019-07-17 NOTE — Lactation Note (Signed)
This note was copied from a baby's chart. Lactation Consultation Note  Patient Name: Lauren Morton [id number] Date: 07/17/2019 Reason for consult: Follow-up assessment Type of Endocrine Disorder?: Thyroid   Mother is a P1  infant is 10 hours old ETI at 23 weeks weight 6-10  at 6% wt loss . Marland Kitchen  Mother reports that infant is feeding well. Mother reports that infant cluster fed during the night last night.   Reviewed hand expression with mother.and spoon feeding. Advised mother to spoon feed infant if unable to get her to sustain latch when she is pushing on and off.  Mother was given a harmony hand pump with instructions. .  Discussed treatment and prevention of engorgement.  Advised mother in ETI behaviors . Mother advised to rouse infant well with STS and wake for feedings if infant doesn't rouse on her own in 3-4 hours.   Mother to continue to cue base feed infant and feed at least 8-12 times or more in 24 hours and advised to allow for cluster feeding infant as needed.  Mother to continue to due STS. Mother is aware of available LC services at [facility], [facility], OP Dept, and phone # for questions or concerns about breastfeeding.  Mother receptive to all teaching and plan of care.     Maternal Data    Feeding Feeding Type: Breast Fed  LATCH Score                   Interventions Interventions: Hand pump  Lactation Tools Discussed/Used Pump Review: Setup, frequency, and cleaning;Milk Storage Initiated by:: Jess Barters RN,IBCLC Date initiated:: 07/17/19   Consult Status Consult Status: Complete    Jess Barters McCoy 07/17/2019, 10:20 AM

## 2019-07-17 NOTE — Discharge Summary (Signed)
Postpartum Discharge Summary  Date of Service updated7/16/21     Patient Name: Lauren Morton DOB: Mar 18, 1985 MRN: [id number]  Date of admission: 07/15/2019 Delivery date:07/15/2019  Delivering provider: Everlene Farrier  Date of discharge: 07/17/2019  Admitting diagnosis: PSVT (paroxysmal supraventricular tachycardia) ([place]) [I47.1] Breech presentation [O32.1XX0] Third trimester pregnancy [Z34.93] Intrauterine pregnancy: [redacted]w[redacted]d    Secondary diagnosis:  Active Problems:   Breech presentation  Additional problems: none    Discharge diagnosis: Term Pregnancy Delivered                                              Post partum procedures:  Augmentation: N/A Complications: None  Hospital course: Sceduled C/S   34y.o. yo G1P1001 at 329w0das admitted to the hospital 07/15/2019 for scheduled cesarean section with the following indication:Malpresentation.Delivery details are as follows:  Membrane Rupture Time/Date: 7:45 AM ,07/15/2019   Delivery Method:C-Section, Low Transverse  Details of operation can be found in separate operative note.  Patient had an uncomplicated postpartum course.  She is ambulating, tolerating a regular diet, passing flatus, and urinating well. Patient is discharged home in stable condition on  07/17/19        Newborn Data: Birth date:07/15/2019  Birth time:7:44 AM  Gender:Female  Living status:Living  Apgars:8 ,9  Weight:3010 g     Magnesium Sulfate received: No BMZ received: No Rhophylac:N/A MMR:No T-DaP:Given prenatally Flu: N/A Transfusion:No  Physical exam  Vitals:   07/16/19 0035 07/16/19 0527 07/16/19 2321 07/17/19 0522  BP: 119/76 102/69 117/83 (!) 118/92  Pulse: 86 87 84 85  Resp: 18     Temp: 98.6 F (37 C) 98.3 F (36.8 C) 97.9 F (36.6 C) 98 F (36.7 C)  TempSrc: Oral Oral Oral Oral  SpO2: 96% 95% 99% 96%  Weight:      Height:       General: alert, cooperative and no distress Lochia: appropriate Uterine Fundus:  firm Incision: Healing well with no significant drainage DVT Evaluation: No evidence of DVT seen on physical exam. Labs: Lab Results  Component Value Date   WBC 12.1 (H) 07/16/2019   HGB 10.5 (L) 07/16/2019   HCT 31.9 (L) 07/16/2019   MCV 91.4 07/16/2019   PLT 187 07/16/2019   CMP Latest Ref Rng & Units 05/03/2019  Glucose 70 - 99 mg/dL 135(H)  BUN 6 - 20 mg/dL 9  Creatinine 0.44 - 1.00 mg/dL 0.84  Sodium 135 - 145 mmol/L 139  Potassium 3.5 - 5.1 mmol/L 3.8  Chloride 98 - 111 mmol/L 109  CO2 22 - 32 mmol/L 20(L)  Calcium 8.9 - 10.3 mg/dL 8.7(L)  Total Protein 6.5 - 8.1 g/dL -  Total Bilirubin 0.3 - 1.2 mg/dL -  Alkaline Phos 38 - 126 U/L -  AST 15 - 41 U/L -  ALT 0 - 44 U/L -   Edinburgh Score: Edinburgh Postnatal Depression Scale Screening Tool 07/16/2019  I have been able to laugh and see the funny side of things. 0  I have looked forward with enjoyment to things. 0  I have blamed myself unnecessarily when things went wrong. 1  I have been anxious or worried for no good reason. 0  I have felt scared or panicky for no good reason. 0  Things have been getting on top of me. 0  I have been so unhappy that  I have had difficulty sleeping. 0  I have felt sad or miserable. 0  I have been so unhappy that I have been crying. 0  The thought of harming myself has occurred to me. 0  Edinburgh Postnatal Depression Scale Total 1      After visit meds:  Allergies as of 07/17/2019   No Known Allergies     Medication List    TAKE these medications   acetaminophen 500 MG tablet Commonly known as: TYLENOL Take 500-1,000 mg by mouth every 6 (six) hours as needed (headaches.).   ibuprofen 600 MG tablet Commonly known as: ADVIL Take 1 tablet (600 mg total) by mouth every 6 (six) hours as needed for moderate pain.   levothyroxine 200 MCG tablet Commonly known as: Synthroid Take 1 tablet (200 mcg total) by mouth daily before breakfast.   multivitamin-prenatal 27-0.8 MG Tabs  tablet Take 1 tablet by mouth daily in the afternoon.   oxyCODONE 5 MG immediate release tablet Commonly known as: Oxy IR/ROXICODONE Take 1-2 tablets (5-10 mg total) by mouth every 4 (four) hours as needed for moderate pain.        Discharge home in stable condition Infant Feeding: Breast Infant Disposition:home with mother Discharge instruction: per After Visit Summary and Postpartum booklet. Activity: Advance as tolerated. Pelvic rest for 6 weeks.  Diet: routine diet Anticipated Birth Control: Unsure Postpartum Appointment:6 weeks Additional Postpartum F/U:   Future Appointments: Future Appointments  Date Time Provider [place]  09/16/2019 11:20 AM Philemon Kingdom, MD [id number] None   Follow up Visit:      07/17/2019 Luz Lex, MD

## 2019-07-30 DIAGNOSIS — M5430 Sciatica, unspecified side: Secondary | ICD-10-CM | POA: Diagnosis not present

## 2019-09-16 ENCOUNTER — Ambulatory Visit (INDEPENDENT_AMBULATORY_CARE_PROVIDER_SITE_OTHER): Payer: BC Managed Care – PPO | Admitting: Internal Medicine

## 2019-09-16 ENCOUNTER — Encounter: Payer: Self-pay | Admitting: Internal Medicine

## 2019-09-16 ENCOUNTER — Other Ambulatory Visit: Payer: Self-pay

## 2019-09-16 VITALS — BP 124/88 | HR 86 | Ht 71.0 in | Wt 178.0 lb

## 2019-09-16 DIAGNOSIS — E890 Postprocedural hypothyroidism: Secondary | ICD-10-CM | POA: Diagnosis not present

## 2019-09-16 DIAGNOSIS — C73 Malignant neoplasm of thyroid gland: Secondary | ICD-10-CM

## 2019-09-16 LAB — TSH: TSH: 0.01 u[IU]/mL — ABNORMAL LOW (ref 0.35–4.50)

## 2019-09-16 LAB — T4, FREE: Free T4: 1.66 ng/dL — ABNORMAL HIGH (ref 0.60–1.60)

## 2019-09-16 NOTE — Progress Notes (Addendum)
Patient ID: Lauren Morton, female   DOB: July 26, 1985, 34 y.o.   MRN: [id number]  This visit occurred during the SARS-CoV-2 public health emergency.  Safety protocols were in place, including screening questions prior to the visit, additional usage of staff PPE, and extensive cleaning of exam room while observing appropriate contact time as indicated for disinfecting solutions.   HPI  Lauren Morton is a 34 y.o.-year-old female, returning for f/u for papillary thyroid cancer and postsurgical hypothyroidism. Last visit 3 months ago.  Since last visit, she gave birth by C-section on 07/15/2019. She has a healthy daughter  [name]).  She had SVT episodes repeatedly during this pregnancy, lasting the third trimester. 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 She had episodes of palpitations since giving birth.  Reviewed her thyroid cancer history: Pt. has been dx with goiter in ~2006 >> thyroid U/S: ? Results but also dx'ed with Hashimoto's thyroiditis with hypothyroidism >> started levothyroxine.  She started to see Dr Danise Mina 03/2014 >> new U/S (04/2014): small R thyroid nodule 1.4 x 1.3 x 1.1 cm, with microcalcifications >>  PTC in 04/2014, by FNA.   - 06/03/2014: Total thyroidectomy -Dr. Harlow Asa. Path: Diagnosis 1. Thyroid, thyroidectomy, total thyroid, suture right superior pole - PAPILLARY THYROID CARCINOMA, 1.5 CM IN GREATEST DIMENSION. - MARGINS ARE NEGATIVE. - BACKGROUND FLORID LYMPHOCYTIC THYROIDITIS. - SEE ONCOLOGY TEMPLATE. 2. Lymph nodes, regional resection, central compartment - TEN BENIGN LYMPH NODES WITH NO TUMOR SEEN (0/10). Microscopic Comment 1. THYROID Specimen: Thyroid with central compartment lymph nodes. Procedure: Total thyroidectomy with limited central compartment lymph dissection. Specimen Integrity (intact/fragmented): Intact. Tumor focality: Unifocal. Dominant tumor: Maximum tumor size (cm): 1.5 cm. Tumor laterality: Tumor  involves right superior pole. Histologic type (including subtype and/or unique features as applicable): Papillary thyroid carcinoma, conventional type. Tumor capsule: Tumor is partially encapsulated. Extrathyroidal extension: No. Margins: Negative. Lymph - Vascular invasion: Definitive lymph/vascular is not identified. Capsular invasion with degree of invasion if present: No capsular invasion identified. Lymph nodes: # examined 10; # positive 0. TNM code: pT1b, pN0. Non-neoplastic thyroid: [place] lymphocytic thyroiditis. (RH:ds 06/04/14)  Neck U/S (12/28/2018): Isthmus: Surgically absent. There is no residual nodular soft tissue within the isthmic resection bed.  Right lobe: Surgically absent. There is fairly well-defined hypoechoic nodular soft tissue measuring approximately 0.5 x 0.4 x 1.0 cm within the right thyroid lobectomy resection bed.  Left lobe: Surgically absent. There is no residual nodular soft tissue within the left lobectomy resection bed.  Benign appearing non pathologically enlarged cervical lymph nodes are seen bilaterally.  Neck U/S (01/03/2018): Redemonstration surgical changes of thyroidectomy. No residual   thyroid tissue identified within the left or right thyroid bed.  Her thyroglobulin levels remain detectable since she did not have RAI treatment (her tumor was only 1.5 cm in size): Lab Results  Component Value Date   THYROGLB 0.3 (L) 06/05/2018   THYROGLB 0.2 (L) 12/12/2017   THYROGLB 0.4 (L) 03/01/2017   THYROGLB 0.3 (L) 01/25/2016   THYROGLB 0.8 (L) 02/18/2015   THYROGLB 0.8 (L) 08/18/2014   ATA antibodies are undetectable: Lab Results  Component Value Date   THGAB <1 06/05/2018   THGAB <1 12/12/2017   THGAB <1 03/01/2017   THGAB <1 01/25/2016   THGAB <1 02/18/2015   THGAB 1 08/18/2014   12/2015: Neck U/S 1 year after thyroidectomy:  Post total thyroidectomy with minimal amount (approximately 1 cm) of residual nodular tissue within the  right thyroid lobectomy resection bed potentially  indicative of residual thyroid tissue though residual/locally recurrent disease is not excluded on the basis this examination. This residual tissue could be amenable to ultrasound-guided fine-needle aspiration as clinically indicated.  Postsurgical hypothyroidism:  In 12/2017, she was supposed to be on levothyroxine 125 mcg daily, however, the pharmacy refilled the previous prescription for 175 mcg daily and she was taking this for the months prior to our last visit.  Subsequently, TSH was suppressed.  We decreased the dose to 125 mcg daily and her TFTs normalized in 01/2018.  However, then, TSH was suppressed and we decreased the dose of her levothyroxine.  She did not return for repeat labs 1.5 months later...   In 12/2018, her TSH was normal, but above target for pregnancy so we increased her levothyroxine dose to 9 tablets of 112 mcg/week.  However, since then, she had several TSH levels that were much higher, unexplained.  I was not aware of that was done, as she had them checked when she presented to the ED with tachycardia.  On 05/04/2019, TSH was still high at 15.9.  TFTs remained uncontrolled afterwards.  She admitted for missed levothyroxine doses. We had to increase the dose up to 200 mcg daily (07/08/2019).  Patient takes the levothyroxine: - in am - fasting - at least 30 min from b'fast, 45 min from coffee + cream - no Ca, Fe, + MVI (prenatals) at night - no PPIs - not on Biotin  We reviewed her TFTs: Lab Results  Component Value Date   TSH 8.64 (H) 07/08/2019   TSH 4.93 (H) 06/08/2019   TSH 15.91 (H) 05/04/2019   TSH 33.625 (H) 05/03/2019   TSH 14.071 (H) 03/28/2019   TSH 0.19 (L) 03/02/2019   TSH 5.22 (H) 01/07/2019   TSH 3.72 12/02/2018   TSH 0.11 (L) 06/05/2018   TSH 0.40 01/15/2018   FREET4 0.81 07/08/2019   FREET4 0.85 06/08/2019   FREET4 0.75 05/04/2019   FREET4 0.69 05/03/2019   FREET4 0.66 03/28/2019   FREET4  1.53 03/02/2019   FREET4 0.93 01/07/2019   FREET4 1.19 12/02/2018   FREET4 1.36 06/05/2018   FREET4 1.26 01/15/2018  01/10/2017: TSH 0.03 at OB/GYN office -we decreased the dose of levothyroxine at that time.  Pt denies: - feeling nodules in neck - hoarseness - dysphagia - choking - SOB with lying down  She has + FH of thyroid disorders in: MGM - Hashimoto's ds. No FH of thyroid cancer. No h/o radiation tx to head or neck.  No herbal supplements. No Biotin use. No recent steroids use.   ROS: Constitutional: no weight gain/+ weight loss, no fatigue, no subjective hyperthermia, no subjective hypothermia Eyes: no blurry vision, no xerophthalmia ENT: no sore throat, + see HPI Cardiovascular: no CP/no SOB/+ palpitations/no leg swelling Respiratory: no cough/no SOB/no wheezing Gastrointestinal: no N/no V/no D/no C/no acid reflux Musculoskeletal: no muscle aches/no joint aches Skin: no rashes, no hair loss Neurological: no tremors/no numbness/no tingling/no dizziness  I reviewed pt's medications, allergies, PMH, social hx, family hx, and changes were documented in the history of present illness. Otherwise, unchanged from my initial visit note.  Past Medical History:  Diagnosis Date  . CKD (chronic kidney disease) stage 1, GFR 90 ml/min or greater 09/2013   mild proteinuria [facility]) sees yearly  . Dysrhythmia    SVT during pregnancy  . Hashimoto's thyroiditis    confirmed by biopsy s/p thyroidectomy  . Hypothyroidism, acquired, autoimmune   . Papillary thyroid carcinoma ([place]) 03/31/2014  s/p thyroidectomy, 10 negative LN   Past Surgical History:  Procedure Laterality Date  . CESAREAN SECTION N/A 07/15/2019   Procedure: NTIRWER CESAREAN SECTION;  Surgeon: Everlene Farrier, MD;  Location: [facility];  Service: Obstetrics;  Laterality: N/A;  . LYMPH NODE DISSECTION N/A 06/03/2014   Procedure: LIMITED LYMPH NODE DISSECTION;  Surgeon: Armandina Gemma, MD;  Location: [facility];  Service:  General;  Laterality: N/A;  . THYROIDECTOMY N/A 06/03/2014   total - 1.5cm papillary thyroid carcinoma with negative surgical margins and 10/10 LN neg for mets; Armandina Gemma, MD  . WISDOM TOOTH EXTRACTION     4   Social History   Social History  . Marital Status: Married    Spouse Name: N/A  . Number of Children: 0   Occupational History  . School counselor   Social History Main Topics  . Smoking status: Never Smoker   . Smokeless tobacco: Never Used  . Alcohol Use: Yes     Comment: social use  . Drug Use: No   Social History Narrative   Caffeine: 1 soda at lunch   Lives with husband, 2 dogs and 1 pig, chickens   Occupation: Animal nutritionist   Edu: Masters in education   Activity: runs, has done 1/2 marathons   Diet: good water, good fruits/vegetables, red meat 2x/wk, fish 1x/wk   Current Outpatient Medications on File Prior to Visit  Medication Sig Dispense Refill  . acetaminophen (TYLENOL) 500 MG tablet Take 500-1,000 mg by mouth every 6 (six) hours as needed (headaches.).    Marland Kitchen ibuprofen (ADVIL) 600 MG tablet Take 1 tablet (600 mg total) by mouth every 6 (six) hours as needed for moderate pain. 30 tablet 0  . levothyroxine (SYNTHROID) 200 MCG tablet Take 1 tablet (200 mcg total) by mouth daily before breakfast. 45 tablet 3  . oxyCODONE (OXY IR/ROXICODONE) 5 MG immediate release tablet Take 1-2 tablets (5-10 mg total) by mouth every 4 (four) hours as needed for moderate pain. 30 tablet 0  . Prenatal Vit-Fe Fumarate-FA (MULTIVITAMIN-PRENATAL) 27-0.8 MG TABS tablet Take 1 tablet by mouth daily in the afternoon.     No current facility-administered medications on file prior to visit.   No Known Allergies Family History  Problem Relation Age of Onset  . Cancer Mother 49       breast cancer, s/p mastectomy  . Diabetes Father   . Coronary artery disease Maternal Grandfather        several MIs  . Heart disease Maternal Grandfather   . Heart failure Maternal Grandfather   .  Cancer Maternal Grandmother        breast cancer s/p lumpectomy  . Thyroid disease Maternal Grandmother   . Alcohol abuse Paternal Grandmother   . Stroke Neg Hx    PE: BP 124/88   Pulse 86   Ht $R'5\' 11"'Iz$  (1.803 m)   Wt 178 lb (80.7 kg)   LMP 10/29/2018   SpO2 96%   BMI 24.83 kg/m  Body mass index is 24.83 kg/m. Wt Readings from Last 3 Encounters:  09/16/19 178 lb (80.7 kg)  07/15/19 198 lb (89.8 kg)  07/13/19 198 lb (89.8 kg)   Constitutional: overweight, in NAD Eyes: PERRLA, EOMI, no exophthalmos ENT: moist mucous membranes, no neck masses palpable, no cervical lymphadenopathy Cardiovascular: RRR, No MRG Respiratory: CTA B Gastrointestinal: abdomen soft, NT, ND, BS+ Musculoskeletal: no deformities, strength intact in all 4 Skin: moist, warm, no rashes Neurological: no tremor with outstretched hands, DTR normal  in all 4  ASSESSMENT: 1.  Papillary thyroid cancer (PTC) - see HPI  2. Postsurgical Hypothyroidism  PLAN:  1.  Papillary thyroid cancer -Patient with a 1.5 cm PTC cancer focus, for which she had total thyroidectomy but no RAI treatment due to tumor size and low risk status.  We continue to follow her with thyroglobulin levels and neck ultrasound.  Reviewed the report of her neck ultrasound from 12/2015.  This showed a 1 cm right neck mass in the thyroid region.  However, she had another neck ultrasound in 01/2018 and this did not show any masses in her neck.  It was likely that the previous seen 1 cm mass was just an area of inflammation. -Her latest thyroglobulin level was detectable (expected, and she did not have RAI treatment) but lower than before and it continues to increase.  ATA antibodies were undetectable. -We will repeat the thyroglobulin and ATA antibodies now  2. Patient with history of total thyroidectomy, on levothyroxine therapy, with uncontrolled hypothyroidism, now pregnant -She had uncontrolled hypothyroidism during the pregnancy, as she was missing  LT4  doses.  At that time, we discussed that it was critical for the healthy development of the fetus and also for pregnancy itself to take the medication consistently.   - latest thyroid labs reviewed with pt >> still elevated, after which we increased the dose of levothyroxine to 200 mcg daily. Lab Results  Component Value Date   TSH 8.64 (H) 07/08/2019   - she continues on LT4 200 mcg daily, we discussed that we most likely previously noted now that she is postpartum.  I actually advised her to hold the dose for tomorrow until we get the results of her labs back - pt feels good on this dose, but still had palpitations even after pregnancy. She lost 20 lbs in last 2 mo  - after giving birth. No tremors, heat intolerance. - we discussed about taking the thyroid hormone every day, with water, >30 minutes before breakfast, separated by >4 hours from acid reflux medications, calcium, iron, multivitamins. Pt. is taking it correctly. - will check thyroid tests today: TSH and fT4 - If labs are abnormal, she will need to return for repeat TFTs in 1.5 months  Component     Latest Ref Rng & Units 09/16/2019  [name]     ng/mL <0.1 (L)  Comment        T4,Free(Direct)     0.60 - 1.60 ng/dL 1.66 (H)  TSH     0.35 - 4.50 uIU/mL 0.01 (L)  [name] Ab     < or = 1 IU/mL 1   Tg undetectable. TSH suppressed, as expected.  We will decrease the dose of levothyroxine to 150 mcg daily and repeat the tests in 1.5 months.  Philemon Kingdom, MD PhD [facility]

## 2019-09-16 NOTE — Patient Instructions (Signed)
Please continue the Levothyroxine 200 mcg daily.  Take the thyroid hormone every day, with water, at least 30 minutes before breakfast, separated by at least 4 hours from: - acid reflux medications - calcium - iron - multivitamins  Please stop at the lab.  Please come back for a follow-up appointment in 6 months.

## 2019-09-17 ENCOUNTER — Encounter: Payer: Self-pay | Admitting: Internal Medicine

## 2019-09-17 LAB — THYROGLOBULIN LEVEL: Thyroglobulin: <0.1 ng/mL — ABNORMAL LOW

## 2019-09-17 LAB — THYROGLOBULIN ANTIBODY: Thyroglobulin Ab: 1 IU/mL (ref <=1)

## 2019-09-17 MED ORDER — LEVOTHYROXINE SODIUM 150 MCG PO TABS: 150.0000 ug | ORAL_TABLET | Freq: Every day | ORAL | 3 refills | Status: DC

## 2019-09-18 MED ORDER — LEVOTHYROXINE SODIUM 150 MCG PO TABS: 150.0000 ug | ORAL_TABLET | Freq: Every day | ORAL | 3 refills | Status: DC

## 2019-09-21 DIAGNOSIS — N181 Chronic kidney disease, stage 1: Secondary | ICD-10-CM | POA: Insufficient documentation

## 2019-11-09 ENCOUNTER — Other Ambulatory Visit: Payer: Self-pay | Admitting: Internal Medicine

## 2019-11-29 NOTE — Progress Notes (Deleted)
Cardiology Office Note:   Date:  11/29/2019  NAME:  Lauren Morton    MRN: [id number] DOB:  [birth year]   PCP:  Ria Bush, MD  Cardiologist:  No primary care provider on file.  Electrophysiologist:  None   Referring MD: Ria Bush, MD   No chief complaint on file. ***  History of Present Illness:   Lauren Morton is a 34 y.o. female with a hx of SVT, hypothyroidism who presents for follow-up. She was seen earlier this year in her third trimester for SVT. Had AVNRT. Thyroid hormone all over the place and is now hyperthyroid.   Problem List 1. 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 Papillary thyroid CA -s/p thyroidectomy with hypothyroidism   Past Medical History: Past Medical History:  Diagnosis Date  . CKD (chronic kidney disease) stage 1, GFR 90 ml/min or greater 09/2013   mild proteinuria [facility]) sees yearly  . Dysrhythmia    SVT during pregnancy  . Hashimoto's thyroiditis    confirmed by biopsy s/p thyroidectomy  . Hypothyroidism, acquired, autoimmune   . Papillary thyroid carcinoma ([place]) 03/31/2014   s/p thyroidectomy, 10 negative LN    Past Surgical History: Past Surgical History:  Procedure Laterality Date  . CESAREAN SECTION N/A 07/15/2019   Procedure: [id number] CESAREAN SECTION;  Surgeon: Everlene Farrier, MD;  Location: [facility];  Service: Obstetrics;  Laterality: N/A;  . LYMPH NODE DISSECTION N/A 06/03/2014   Procedure: LIMITED LYMPH NODE DISSECTION;  Surgeon: Armandina Gemma, MD;  Location: [facility];  Service: General;  Laterality: N/A;  . THYROIDECTOMY N/A 06/03/2014   total - 1.5cm papillary thyroid carcinoma with negative surgical margins and 10/10 LN neg for mets; Armandina Gemma, MD  . WISDOM TOOTH EXTRACTION     4    Current Medications: No outpatient medications have been marked as taking for the 12/01/19 encounter (Appointment) with O'Neal, Cassie Freer, MD.     Allergies:    Patient has no known allergies.    Social History: Social History   Socioeconomic History  . Marital status: Married    Spouse name: Not on file  . Number of children: 1  . Years of education: Not on file  . Highest education level: Not on file  Occupational History  . Not on file  Tobacco Use  . Smoking status: Never Smoker  . Smokeless tobacco: Never Used  Vaping Use  . Vaping Use: Never used  Substance and Sexual Activity  . Alcohol use: Not Currently  . Drug use: No  . Sexual activity: Not on file  Other Topics Concern  . Not on file  Social History Narrative   Caffeine: 1 soda at lunch   Lives with husband, 2 dogs and 1 pig, chickens   Occupation: school counselor   Edu: Masters in education   Activity: runs, has done 1/2 marathons   Diet: good water, good fruits/vegetables, red meat 2x/wk, fish 1x/wk   Social Determinants of Health   Financial Resource Strain:   . Difficulty of Paying Living Expenses: Not on file  Food Insecurity:   . Worried About Charity fundraiser in the Last Year: Not on file  . Ran Out of Food in the Last Year: Not on file  Transportation Needs:   . Lack of Transportation (Medical): Not on file  . Lack of Transportation (Non-Medical): Not on file  Physical Activity:   . Days of Exercise per Week: Not on file  . Minutes of Exercise  per Session: Not on file  Stress:   . Feeling of Stress : Not on file  Social Connections:   . Frequency of Communication with Friends and Family: Not on file  . Frequency of Social Gatherings with Friends and Family: Not on file  . Attends Religious Services: Not on file  . Active Member of Clubs or Organizations: Not on file  . Attends Archivist Meetings: Not on file  . Marital Status: Not on file     Family History: The patient's ***family history includes Alcohol abuse in her paternal grandmother; Cancer in her maternal grandmother; Cancer (age of onset: 39) in her mother; Coronary artery disease in her maternal  grandfather; Diabetes in her father; Heart disease in her maternal grandfather; Heart failure in her maternal grandfather; Thyroid disease in her maternal grandmother. There is no history of Stroke.  ROS:   All other ROS reviewed and negative. Pertinent positives noted in the HPI.     EKGs/Labs/Other Studies Reviewed:   The following studies were personally reviewed by me today:  EKG:  EKG is *** ordered today.  The ekg ordered today demonstrates ***, and was personally reviewed by me.   Recent Labs: 03/28/2019: ALT 30; B Natriuretic Peptide 516.1; Magnesium 1.6 05/03/2019: BUN 9; Creatinine, Ser 0.84; Potassium 3.8; Sodium 139 07/16/2019: Hemoglobin 10.5; Platelets 187 09/16/2019: TSH 0.01   Recent Lipid Panel    Component Value Date/Time   CHOL 188 03/31/2014 1227   TRIG 70.0 03/31/2014 1227   HDL 69.90 03/31/2014 1227   CHOLHDL 3 03/31/2014 1227   VLDL 14.0 03/31/2014 1227   LDLCALC 104 (H) 03/31/2014 1227    Physical Exam:   VS:  There were no vitals taken for this visit.   Wt Readings from Last 3 Encounters:  09/16/19 178 lb (80.7 kg)  07/15/19 198 lb (89.8 kg)  07/13/19 198 lb (89.8 kg)    General: Well nourished, well developed, in no acute distress Heart: Atraumatic, normal size  Eyes: PEERLA, EOMI  Neck: Supple, no JVD Endocrine: No thryomegaly Cardiac: Normal S1, S2; RRR; no murmurs, rubs, or gallops Lungs: Clear to auscultation bilaterally, no wheezing, rhonchi or rales  Abd: Soft, nontender, no hepatomegaly  Ext: No edema, pulses 2+ Musculoskeletal: No deformities, BUE and BLE strength normal and equal Skin: Warm and dry, no rashes   Neuro: Alert and oriented to person, place, time, and situation, CNII-XII grossly intact, no focal deficits  Psych: Normal mood and affect   ASSESSMENT:   Lauren Morton is a 34 y.o. female who presents for the following: No diagnosis found.  PLAN:   There are no diagnoses linked to this encounter.  Disposition: No  follow-ups on file.  Medication Adjustments/Labs and Tests Ordered: Current medicines are reviewed at length with the patient today.  Concerns regarding medicines are outlined above.  No orders of the defined types were placed in this encounter.  No orders of the defined types were placed in this encounter.   There are no Patient Instructions on file for this visit.   Time Spent with Patient: I have spent a total of *** minutes with patient reviewing hospital notes, telemetry, EKGs, labs and examining the patient as well as establishing an assessment and plan that was discussed with the patient.  > 50% of time was spent in direct patient care.  Signed, Addison Naegeli. Audie Box, [place]  [address], [place],  [address] [phone]  11/29/2019 7:23  PM

## 2019-12-01 ENCOUNTER — Ambulatory Visit: Payer: BC Managed Care – PPO | Admitting: Cardiovascular Disease

## 2019-12-08 NOTE — Progress Notes (Signed)
Cardiology Office Note:   Date:  12/10/2019  NAME:  Lauren Morton    MRN: [id number] DOB:  [birth year]   PCP:  Ria Bush, MD  Cardiologist:  No primary care provider on file.   Referring MD: Ria Bush, MD   Chief Complaint  Patient presents with  . Follow-up        History of Present Illness:   Lauren Morton is a 34 y.o. female with a hx of SVT who presents for follow-up. Seen in May for AVNRT. She was pregnant and close to delivery. No treatment recommended. Delivery 07/15/2019. Labs shows she is overtly hyperthyroid on supplementation. She reports she is doing well since her delivery.  She apparently had 1 more episode of SVT during delivery.  She reports currently she is having 1 episode per month.  She describes monthly episodes that last several seconds.  She reports has had to have one episode where she had to bear down to resolve the episode.  She otherwise is doing well.  She had a baby girl.  No chest pain or shortness of breath.  EKG shows normal sinus rhythm with sinus arrhythmia.  The only concerning thing on lab work is her hyperthyroid state.  She does have plans to have this repeated by her primary care physician.  She reports that she wishes to have subsequent pregnancies.  She is interested in ablation and would like to discuss this further with electrophysiology.  We also discussed watchful waiting.  Given that she desires subsequent pregnancies she does not want to deal with this anymore.  I think it is reasonable for her to be evaluated for an SVT ablation.  Problem List 1. 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 Thyroid CA s/p thyroidectomy with Hypothyroidism   Past Medical History: Past Medical History:  Diagnosis Date  . CKD (chronic kidney disease) stage 1, GFR 90 ml/min or greater 09/2013   mild proteinuria [facility]) sees yearly  . Dysrhythmia    SVT during pregnancy  . Hashimoto's thyroiditis    confirmed by  biopsy s/p thyroidectomy  . Hypothyroidism, acquired, autoimmune   . Papillary thyroid carcinoma ([place]) 03/31/2014   s/p thyroidectomy, 10 negative LN    Past Surgical History: Past Surgical History:  Procedure Laterality Date  . CESAREAN SECTION N/A 07/15/2019   Procedure: KGYJEHU CESAREAN SECTION;  Surgeon: Everlene Farrier, MD;  Location: [facility];  Service: Obstetrics;  Laterality: N/A;  . LYMPH NODE DISSECTION N/A 06/03/2014   Procedure: LIMITED LYMPH NODE DISSECTION;  Surgeon: Armandina Gemma, MD;  Location: [facility];  Service: General;  Laterality: N/A;  . THYROIDECTOMY N/A 06/03/2014   total - 1.5cm papillary thyroid carcinoma with negative surgical margins and 10/10 LN neg for mets; Armandina Gemma, MD  . WISDOM TOOTH EXTRACTION     4    Current Medications: Current Meds  Medication Sig  . levothyroxine (SYNTHROID) 150 MCG tablet Take 1 tablet (150 mcg total) by mouth daily before breakfast.  . Prenatal Vit-Fe Fumarate-FA (MULTIVITAMIN-PRENATAL) 27-0.8 MG TABS tablet Take 1 tablet by mouth daily in the afternoon.     Allergies:    Patient has no known allergies.   Social History: Social History   Socioeconomic History  . Marital status: Married    Spouse name: Not on file  . Number of children: 1  . Years of education: Not on file  . Highest education level: Not on file  Occupational History  . Not on file  Tobacco Use  . Smoking status: Never Smoker  . Smokeless tobacco: Never Used  Vaping Use  . Vaping Use: Never used  Substance and Sexual Activity  . Alcohol use: Not Currently  . Drug use: No  . Sexual activity: Not on file  Other Topics Concern  . Not on file  Social History Narrative   Caffeine: 1 soda at lunch   Lives with husband, 2 dogs and 1 pig, chickens   Occupation: school counselor   Edu: Masters in education   Activity: runs, has done 1/2 marathons   Diet: good water, good fruits/vegetables, red meat 2x/wk, fish 1x/wk   Social Determinants of Adult nurse Strain: Not on file  Food Insecurity: Not on file  Transportation Needs: Not on file  Physical Activity: Not on file  Stress: Not on file  Social Connections: Not on file     Family History: The patient's family history includes Alcohol abuse in her paternal grandmother; Cancer in her maternal grandmother; Cancer (age of onset: 80) in her mother; Coronary artery disease in her maternal grandfather; Diabetes in her father; Heart disease in her maternal grandfather; Heart failure in her maternal grandfather; Thyroid disease in her maternal grandmother. There is no history of Stroke.  ROS:   All other ROS reviewed and negative. Pertinent positives noted in the HPI.     EKGs/Labs/Other Studies Reviewed:   The following studies were personally reviewed by me today:  EKG:  EKG is ordered today.  The ekg ordered today demonstrates normal sinus rhythm, heart rate 66, no ischemic changes, no evidence of infarction allergy to help, and was personally reviewed by me.   Recent Labs: 03/28/2019: ALT 30; B Natriuretic Peptide 516.1; Magnesium 1.6 05/03/2019: BUN 9; Creatinine, Ser 0.84; Potassium 3.8; Sodium 139 07/16/2019: Hemoglobin 10.5; Platelets 187 09/16/2019: TSH 0.01   Recent Lipid Panel    Component Value Date/Time   CHOL 188 03/31/2014 1227   TRIG 70.0 03/31/2014 1227   HDL 69.90 03/31/2014 1227   CHOLHDL 3 03/31/2014 1227   VLDL 14.0 03/31/2014 1227   LDLCALC 104 (H) 03/31/2014 1227    Physical Exam:   VS:  BP 115/75   Pulse 66   Ht 5\' 11"  (1.803 m)   Wt 187 lb 12.8 oz (85.2 kg)   SpO2 95%   BMI 26.19 kg/m    Wt Readings from Last 3 Encounters:  12/10/19 187 lb 12.8 oz (85.2 kg)  09/16/19 178 lb (80.7 kg)  07/15/19 198 lb (89.8 kg)    General: Well nourished, well developed, in no acute distress Head: Atraumatic, normal size  Eyes: PEERLA, EOMI  Neck: Supple, no JVD Endocrine: No thryomegaly Cardiac: Normal S1, S2; RRR; no murmurs, rubs, or  gallops Lungs: Clear to auscultation bilaterally, no wheezing, rhonchi or rales  Abd: Soft, nontender, no hepatomegaly  Ext: No edema, pulses 2+ Musculoskeletal: No deformities, BUE and BLE strength normal and equal Skin: Warm and dry, no rashes   Neuro: Alert and oriented to person, place, time, and situation, CNII-XII grossly intact, no focal deficits  Psych: Normal mood and affect   ASSESSMENT:   Kristy Schomburg is a 34 y.o. female who presents for the following: 1. AVNRT (AV nodal re-entry tachycardia) ([place])   2. Palpitations     PLAN:   1. AVNRT (AV nodal re-entry tachycardia) ([place]) 2. Palpitations -03/28/2019, 05/03/2019  -responded to adenosine x2  -She had at least 2 episodes of documented SVT.  Appear to  be an AV nodal reentry tachycardia.  This was when she was pregnant in March and May of this year.  She also had a subsequent episode during delivery.  She is manage this conservatively.  Since delivery she has had monthly episodes of palpitations.  She is only had one episode where she had to bear down and pursue breathing exercises to resolve the rapid heartbeat sensation.  Currently she is hyperthyroid.  Her endocrinologist has reduced the dose of her Synthroid.  I think this is clouding the picture.  She does report that she would like to pursue subsequent pregnancies when not like to have this happen again.  I therefore think it is reasonable for her to be evaluated by electrophysiology to determine if she is a good candidate for ablation.  We will set her up to see Dr. Lars Mage for evaluation.  Disposition: Return if symptoms worsen or fail to improve.  Medication Adjustments/Labs and Tests Ordered: Current medicines are reviewed at length with the patient today.  Concerns regarding medicines are outlined above.  Orders Placed This Encounter  Procedures  . Ambulatory referral to Cardiac Electrophysiology  . EKG 12-Lead   No orders of the defined types were  placed in this encounter.   Patient Instructions  Medication Instructions:  No Changes In Medications at this time.  *If you need a refill on your cardiac medications before your next appointment, please call your pharmacy*  Follow-Up: At [facility], you and your health needs are our priority.  As part of our continuing mission to provide you with exceptional heart care, we have created designated Provider Care Teams.  These Care Teams include your primary Cardiologist (physician) and Advanced Practice Providers (APPs -  Physician Assistants and Nurse Practitioners) who all work together to provide you with the care you need, when you need it.  Your next appointment:   AS NEEDED   The format for your next appointment:   In Person  Provider:   Eleonore Chiquito, MD  Other Instructions YOU ARE [place] (Dr. Quentin Ore) Someone will reach out to you to schedule this.      Time Spent with Patient: I have spent a total of 25 minutes with patient reviewing hospital notes, telemetry, EKGs, labs and examining the patient as well as establishing an assessment and plan that was discussed with the patient.  > 50% of time was spent in direct patient care.  Signed, Addison Naegeli. Audie Box, [place]  [address] [place], [place] [address] [phone]  12/10/2019 3:04 PM

## 2019-12-10 ENCOUNTER — Encounter: Payer: Self-pay | Admitting: Cardiovascular Disease

## 2019-12-10 ENCOUNTER — Other Ambulatory Visit: Payer: Self-pay

## 2019-12-10 ENCOUNTER — Ambulatory Visit (INDEPENDENT_AMBULATORY_CARE_PROVIDER_SITE_OTHER): Payer: BC Managed Care – PPO | Admitting: Cardiovascular Disease

## 2019-12-10 VITALS — BP 115/75 | HR 66 | Ht 71.0 in | Wt 187.8 lb

## 2019-12-10 DIAGNOSIS — I471 Supraventricular tachycardia: Secondary | ICD-10-CM

## 2019-12-10 DIAGNOSIS — R002 Palpitations: Secondary | ICD-10-CM

## 2019-12-10 NOTE — Patient Instructions (Signed)
Medication Instructions:  No Changes In Medications at this time.  *If you need a refill on your cardiac medications before your next appointment, please call your pharmacy*  Follow-Up: At [facility], you and your health needs are our priority.  As part of our continuing mission to provide you with exceptional heart care, we have created designated Provider Care Teams.  These Care Teams include your primary Cardiologist (physician) and Advanced Practice Providers (APPs -  Physician Assistants and Nurse Practitioners) who all work together to provide you with the care you need, when you need it.  Your next appointment:   AS NEEDED   The format for your next appointment:   In Person  Provider:   Eleonore Chiquito, MD  Other Instructions YOU ARE Lauren Morton (Dr. Quentin Ore) Someone will reach out to you to schedule this.

## 2019-12-22 ENCOUNTER — Encounter: Payer: Self-pay | Admitting: Cardiology

## 2020-01-11 ENCOUNTER — Other Ambulatory Visit (INDEPENDENT_AMBULATORY_CARE_PROVIDER_SITE_OTHER): Payer: BC Managed Care – PPO

## 2020-01-11 ENCOUNTER — Other Ambulatory Visit: Payer: Self-pay

## 2020-01-11 DIAGNOSIS — E890 Postprocedural hypothyroidism: Secondary | ICD-10-CM

## 2020-01-11 LAB — T3, FREE: T3, Free: 2.8 pg/mL (ref 2.3–4.2)

## 2020-01-11 LAB — T4, FREE: Free T4: 1.4 ng/dL (ref 0.60–1.60)

## 2020-01-11 LAB — TSH: TSH: 0.66 u[IU]/mL (ref 0.35–4.50)

## 2020-01-13 ENCOUNTER — Encounter: Payer: Self-pay | Admitting: Cardiology

## 2020-01-13 ENCOUNTER — Other Ambulatory Visit: Payer: Self-pay

## 2020-01-13 ENCOUNTER — Ambulatory Visit: Payer: BC Managed Care – PPO | Admitting: Cardiology

## 2020-01-13 ENCOUNTER — Telehealth: Payer: Self-pay

## 2020-01-13 VITALS — BP 106/76 | HR 63 | Ht 71.0 in | Wt 185.0 lb

## 2020-01-13 DIAGNOSIS — I471 Supraventricular tachycardia: Secondary | ICD-10-CM | POA: Diagnosis not present

## 2020-01-13 NOTE — H&P (View-Only) (Signed)
Electrophysiology Office Note:    Date:  01/13/2020   ID:  Lauren Morton, DOB [birth year], MRN [id number]  PCP:  Ria Bush, MD  [facility] Cardiologist:  No primary care provider on file.  [facility] Electrophysiologist:  Vickie Epley, MD   Referring MD: Geralynn Rile, *   Chief Complaint: SVT  History of Present Illness:    Lauren Morton is a 35 y.o. female who presents for an evaluation of SVT at the request of Dr. Davina Poke. Their medical history includes prior pregnancy, Hashimoto's thyroiditis.  Patient was seen by Dr. Davina Poke December 10, 2019.  He had previously seen her in May for SVT that was adenosine sensitive.  During that visit she reported having an episode of SVT during her delivery in July 2021.  She describes at least monthly episodes of the SVT that last seconds at a time.  She has had bear down in the past to break the SVT.  No syncope or presyncope.  Given that she is interested in pursuing future pregnancy, Dr. Davina Poke has referred her to me to discuss definitive management with a EP study and ablation.  During my visit with the patient today, she tells me that her episodes of SVT dating back to her high school days.  They generally lasted seconds at a time.  She delivered her child in July of this past year.  When she was 4 or 5 months pregnant she had an episode of SVT that lasted hours.  When it started she woke up and felt the racing heartbeat.  She decided to go back to sleep and woke up and still was in the SVT.  Given her persistent symptoms she presented to the hospital where adenosine was given and immediately resolved the arrhythmia.  She had another episode of SVT the day before she was scheduled to deliver.  Since she has delivered her child, she has had less frequent episodes of the SVT.  She is interested in having another child and would like to pursue definitive management of the SVT in an effort to reduce the chance of  recurrence during future pregnancy.  Past Medical History:  Diagnosis Date  . CKD (chronic kidney disease) stage 1, GFR 90 ml/min or greater 09/2013   mild proteinuria Lauren Morton) sees yearly  . Dysrhythmia    SVT during pregnancy  . Hashimoto's thyroiditis    confirmed by biopsy s/p thyroidectomy  . Hypothyroidism, acquired, autoimmune   . Papillary thyroid carcinoma ([place]) 03/31/2014   s/p thyroidectomy, 10 negative LN    Past Surgical History:  Procedure Laterality Date  . CESAREAN SECTION N/A 07/15/2019   Procedure: [id number] CESAREAN SECTION;  Surgeon: Everlene Farrier, MD;  Location: [facility];  Service: Obstetrics;  Laterality: N/A;  . LYMPH NODE DISSECTION N/A 06/03/2014   Procedure: LIMITED LYMPH NODE DISSECTION;  Surgeon: Armandina Gemma, MD;  Location: [facility];  Service: General;  Laterality: N/A;  . THYROIDECTOMY N/A 06/03/2014   total - 1.5cm papillary thyroid carcinoma with negative surgical margins and 10/10 LN neg for mets; Armandina Gemma, MD  . WISDOM TOOTH EXTRACTION     4    Current Medications: Current Meds  Medication Sig  . levothyroxine (SYNTHROID) 150 MCG tablet Take 1 tablet (150 mcg total) by mouth daily before breakfast.  . Prenatal Vit-Fe Fumarate-FA (MULTIVITAMIN-PRENATAL) 27-0.8 MG TABS tablet Take 1 tablet by mouth daily in the afternoon.  . [DISCONTINUED] norethindrone (MICRONOR) 0.35 MG tablet Take 1 tablet by mouth  daily.     Allergies:   Patient has no known allergies.   Social History   Socioeconomic History  . Marital status: Married    Spouse name: Not on file  . Number of children: 1  . Years of education: Not on file  . Highest education level: Not on file  Occupational History  . Not on file  Tobacco Use  . Smoking status: Never Smoker  . Smokeless tobacco: Never Used  Vaping Use  . Vaping Use: Never used  Substance and Sexual Activity  . Alcohol use: Not Currently  . Drug use: No  . Sexual activity: Not on file  Other Topics Concern  .  Not on file  Social History Narrative   Caffeine: 1 soda at lunch   Lives with husband, 2 dogs and 1 pig, chickens   Occupation: school counselor   Edu: Masters in education   Activity: runs, has done 1/2 marathons   Diet: good water, good fruits/vegetables, red meat 2x/wk, fish 1x/wk   Social Determinants of Radio broadcast assistant Strain: Not on file  Food Insecurity: Not on file  Transportation Needs: Not on file  Physical Activity: Not on file  Stress: Not on file  Social Connections: Not on file     Family History: The patient's family history includes Alcohol abuse in her paternal grandmother; Cancer in her maternal grandmother; Cancer (age of onset: 22) in her mother; Coronary artery disease in her maternal grandfather; Diabetes in her father; Heart disease in her maternal grandfather; Heart failure in her maternal grandfather; Thyroid disease in her maternal grandmother. There is no history of Stroke.  ROS:   Please see the history of present illness.    All other systems reviewed and are negative.  EKGs/Labs/Other Studies Reviewed:    The following studies were reviewed today: Prior notes  EKG:  The ekg ordered today demonstrates normal sinus rhythm.  July 14, 2016 personally reviewed Narrow complex tachycardia with a ventricular rate of around 200 bpm.  Appearance is consistent with AVNRT    07/15/2019 EKG personally reviewed Sinus rhythm.    Recent Labs: 03/28/2019: ALT 30; B Natriuretic Peptide 516.1; Magnesium 1.6 05/03/2019: BUN 9; Creatinine, Ser 0.84; Potassium 3.8; Sodium 139 07/16/2019: Hemoglobin 10.5; Platelets 187 01/11/2020: TSH 0.66  Recent Lipid Panel    Component Value Date/Time   CHOL 188 03/31/2014 1227   TRIG 70.0 03/31/2014 1227   HDL 69.90 03/31/2014 1227   CHOLHDL 3 03/31/2014 1227   VLDL 14.0 03/31/2014 1227   LDLCALC 104 (H) 03/31/2014 1227    Physical Exam:    VS:  BP 106/76   Pulse 63   Ht 5\' 11"  (1.803 m)   Wt 185 lb  (83.9 kg)   SpO2 97%   BMI 25.80 kg/m     Wt Readings from Last 3 Encounters:  01/13/20 185 lb (83.9 kg)  12/10/19 187 lb 12.8 oz (85.2 kg)  09/16/19 178 lb (80.7 kg)     GEN:  Well nourished, well developed in no acute distress HEENT: Normal NECK: No JVD; No carotid bruits LYMPHATICS: No lymphadenopathy CARDIAC: RRR, no murmurs, rubs, gallops RESPIRATORY:  Clear to auscultation without rales, wheezing or rhonchi  ABDOMEN: Soft, non-tender, non-distended MUSCULOSKELETAL:  No edema; No deformity  SKIN: Warm and dry NEUROLOGIC:  Alert and oriented x 3 PSYCHIATRIC:  Normal affect   ASSESSMENT:    1. SVT (supraventricular tachycardia) (HCC)    PLAN:    In order of problems  listed above:  1. SVT Patient with symptomatic episodes of adenosine sensitive SVT.  Likely AVNRT.  Patient desires future pregnancies and thus would like to pursue definitive management strategy.  I discussed antiarrhythmic therapy, continued calcium channel blocker therapy and ablation with the patient during today's visit.  We discussed the pros and cons of each option.  Discussed the risks, expected recovery and success rates of ablation.  She wishes to proceed with scheduling.  Therapeutic strategies for supraventricular tachycardia including medicine and ablation were discussed in detail with the patient today. Risk, benefits, and alternatives to EP study and radiofrequency ablation were also discussed in detail today. These risks include but are not limited to stroke, bleeding, vascular damage, tamponade, perforation, damage to the heart and other structures, AV block requiring pacemaker, worsening renal function, and death. The patient understands these risk and wishes to proceed.  We will therefore proceed with catheter ablation at the next available time.  She will need an echo prior to the procedure.  Medication Adjustments/Labs and Tests Ordered: Current medicines are reviewed at length with the  patient today.  Concerns regarding medicines are outlined above.  Orders Placed This Encounter  Procedures  . EKG 12-Lead  . ECHOCARDIOGRAM COMPLETE   No orders of the defined types were placed in this encounter.    Signed, Lars Mage, MD, [facility]  01/13/2020 10:00 PM    Electrophysiology [place]

## 2020-01-13 NOTE — Progress Notes (Signed)
Electrophysiology Office Note:    Date:  01/13/2020   ID:  Lauren Morton, DOB 11/22/1985, MRN [id number]  PCP:  Ria Bush, MD  [facility] Cardiologist:  No primary care provider on file.  [facility] Electrophysiologist:  Vickie Epley, MD   Referring MD: Geralynn Rile, *   Chief Complaint: SVT  History of Present Illness:    Lauren Morton is a 35 y.o. female who presents for an evaluation of SVT at the request of Dr. Davina Poke. Their medical history includes prior pregnancy, Hashimoto's thyroiditis.  Patient was seen by Dr. Davina Poke December 10, 2019.  He had previously seen her in May for SVT that was adenosine sensitive.  During that visit she reported having an episode of SVT during her delivery in July 2021.  She describes at least monthly episodes of the SVT that last seconds at a time.  She has had bear down in the past to break the SVT.  No syncope or presyncope.  Given that she is interested in pursuing future pregnancy, Dr. Davina Poke has referred her to me to discuss definitive management with a EP study and ablation.  During my visit with the patient today, she tells me that her episodes of SVT dating back to her high school days.  They generally lasted seconds at a time.  She delivered her child in July of this past year.  When she was 4 or 5 months pregnant she had an episode of SVT that lasted hours.  When it started she woke up and felt the racing heartbeat.  She decided to go back to sleep and woke up and still was in the SVT.  Given her persistent symptoms she presented to the hospital where adenosine was given and immediately resolved the arrhythmia.  She had another episode of SVT the day before she was scheduled to deliver.  Since she has delivered her child, she has had less frequent episodes of the SVT.  She is interested in having another child and would like to pursue definitive management of the SVT in an effort to reduce the chance of  recurrence during future pregnancy.  Past Medical History:  Diagnosis Date  . CKD (chronic kidney disease) stage 1, GFR 90 ml/min or greater 09/2013   mild proteinuria [facility]) sees yearly  . Dysrhythmia    SVT during pregnancy  . Hashimoto's thyroiditis    confirmed by biopsy s/p thyroidectomy  . Hypothyroidism, acquired, autoimmune   . Papillary thyroid carcinoma ([place]) 03/31/2014   s/p thyroidectomy, 10 negative LN    Past Surgical History:  Procedure Laterality Date  . CESAREAN SECTION N/A 07/15/2019   Procedure: [id number] CESAREAN SECTION;  Surgeon: Everlene Farrier, MD;  Location: [place];  Service: Obstetrics;  Laterality: N/A;  . LYMPH NODE DISSECTION N/A 06/03/2014   Procedure: LIMITED LYMPH NODE DISSECTION;  Surgeon: Armandina Gemma, MD;  Location: [facility];  Service: General;  Laterality: N/A;  . THYROIDECTOMY N/A 06/03/2014   total - 1.5cm papillary thyroid carcinoma with negative surgical margins and 10/10 LN neg for mets; Armandina Gemma, MD  . WISDOM TOOTH EXTRACTION     4    Current Medications: Current Meds  Medication Sig  . levothyroxine (SYNTHROID) 150 MCG tablet Take 1 tablet (150 mcg total) by mouth daily before breakfast.  . Prenatal Vit-Fe Fumarate-FA (MULTIVITAMIN-PRENATAL) 27-0.8 MG TABS tablet Take 1 tablet by mouth daily in the afternoon.  . [DISCONTINUED] norethindrone (MICRONOR) 0.35 MG tablet Take 1 tablet by mouth  daily.     Allergies:   Patient has no known allergies.   Social History   Socioeconomic History  . Marital status: Married    Spouse name: Not on file  . Number of children: 1  . Years of education: Not on file  . Highest education level: Not on file  Occupational History  . Not on file  Tobacco Use  . Smoking status: Never Smoker  . Smokeless tobacco: Never Used  Vaping Use  . Vaping Use: Never used  Substance and Sexual Activity  . Alcohol use: Not Currently  . Drug use: No  . Sexual activity: Not on file  Other Topics Concern  .  Not on file  Social History Narrative   Caffeine: 1 soda at lunch   Lives with husband, 2 dogs and 1 pig, chickens   Occupation: school counselor   Edu: Masters in education   Activity: runs, has done 1/2 marathons   Diet: good water, good fruits/vegetables, red meat 2x/wk, fish 1x/wk   Social Determinants of Radio broadcast assistant Strain: Not on file  Food Insecurity: Not on file  Transportation Needs: Not on file  Physical Activity: Not on file  Stress: Not on file  Social Connections: Not on file     Family History: The patient's family history includes Alcohol abuse in her paternal grandmother; Cancer in her maternal grandmother; Cancer (age of onset: 22) in her mother; Coronary artery disease in her maternal grandfather; Diabetes in her father; Heart disease in her maternal grandfather; Heart failure in her maternal grandfather; Thyroid disease in her maternal grandmother. There is no history of Stroke.  ROS:   Please see the history of present illness.    All other systems reviewed and are negative.  EKGs/Labs/Other Studies Reviewed:    The following studies were reviewed today: Prior notes  EKG:  The ekg ordered today demonstrates normal sinus rhythm.  July 14, 2016 personally reviewed Narrow complex tachycardia with a ventricular rate of around 200 bpm.  Appearance is consistent with AVNRT    07/15/2019 EKG personally reviewed Sinus rhythm.    Recent Labs: 03/28/2019: ALT 30; B Natriuretic Peptide 516.1; Magnesium 1.6 05/03/2019: BUN 9; Creatinine, Ser 0.84; Potassium 3.8; Sodium 139 07/16/2019: Hemoglobin 10.5; Platelets 187 01/11/2020: TSH 0.66  Recent Lipid Panel    Component Value Date/Time   CHOL 188 03/31/2014 1227   TRIG 70.0 03/31/2014 1227   HDL 69.90 03/31/2014 1227   CHOLHDL 3 03/31/2014 1227   VLDL 14.0 03/31/2014 1227   LDLCALC 104 (H) 03/31/2014 1227    Physical Exam:    VS:  BP 106/76   Pulse 63   Ht 5\' 11"  (1.803 m)   Wt 185 lb  (83.9 kg)   SpO2 97%   BMI 25.80 kg/m     Wt Readings from Last 3 Encounters:  01/13/20 185 lb (83.9 kg)  12/10/19 187 lb 12.8 oz (85.2 kg)  09/16/19 178 lb (80.7 kg)     GEN:  Well nourished, well developed in no acute distress HEENT: Normal NECK: No JVD; No carotid bruits LYMPHATICS: No lymphadenopathy CARDIAC: RRR, no murmurs, rubs, gallops RESPIRATORY:  Clear to auscultation without rales, wheezing or rhonchi  ABDOMEN: Soft, non-tender, non-distended MUSCULOSKELETAL:  No edema; No deformity  SKIN: Warm and dry NEUROLOGIC:  Alert and oriented x 3 PSYCHIATRIC:  Normal affect   ASSESSMENT:    1. SVT (supraventricular tachycardia) (HCC)    PLAN:    In order of problems  listed above:  1. SVT Patient with symptomatic episodes of adenosine sensitive SVT.  Likely AVNRT.  Patient desires future pregnancies and thus would like to pursue definitive management strategy.  I discussed antiarrhythmic therapy, continued calcium channel blocker therapy and ablation with the patient during today's visit.  We discussed the pros and cons of each option.  Discussed the risks, expected recovery and success rates of ablation.  She wishes to proceed with scheduling.  Therapeutic strategies for supraventricular tachycardia including medicine and ablation were discussed in detail with the patient today. Risk, benefits, and alternatives to EP study and radiofrequency ablation were also discussed in detail today. These risks include but are not limited to stroke, bleeding, vascular damage, tamponade, perforation, damage to the heart and other structures, AV block requiring pacemaker, worsening renal function, and death. The patient understands these risk and wishes to proceed.  We will therefore proceed with catheter ablation at the next available time.  She will need an echo prior to the procedure.  Medication Adjustments/Labs and Tests Ordered: Current medicines are reviewed at length with the  patient today.  Concerns regarding medicines are outlined above.  Orders Placed This Encounter  Procedures  . EKG 12-Lead  . ECHOCARDIOGRAM COMPLETE   No orders of the defined types were placed in this encounter.    Signed, Lars Mage, MD, [facility]  01/13/2020 10:00 PM    Electrophysiology [place]

## 2020-01-13 NOTE — Patient Instructions (Addendum)
Medication Instructions:  Your physician recommends that you continue on your current medications as directed. Please refer to the Current Medication list given to you today.  Labwork: None ordered.  Testing/Procedures: Your physician has requested that you have an echocardiogram. Echocardiography is a painless test that uses sound waves to create images of your heart. It provides your doctor with information about the size and shape of your heart and how well your heart's chambers and valves are working. This procedure takes approximately one hour. There are no restrictions for this procedure.  Please schedule for ECHO  Follow-Up:  You will be scheduled for an SVT ablation  Any Other Special Instructions Will Be Listed Below (If Applicable).  If you need a refill on your cardiac medications before your next appointment, please call your pharmacy.    Cardiac Ablation Cardiac ablation is a procedure to destroy, or ablate, a small amount of heart tissue in very specific places. The heart has many electrical connections. Sometimes these connections are abnormal and can cause the heart to beat very fast or irregularly. Ablating some of the areas that cause problems can improve the heart's rhythm or return it to normal. Ablation may be done for people who:  Have Wolff-Parkinson-White syndrome.  Have fast heart rhythms (tachycardia).  Have taken medicines for an abnormal heart rhythm (arrhythmia) that were not effective or caused side effects.  Have a high-risk heartbeat that may be life-threatening. During the procedure, a small incision is made in the neck or the groin, and a long, thin tube (catheter) is inserted into the incision and moved to the heart. Small devices (electrodes) on the tip of the catheter will send out electrical currents. A type of X-ray (fluoroscopy) will be used to help guide the catheter and to provide images of the heart. Tell a health care provider about:  Any  allergies you have.  All medicines you are taking, including vitamins, herbs, eye drops, creams, and over-the-counter medicines.  Any problems you or family members have had with anesthetic medicines.  Any blood disorders you have.  Any surgeries you have had.  Any medical conditions you have, such as kidney failure.  Whether you are pregnant or may be pregnant. What are the risks? Generally, this is a safe procedure. However, problems may occur, including:  Infection.  Bruising and bleeding at the catheter insertion site.  Bleeding into the chest, especially into the sac that surrounds the heart. This is a serious complication.  Stroke or blood clots.  Damage to nearby structures or organs.  Allergic reaction to medicines or dyes.  Need for a permanent pacemaker if the normal electrical system is damaged. A pacemaker is a small computer that sends electrical signals to the heart and helps your heart beat normally.  The procedure not being fully effective. This may not be recognized until months later. Repeat ablation procedures are sometimes done. What happens before the procedure? Medicines Ask your health care provider about:  Changing or stopping your regular medicines. This is especially important if you are taking diabetes medicines or blood thinners.  Taking medicines such as aspirin and ibuprofen. These medicines can thin your blood. Do not take these medicines unless your health care provider tells you to take them.  Taking over-the-counter medicines, vitamins, herbs, and supplements. General instructions  Follow instructions from your health care provider about eating or drinking restrictions.  Plan to have someone take you home from the hospital or clinic.  If you will be going home  right after the procedure, plan to have someone with you for 24 hours.  Ask your health care provider what steps will be taken to prevent infection. What happens during the  procedure?  An IV will be inserted into one of your veins.  You will be given a medicine to help you relax (sedative).  The skin on your neck or groin will be numbed.  An incision will be made in your neck or your groin.  A needle will be inserted through the incision and into a large vein in your neck or groin.  A catheter will be inserted into the needle and moved to your heart.  Dye may be injected through the catheter to help your surgeon see the area of the heart that needs treatment.  Electrical currents will be sent from the catheter to ablate heart tissue in desired areas. There are three types of energy that may be used to do this: ? Heat (radiofrequency energy). ? Laser energy. ? Extreme cold (cryoablation).  When the tissue has been ablated, the catheter will be removed.  Pressure will be held on the insertion area to prevent a lot of bleeding.  A bandage (dressing) will be placed over the insertion area. The exact procedure may vary among health care providers and hospitals.   What happens after the procedure?  Your blood pressure, heart rate, breathing rate, and blood oxygen level will be monitored until you leave the hospital or clinic.  Your insertion area will be monitored for bleeding. You will need to lie still for a few hours to ensure that you do not bleed from the insertion area.  Do not drive for 24 hours or as long as told by your health care provider. Summary  Cardiac ablation is a procedure to destroy, or ablate, a small amount of heart tissue using an electrical current. This procedure can improve the heart rhythm or return it to normal.  Tell your health care provider about any medical conditions you may have and all medicines you are taking to treat them.  This is a safe procedure, but problems may occur. Problems may include infection, bruising, damage to nearby organs or structures, or allergic reactions to medicines.  Follow your health care  provider's instructions about eating and drinking before the procedure. You may also be told to change or stop some of your medicines.  After the procedure, do not drive for 24 hours or as long as told by your health care provider. This information is not intended to replace advice given to you by your health care provider. Make sure you discuss any questions you have with your health care provider. Document Revised: 10/27/2018 Document Reviewed: 10/27/2018 Elsevier Patient Education  [employer].

## 2020-01-14 NOTE — Telephone Encounter (Signed)
Left detailed message advising February 1 available for procedure.  Sent mychart message also.

## 2020-01-20 NOTE — Telephone Encounter (Signed)
Left message requesting call back to schedule lab work and covid test for SVT ablation on 02/02/20.  Also sent mychart message.

## 2020-01-22 NOTE — Telephone Encounter (Signed)
Pt scheduled for procedure on 02/02/20 at 10:30 am with Dr. Quentin Ore.  Will get labs/echo/covid test on 01/29/20  Instruction letter sent via mychart.  Work up complete.

## 2020-01-29 ENCOUNTER — Other Ambulatory Visit (HOSPITAL_COMMUNITY)
Admission: RE | Admit: 2020-01-29 | Discharge: 2020-01-29 | Disposition: A | Discharge status: Home / Self Care | Payer: BC Managed Care – PPO | Source: Ambulatory Visit | Attending: Cardiology | Admitting: Cardiology

## 2020-01-29 ENCOUNTER — Other Ambulatory Visit: Payer: BC Managed Care – PPO | Admitting: *Deleted

## 2020-01-29 ENCOUNTER — Ambulatory Visit (HOSPITAL_BASED_OUTPATIENT_CLINIC_OR_DEPARTMENT_OTHER): Payer: BC Managed Care – PPO

## 2020-01-29 ENCOUNTER — Other Ambulatory Visit: Payer: Self-pay

## 2020-01-29 DIAGNOSIS — I471 Supraventricular tachycardia: Secondary | ICD-10-CM

## 2020-01-29 DIAGNOSIS — N189 Chronic kidney disease, unspecified: Secondary | ICD-10-CM | POA: Insufficient documentation

## 2020-01-29 DIAGNOSIS — Z01818 Encounter for other preprocedural examination: Secondary | ICD-10-CM | POA: Insufficient documentation

## 2020-01-29 DIAGNOSIS — E039 Hypothyroidism, unspecified: Secondary | ICD-10-CM | POA: Insufficient documentation

## 2020-01-29 DIAGNOSIS — Z20822 Contact with and (suspected) exposure to covid-19: Secondary | ICD-10-CM | POA: Insufficient documentation

## 2020-01-29 LAB — ECHOCARDIOGRAM COMPLETE
Area-P 1/2: 2.48 cm2
S' Lateral: 3.1 cm

## 2020-01-30 LAB — BASIC METABOLIC PANEL
BUN/Creatinine Ratio: 13 (ref 9–23)
BUN: 9 mg/dL (ref 6–20)
CO2: 23 mmol/L (ref 20–29)
Calcium: 9.4 mg/dL (ref 8.7–10.2)
Chloride: 105 mmol/L (ref 96–106)
Creatinine, Ser: 0.71 mg/dL (ref 0.57–1.00)
GFR calc Af Amer: 127 mL/min/{1.73_m2} (ref >59)
GFR calc non Af Amer: 110 mL/min/{1.73_m2} (ref >59)
Glucose: 120 mg/dL — ABNORMAL HIGH (ref 65–99)
Potassium: 4.2 mmol/L (ref 3.5–5.2)
Sodium: 141 mmol/L (ref 134–144)

## 2020-01-30 LAB — CBC WITH DIFFERENTIAL/PLATELET
Basophils Absolute: 0.1 10*3/uL (ref 0.0–0.2)
Basos: 1 %
EOS (ABSOLUTE): 0.2 10*3/uL (ref 0.0–0.4)
Eos: 2 %
Hematocrit: 42.1 % (ref 34.0–46.6)
Hemoglobin: 14.2 g/dL (ref 11.1–15.9)
Immature Grans (Abs): 0 10*3/uL (ref 0.0–0.1)
Immature Granulocytes: 0 %
Lymphocytes Absolute: 3.1 10*3/uL (ref 0.7–3.1)
Lymphs: 35 %
MCH: 30.1 pg (ref 26.6–33.0)
MCHC: 33.7 g/dL (ref 31.5–35.7)
MCV: 89 fL (ref 79–97)
Monocytes Absolute: 0.7 10*3/uL (ref 0.1–0.9)
Monocytes: 8 %
Neutrophils Absolute: 4.8 10*3/uL (ref 1.4–7.0)
Neutrophils: 54 %
Platelets: 272 10*3/uL (ref 150–450)
RBC: 4.72 x10E6/uL (ref 3.77–5.28)
RDW: 12.7 % (ref 11.7–15.4)
WBC: 8.9 10*3/uL (ref 3.4–10.8)

## 2020-01-30 LAB — SARS CORONAVIRUS 2 (TAT 6-24 HRS): SARS Coronavirus 2: NEGATIVE

## 2020-02-01 NOTE — Progress Notes (Signed)
Attempted to call patient regarding instructions for tomorrows procedure. Left voice mail regarding the following things- Arrive time 8:30, nothing to eat of drink after midnight, no medications in the morning, you will need responsible adult to drive you home tomorrow as well as stay with you for 1st 24hours

## 2020-02-02 ENCOUNTER — Ambulatory Visit (HOSPITAL_COMMUNITY)
Admission: RE | Admit: 2020-02-02 | Discharge: 2020-02-02 | Disposition: A | Discharge status: Home / Self Care | Payer: BC Managed Care – PPO | Attending: Cardiology | Admitting: Cardiology

## 2020-02-02 ENCOUNTER — Other Ambulatory Visit: Payer: Self-pay

## 2020-02-02 ENCOUNTER — Ambulatory Visit (HOSPITAL_COMMUNITY): Payer: BC Managed Care – PPO | Admitting: Certified Registered"

## 2020-02-02 ENCOUNTER — Encounter (HOSPITAL_COMMUNITY)
Admission: RE | Disposition: A | Discharge status: Home / Self Care | Payer: Self-pay | Source: Home / Self Care | Attending: Cardiology

## 2020-02-02 DIAGNOSIS — I471 Supraventricular tachycardia: Secondary | ICD-10-CM | POA: Insufficient documentation

## 2020-02-02 DIAGNOSIS — Z79890 Hormone replacement therapy (postmenopausal): Secondary | ICD-10-CM | POA: Insufficient documentation

## 2020-02-02 HISTORY — PX: SVT ABLATION: EP1225

## 2020-02-02 LAB — PREGNANCY, URINE: Preg Test, Ur: NEGATIVE

## 2020-02-02 SURGERY — SVT ABLATION
Anesthesia: Monitor Anesthesia Care

## 2020-02-02 MED ORDER — HEPARIN (PORCINE) IN NACL 1000-0.9 UT/500ML-% IV SOLN
INTRAVENOUS | Status: DC | PRN
  Administered 2020-02-02 (×2): 500 mL

## 2020-02-02 MED ORDER — PROPOFOL 500 MG/50ML IV EMUL
INTRAVENOUS | Status: DC | PRN
  Administered 2020-02-02: 30 ug/kg/min via INTRAVENOUS

## 2020-02-02 MED ORDER — SODIUM CHLORIDE 0.9 % IV SOLN: INTRAVENOUS | Status: DC

## 2020-02-02 MED ORDER — PROPOFOL 10 MG/ML IV BOLUS
INTRAVENOUS | Status: DC | PRN
  Administered 2020-02-02 (×2): 10 mg via INTRAVENOUS
  Administered 2020-02-02 (×2): 20 mg via INTRAVENOUS

## 2020-02-02 MED ORDER — SODIUM CHLORIDE 0.9 % IV SOLN: 250.0000 mL | INTRAVENOUS | Status: DC | PRN

## 2020-02-02 MED ORDER — BUPIVACAINE HCL (PF) 0.25 % IJ SOLN
INTRAMUSCULAR | Status: DC | PRN
  Administered 2020-02-02: 30 mL

## 2020-02-02 MED ORDER — MIDAZOLAM HCL 5 MG/5ML IJ SOLN
INTRAMUSCULAR | Status: DC | PRN
  Administered 2020-02-02 (×2): 1 mg via INTRAVENOUS

## 2020-02-02 MED ORDER — SCOPOLAMINE 1 MG/3DAYS TD PT72
MEDICATED_PATCH | TRANSDERMAL | Status: AC
  Administered 2020-02-02: 1.5 mg
  Filled 2020-02-02: qty 1

## 2020-02-02 MED ORDER — SCOPOLAMINE 1 MG/3DAYS TD PT72: 1.0000 | MEDICATED_PATCH | TRANSDERMAL | Status: DC

## 2020-02-02 MED ORDER — ACETAMINOPHEN 325 MG PO TABS
650.0000 mg | ORAL_TABLET | ORAL | Status: DC | PRN
  Filled 2020-02-02: qty 2

## 2020-02-02 MED ORDER — SODIUM CHLORIDE 0.9% FLUSH: 3.0000 mL | INTRAVENOUS | Status: DC | PRN

## 2020-02-02 MED ORDER — BUPIVACAINE HCL (PF) 0.25 % IJ SOLN
INTRAMUSCULAR | Status: AC
  Filled 2020-02-02: qty 30

## 2020-02-02 MED ORDER — FENTANYL CITRATE (PF) 100 MCG/2ML IJ SOLN
INTRAMUSCULAR | Status: DC | PRN
  Administered 2020-02-02 (×4): 25 ug via INTRAVENOUS

## 2020-02-02 MED ORDER — ACETAMINOPHEN 500 MG PO TABS
ORAL_TABLET | ORAL | Status: AC
  Filled 2020-02-02: qty 2

## 2020-02-02 MED ORDER — HEPARIN SODIUM (PORCINE) 1000 UNIT/ML IJ SOLN
INTRAMUSCULAR | Status: DC | PRN
  Administered 2020-02-02: 1000 [IU] via INTRAVENOUS

## 2020-02-02 MED ORDER — ONDANSETRON HCL 4 MG/2ML IJ SOLN
INTRAMUSCULAR | Status: DC | PRN
  Administered 2020-02-02: 4 mg via INTRAVENOUS

## 2020-02-02 MED ORDER — ACETAMINOPHEN 500 MG PO TABS
1000.0000 mg | ORAL_TABLET | Freq: Once | ORAL | Status: AC
  Administered 2020-02-02: 1000 mg via ORAL
  Filled 2020-02-02: qty 2

## 2020-02-02 MED ORDER — HEPARIN SODIUM (PORCINE) 1000 UNIT/ML IJ SOLN
INTRAMUSCULAR | Status: AC
  Filled 2020-02-02: qty 1

## 2020-02-02 MED ORDER — SODIUM CHLORIDE 0.9% FLUSH: 3.0000 mL | Freq: Two times a day (BID) | INTRAVENOUS | Status: DC

## 2020-02-02 MED ORDER — LIDOCAINE 2% (20 MG/ML) 5 ML SYRINGE
INTRAMUSCULAR | Status: DC | PRN
  Administered 2020-02-02: 50 mg via INTRAVENOUS

## 2020-02-02 MED ORDER — ONDANSETRON HCL 4 MG/2ML IJ SOLN: 4.0000 mg | Freq: Four times a day (QID) | INTRAMUSCULAR | Status: DC | PRN

## 2020-02-02 MED ORDER — ISOPROTERENOL HCL 0.2 MG/ML IJ SOLN
INTRAMUSCULAR | Status: AC
  Filled 2020-02-02: qty 5

## 2020-02-02 MED ORDER — ISOPROTERENOL HCL 0.2 MG/ML IJ SOLN
INTRAVENOUS | Status: DC | PRN
  Administered 2020-02-02: 2 ug/min via INTRAVENOUS

## 2020-02-02 SURGICAL SUPPLY — 17 items
BLANKET WARM UNDERBOD FULL ACC (MISCELLANEOUS) ×1 IMPLANT
CATH CRD2 QUAD 6FR 5 (CATHETERS) ×1 IMPLANT
CATH DECANAV F CURVE (CATHETERS) ×1 IMPLANT
CATH JOSEPH QUAD ALLRED 6F REP (CATHETERS) IMPLANT
CATH JOSEPHSON QUAD-ALLRED 6FR (CATHETERS) ×1 IMPLANT
CATH SMTCH THERMOCOOL SF DF (CATHETERS) ×1 IMPLANT
CLOSURE PERCLOSE PROSTYLE (VASCULAR PRODUCTS) ×3 IMPLANT
MAT PREVALON FULL STRYKER (MISCELLANEOUS) ×1 IMPLANT
PACK EP LATEX FREE (CUSTOM PROCEDURE TRAY) ×2
PACK EP LF (CUSTOM PROCEDURE TRAY) ×1 IMPLANT
PAD PRO RADIOLUCENT 2001M-C (PAD) ×2 IMPLANT
PATCH CARTO3 (PAD) ×1 IMPLANT
SHEATH CARTO VIZIGO SM CVD (SHEATH) ×1 IMPLANT
SHEATH PINNACLE 7F 10CM (SHEATH) ×1 IMPLANT
SHEATH PINNACLE 8F 10CM (SHEATH) ×2 IMPLANT
SHEATH PROBE COVER 6X72 (BAG) ×1 IMPLANT
TUBING SMART ABLATE COOLFLOW (TUBING) ×1 IMPLANT

## 2020-02-02 NOTE — Transfer of Care (Signed)
Immediate Anesthesia Transfer of Care Note  Patient: Lauren Morton  Procedure(s) Performed: SVT ABLATION (N/A )  Patient Location: PACU  Anesthesia Type:MAC  Level of Consciousness: awake, alert , oriented and patient cooperative  Airway & Oxygen Therapy: Patient Spontanous Breathing  Post-op Assessment: Report given to RN and Post -op Vital signs reviewed and stable  Post vital signs: Reviewed and stable  Last Vitals:  Vitals Value Taken Time  BP 120/75 02/02/20 1309  Temp    Pulse 68 02/02/20 1310  Resp 15 02/02/20 1310  SpO2 99 % 02/02/20 1310  Vitals shown include unvalidated device data.  Last Pain:  Vitals:   02/02/20 0901  TempSrc:   PainSc: 0-No pain      Patients Stated Pain Goal: 3 (87/86/76 7209)  Complications: No complications documented.

## 2020-02-02 NOTE — Anesthesia Postprocedure Evaluation (Signed)
Anesthesia Post Note  Patient: Lauren Morton  Procedure(s) Performed: SVT ABLATION (N/A )     Patient location during evaluation: PACU Anesthesia Type: MAC Level of consciousness: awake and alert Pain management: pain level controlled Vital Signs Assessment: post-procedure vital signs reviewed and stable Respiratory status: spontaneous breathing and respiratory function stable Cardiovascular status: stable Postop Assessment: no apparent nausea or vomiting Anesthetic complications: no   No complications documented.  Last Vitals:  Vitals:   02/02/20 1410 02/02/20 1425  BP: 105/84 101/74  Pulse: (!) 54 (!) 56  Resp: 16 19  Temp:    SpO2: 100% 100%    Last Pain:  Vitals:   02/02/20 1310  TempSrc: Temporal  PainSc:                  [name]

## 2020-02-02 NOTE — Interval H&P Note (Signed)
History and Physical Interval Note:  02/02/2020 9:55 AM  Lauren Morton  has presented today for surgery, with the diagnosis of svt.  The various methods of treatment have been discussed with the patient and family. After consideration of risks, benefits and other options for treatment, the patient has consented to  Procedure(s): SVT ABLATION (N/A) as a surgical intervention.  The patient's history has been reviewed, patient examined, no change in status, stable for surgery.  I have reviewed the patient's chart and labs.  Questions were answered to the patient's satisfaction.     [name] T Nial Hawe

## 2020-02-02 NOTE — Anesthesia Preprocedure Evaluation (Addendum)
Anesthesia Evaluation  Patient identified by MRN, date of birth, ID band Patient awake    Reviewed: Allergy & Precautions, NPO status , Patient's Chart, lab work & pertinent test results  History of Anesthesia Complications Negative for: history of anesthetic complications  Airway Mallampati: I  TM Distance: >3 FB Neck ROM: Full    Dental  (+) Dental Advisory Given, Teeth Intact   Pulmonary neg pulmonary ROS,  01/29/2020 SARS coronavirus NEG   breath sounds clear to auscultation       Cardiovascular (-) angina+ dysrhythmias Supra Ventricular Tachycardia  Rhythm:Regular Rate:Normal  01/29/2020 ECHO: EF 60-65%, no significant valvular abnormalities   Neuro/Psych negative neurological ROS     GI/Hepatic negative GI ROS, Neg liver ROS,   Endo/Other  Hypothyroidism   Renal/GU Renal InsufficiencyRenal disease     Musculoskeletal   Abdominal   Peds  Hematology negative hematology ROS (+)   Anesthesia Other Findings   Reproductive/Obstetrics                            Anesthesia Physical Anesthesia Plan  ASA: II  Anesthesia Plan: MAC   Post-op Pain Management:    Induction:   PONV Risk Score and Plan: 2 and Ondansetron and Scopolamine patch - Pre-op  Airway Management Planned: Natural Airway and Simple Face Mask  Additional Equipment:   Intra-op Plan:   Post-operative Plan:   Informed Consent: I have reviewed the patients History and Physical, chart, labs and discussed the procedure including the risks, benefits and alternatives for the proposed anesthesia with the patient or authorized representative who has indicated his/her understanding and acceptance.     Dental advisory given  Plan Discussed with: CRNA and Surgeon  Anesthesia Plan Comments:        Anesthesia Quick Evaluation

## 2020-02-02 NOTE — Discharge Instructions (Signed)
Post procedure care instructions No driving for 4 days. No lifting over 5 lbs for 1 week. No vigorous or sexual activity for 1 week. You may return to work/your usual activities on 02/10/20. Keep procedure site clean & dry. If you notice increased pain, swelling, bleeding or pus, call/return!  You may shower after 24 hours, but no soaking in baths/hot tubs/pools for 1 week.    Cardiac Ablation, Care After  This sheet gives you information about how to care for yourself after your procedure. Your health care provider may also give you more specific instructions. If you have problems or questions, contact your health care provider. What can I expect after the procedure? After the procedure, it is common to have:  Bruising around your puncture site.  Tenderness around your puncture site.  Skipped heartbeats.  Tiredness (fatigue).  Follow these instructions at home: Puncture site care   Follow instructions from your health care provider about how to take care of your puncture site. Make sure you: ? If present, leave stitches (sutures), skin glue, or adhesive strips in place. These skin closures may need to stay in place for up to 2 weeks. If adhesive strip edges start to loosen and curl up, you may trim the loose edges. Do not remove adhesive strips completely unless your health care provider tells you to do that. ? If a large square bandage is present, this may be removed 24 hours after surgery.   Check your puncture site every day for signs of infection. Check for: ? Redness, swelling, or pain. ? Fluid or blood. If your puncture site starts to bleed, lie down on your back, apply firm pressure to the area, and contact your health care provider. ? Warmth. ? Pus or a bad smell. Driving  Do not drive for at least 4 days after your procedure or however long your health care provider recommends. (Do not resume driving if you have previously been instructed not to drive for other health  reasons.)  Do not drive or use heavy machinery while taking prescription pain medicine. Activity  Avoid activities that take a lot of effort for at least 7 days after your procedure.  Do not lift anything that is heavier than 5 lb (4.5 kg) for one week.   No sexual activity for 1 week.   Return to your normal activities as told by your health care provider. Ask your health care provider what activities are safe for you. General instructions  Take over-the-counter and prescription medicines only as told by your health care provider.  Do not use any products that contain nicotine or tobacco, such as cigarettes and e-cigarettes. If you need help quitting, ask your health care provider.  You may shower after 24 hours, but Do not take baths, swim, or use a hot tub for 1 week.   Do not drink alcohol for 24 hours after your procedure.  Keep all follow-up visits as told by your health care provider. This is important. Contact a health care provider if:  You have redness, mild swelling, or pain around your puncture site.  You have fluid or blood coming from your puncture site that stops after applying firm pressure to the area.  Your puncture site feels warm to the touch.  You have pus or a bad smell coming from your puncture site.  You have a fever.  You have chest pain or discomfort that spreads to your neck, jaw, or arm.  You are sweating a lot.  You  in the arm or leg closest to your puncture site. Get help right away if: Your puncture site suddenly swells. Your puncture site is bleeding and the bleeding does not stop after applying firm pressure to the area. These symptoms may represent a serious problem that is an emergency. Do not wait to see if the symptoms will go away. Get medical help right away. Call your local emergency services (911 in the [place] Do not drive  yourself to the hospital. Summary After the procedure, it is normal to have bruising and tenderness at the puncture site in your groin, neck, or forearm. Check your puncture site every day for signs of infection. Get help right away if your puncture site is bleeding and the bleeding does not stop after applying firm pressure to the area. This is a medical emergency. This information is not intended to replace advice given to you by your health care provider. Make sure you discuss any questions you have with your health care provider.   

## 2020-02-03 ENCOUNTER — Encounter (HOSPITAL_COMMUNITY): Payer: Self-pay | Admitting: Cardiology

## 2020-03-08 ENCOUNTER — Encounter: Payer: Self-pay | Admitting: Cardiology

## 2020-03-08 ENCOUNTER — Other Ambulatory Visit: Payer: Self-pay

## 2020-03-08 ENCOUNTER — Ambulatory Visit: Payer: BC Managed Care – PPO | Admitting: Cardiology

## 2020-03-08 DIAGNOSIS — I471 Supraventricular tachycardia, unspecified: Secondary | ICD-10-CM | POA: Insufficient documentation

## 2020-03-08 NOTE — Patient Instructions (Addendum)
Medication Instructions:  Your physician recommends that you continue on your current medications as directed. Please refer to the Current Medication list given to you today.  Labwork: None ordered.  Testing/Procedures: None ordered.  Follow-Up: Your physician wants you to follow-up in: as needed with Dr. Lambert.    Any Other Special Instructions Will Be Listed Below (If Applicable).  If you need a refill on your cardiac medications before your next appointment, please call your pharmacy.   

## 2020-03-08 NOTE — Progress Notes (Signed)
Electrophysiology Office Follow up Visit Note:    Date:  03/08/2020   ID:  Lauren Morton, DOB 1985/10/21, MRN [id number]  PCP:  Ria Bush, MD  [facility] Cardiologist:  No primary care provider on file.  [facility] Electrophysiologist:  Vickie Epley, MD    Interval History:    Lauren Morton is a 35 y.o. female who presents for a follow up visit.  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  Since the ablation, her IV access sites have healed well.  She has had no recurrence of her arrhythmia.  She tells me during today's visit that she recently learned that she is pregnant.  She is about 4 to [redacted] weeks along.      Past Medical History:  Diagnosis Date  . CKD (chronic kidney disease) stage 1, GFR 90 ml/min or greater 09/2013   mild proteinuria [facility]) sees yearly  . Dysrhythmia    SVT during pregnancy  . Hashimoto's thyroiditis    confirmed by biopsy s/p thyroidectomy  . Hypothyroidism, acquired, autoimmune   . Papillary thyroid carcinoma ([place]) 03/31/2014   s/p thyroidectomy, 10 negative LN    Past Surgical History:  Procedure Laterality Date  . CESAREAN SECTION N/A 07/15/2019   Procedure: [id number] CESAREAN SECTION;  Surgeon: Everlene Farrier, MD;  Location: [facility];  Service: Obstetrics;  Laterality: N/A;  . LYMPH NODE DISSECTION N/A 06/03/2014   Procedure: LIMITED LYMPH NODE DISSECTION;  Surgeon: Armandina Gemma, MD;  Location: [facility];  Service: General;  Laterality: N/A;  . SVT ABLATION N/A 02/02/2020   Procedure: SVT ABLATION;  Surgeon: Vickie Epley, MD;  Location: [facility];  Service: Cardiovascular;  Laterality: N/A;  . THYROIDECTOMY N/A 06/03/2014   total - 1.5cm papillary thyroid carcinoma with negative surgical margins and 10/10 LN neg for mets; Armandina Gemma, MD  . WISDOM TOOTH EXTRACTION     4    Current Medications: Current Meds  Medication Sig  .  levothyroxine (SYNTHROID) 150 MCG tablet Take 1 tablet (150 mcg total) by mouth daily before breakfast.  . Prenatal Vit-Fe Fumarate-FA (MULTIVITAMIN-PRENATAL) 27-0.8 MG TABS tablet Take 1 tablet by mouth daily in the afternoon.     Allergies:   Patient has no known allergies.   Social History   Socioeconomic History  . Marital status: Married    Spouse name: Not on file  . Number of children: 1  . Years of education: Not on file  . Highest education level: Not on file  Occupational History  . Not on file  Tobacco Use  . Smoking status: Never Smoker  . Smokeless tobacco: Never Used  Vaping Use  . Vaping Use: Never used  Substance and Sexual Activity  . Alcohol use: Not Currently  . Drug use: No  . Sexual activity: Not on file  Other Topics Concern  . Not on file  Social History Narrative   Caffeine: 1 soda at lunch   Lives with husband, 2 dogs and 1 pig, chickens   Occupation: school counselor   Edu: Masters in education   Activity: runs, has done 1/2 marathons   Diet: good water, good fruits/vegetables, red meat 2x/wk, fish 1x/wk   Social Determinants of Radio broadcast assistant Strain: Not on file  Food Insecurity: Not on file  Transportation Needs: Not on file  Physical Activity: Not on file  Stress: Not on file  Social Connections:  Not on file     Family History: The patient's family history includes Alcohol abuse in her paternal grandmother; Cancer in her maternal grandmother; Cancer (age of onset: 15) in her mother; Coronary artery disease in her maternal grandfather; Diabetes in her father; Heart disease in her maternal grandfather; Heart failure in her maternal grandfather; Thyroid disease in her maternal grandmother. There is no history of Stroke.  ROS:   Please see the history of present illness.    All other systems reviewed and are negative.  EKGs/Labs/Other Studies Reviewed:    The following studies were reviewed today:   EKG:  The ekg ordered  today demonstrates sinus rhythm.  Normal EKG.  Recent Labs: 03/28/2019: ALT 30; B Natriuretic Peptide 516.1; Magnesium 1.6 01/11/2020: TSH 0.66 01/29/2020: BUN 9; Creatinine, Ser 0.71; Hemoglobin 14.2; Platelets 272; Potassium 4.2; Sodium 141  Recent Lipid Panel    Component Value Date/Time   CHOL 188 03/31/2014 1227   TRIG 70.0 03/31/2014 1227   HDL 69.90 03/31/2014 1227   CHOLHDL 3 03/31/2014 1227   VLDL 14.0 03/31/2014 1227   LDLCALC 104 (H) 03/31/2014 1227    Physical Exam:    VS:  BP 120/72   Pulse 78   Ht 5\' 11"  (1.803 m)   Wt 185 lb 9.6 oz (84.2 kg)   SpO2 99%   BMI 25.89 kg/m     Wt Readings from Last 3 Encounters:  03/08/20 185 lb 9.6 oz (84.2 kg)  02/02/20 180 lb (81.6 kg)  01/13/20 185 lb (83.9 kg)     GEN:  Well nourished, well developed in no acute distress HEENT: Normal NECK: No JVD; No carotid bruits LYMPHATICS: No lymphadenopathy CARDIAC: RRR, no murmurs, rubs, gallops RESPIRATORY:  Clear to auscultation without rales, wheezing or rhonchi  ABDOMEN: Soft, non-tender, non-distended MUSCULOSKELETAL:  No edema; No deformity  SKIN: Warm and dry NEUROLOGIC:  Alert and oriented x 3 PSYCHIATRIC:  Normal affect   ASSESSMENT:    1. SVT (supraventricular tachycardia) (HCC)    PLAN:    In order of problems listed above:  1. SVT/typical AV nodal reentrant tachycardia Patient is doing well after EP study and ablation.  She has had no recurrence of her arrhythmia.  She has recently learned that she is pregnant.  We discussed the possibility that she could have other arrhythmias during pregnancy given the increased blood volume and stretch on the atrial walls.  I do not think this is likely however.  We will plan to follow-up as needed.    Follow-up as needed.   Medication Adjustments/Labs and Tests Ordered: Current medicines are reviewed at length with the patient today.  Concerns regarding medicines are outlined above.  Orders Placed This Encounter   Procedures  . EKG 12-Lead   No orders of the defined types were placed in this encounter.    Signed, Lars Mage, MD, [facility]  03/08/2020 4:56 PM    Electrophysiology [place]

## 2020-03-15 ENCOUNTER — Encounter: Payer: Self-pay | Admitting: Internal Medicine

## 2020-03-15 ENCOUNTER — Other Ambulatory Visit: Payer: Self-pay

## 2020-03-15 ENCOUNTER — Ambulatory Visit: Payer: BC Managed Care – PPO | Admitting: Internal Medicine

## 2020-03-15 VITALS — BP 124/82 | HR 90 | Ht 71.0 in | Wt 181.0 lb

## 2020-03-15 DIAGNOSIS — C73 Malignant neoplasm of thyroid gland: Secondary | ICD-10-CM

## 2020-03-15 DIAGNOSIS — E890 Postprocedural hypothyroidism: Secondary | ICD-10-CM

## 2020-03-15 LAB — TSH: TSH: 1.28 u[IU]/mL (ref 0.35–4.50)

## 2020-03-15 NOTE — Progress Notes (Signed)
Patient ID: Lauren Morton, female   DOB: [birth year], 35 y.o.   MRN: [id number]  This visit occurred during the SARS-CoV-2 public health emergency.  Safety protocols were in place, including screening questions prior to the visit, additional usage of staff PPE, and extensive cleaning of exam room while observing appropriate contact time as indicated for disinfecting solutions.   HPI  Lauren Morton is a 35 y.o.-year-old female, returning for f/u for papillary thyroid cancer and postsurgical hypothyroidism. Last visit 6 months ago.  Interim history: Before last visit, she gave birth by C-section on 07/15/2019. She has a healthy daughter  Lauren Morton).  She and her daughter is doing well. She is pregnant again, [redacted] weeks along. She has slight nausea, fatigue.  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  She had SVT ablation since last visit.  Palpitations resolved.  Reviewed her thyroid cancer history: Pt. has been dx with goiter in ~2006 >> thyroid U/S: ? Results but also dx'ed with Hashimoto's thyroiditis with hypothyroidism >> started levothyroxine.  She started to see Dr Danise Mina 03/2014 >> new U/S (04/2014): small R thyroid nodule 1.4 x 1.3 x 1.1 cm, with microcalcifications >>  PTC in 04/2014, by FNA.   - 06/03/2014: Total thyroidectomy -Dr. Harlow Asa. Path: Diagnosis 1. Thyroid, thyroidectomy, total thyroid, suture right superior pole - PAPILLARY THYROID CARCINOMA, 1.5 CM IN GREATEST DIMENSION. - MARGINS ARE NEGATIVE. - BACKGROUND FLORID LYMPHOCYTIC THYROIDITIS. - SEE ONCOLOGY TEMPLATE. 2. Lymph nodes, regional resection, central compartment - TEN BENIGN LYMPH NODES WITH NO TUMOR SEEN (0/10). Microscopic Comment 1. THYROID Specimen: Thyroid with central compartment lymph nodes. Procedure: Total thyroidectomy with limited central compartment lymph dissection. Specimen Integrity (intact/fragmented): Intact. Tumor focality: Unifocal. Dominant tumor: Maximum tumor size  (cm): 1.5 cm. Tumor laterality: Tumor involves right superior pole. Histologic type (including subtype and/or unique features as applicable): Papillary thyroid carcinoma, conventional type. Tumor capsule: Tumor is partially encapsulated. Extrathyroidal extension: No. Margins: Negative. Lymph - Vascular invasion: Definitive lymph/vascular is not identified. Capsular invasion with degree of invasion if present: No capsular invasion identified. Lymph nodes: # examined 10; # positive 0. TNM code: pT1b, pN0. Non-neoplastic thyroid: Florid lymphocytic thyroiditis. (RH:ds 06/04/14)  Neck U/S (12/28/2018): Isthmus: Surgically absent. There is no residual nodular soft tissue within the isthmic resection bed.  Right lobe: Surgically absent. There is fairly well-defined hypoechoic nodular soft tissue measuring approximately 0.5 x 0.4 x 1.0 cm within the right thyroid lobectomy resection bed.  Left lobe: Surgically absent. There is no residual nodular soft tissue within the left lobectomy resection bed.  Benign appearing non pathologically enlarged cervical lymph nodes are seen bilaterally.  Neck U/S (01/03/2018): Redemonstration surgical changes of thyroidectomy. No residual   thyroid tissue identified within the left or right thyroid bed.  Her thyroglobulin levels became undetectable at last check: Lab Results  Component Value Date   THYROGLB <0.1 (L) 09/16/2019   THYROGLB 0.3 (L) 06/05/2018   THYROGLB 0.2 (L) 12/12/2017   THYROGLB 0.4 (L) 03/01/2017   THYROGLB 0.3 (L) 01/25/2016   THYROGLB 0.8 (L) 02/18/2015   THYROGLB 0.8 (L) 08/18/2014   ATA antibodies are undetectable: Lab Results  Component Value Date   THGAB 1 09/16/2019   THGAB <1 06/05/2018   THGAB <1 12/12/2017   THGAB <1 03/01/2017   THGAB <1 01/25/2016   THGAB <1 02/18/2015   THGAB 1 08/18/2014   12/2015: Neck U/S 1 year after thyroidectomy:  Post total thyroidectomy with minimal amount (approximately 1 cm) of  residual nodular tissue within the right thyroid lobectomy resection bed potentially indicative of residual thyroid tissue though residual/locally recurrent disease is not excluded on the basis this examination. This residual tissue could be amenable to ultrasound-guided fine-needle aspiration as clinically indicated.  Postsurgical hypothyroidism:  Reviewed and addended history: In 12/2017, she was supposed to be on levothyroxine 125 mcg daily, however, the pharmacy refilled the previous prescription for 175 mcg daily and she was taking this for the months prior to our last visit.  Subsequently, TSH was suppressed.  We decreased the dose to 125 mcg daily and her TFTs normalized in 01/2018.  However, then, TSH was suppressed and we decreased the dose of her levothyroxine.  She did not return for repeat labs 1.5 months later...   In 12/2018, her TSH was normal, but above target for pregnancy so we increased her levothyroxine dose to 9 tablets of 112 mcg/week.  However, since then, she had several TSH levels that were much higher, unexplained.  I was not aware of that was done, as she had them checked when she presented to the ED with tachycardia.  On 05/04/2019, TSH was still high at 15.9.  TFTs remained uncontrolled due to missed levothyroxine doses.  We had to increase the levothyroxine dose to 200 mcg daily in 07/2019.  However, we could decrease the dose to 150 mcg daily 09/2019.  Patient takes the levothyroxine: - in am - fasting - at least 30 min from b'fast, then tea  - no Ca, Fe, + prenatal multivitamins at night - no PPIs - not on Biotin  We reviewed her TFTs: Lab Results  Component Value Date   TSH 0.66 01/11/2020   TSH 0.01 (L) 09/16/2019   TSH 8.64 (H) 07/08/2019   TSH 4.93 (H) 06/08/2019   TSH 15.91 (H) 05/04/2019   TSH 33.625 (H) 05/03/2019   TSH 14.071 (H) 03/28/2019   TSH 0.19 (L) 03/02/2019   TSH 5.22 (H) 01/07/2019   TSH 3.72 12/02/2018   FREET4 1.40 01/11/2020    FREET4 1.66 (H) 09/16/2019   FREET4 0.81 07/08/2019   FREET4 0.85 06/08/2019   FREET4 0.75 05/04/2019   FREET4 0.69 05/03/2019   FREET4 0.66 03/28/2019   FREET4 1.53 03/02/2019   FREET4 0.93 01/07/2019   FREET4 1.19 12/02/2018  01/10/2017: TSH 0.03 at OB/GYN office -we decreased the dose of levothyroxine at that time.  Pt denies: - feeling nodules in neck - hoarseness - dysphagia - choking - SOB with lying down  She has + FH of thyroid disorders in: MGM - Hashimoto's ds. No FH of thyroid cancer. No h/o radiation tx to head or neck.  No herbal supplements. No Biotin use. No recent steroids use.   ROS: Constitutional: no weight gain/no weight loss, + fatigue, no subjective hyperthermia, no subjective hypothermia Eyes: no blurry vision, no xerophthalmia ENT: no sore throat, + see HPI Cardiovascular: no CP/no SOB/+ palpitations/no leg swelling Respiratory: no cough/no SOB/no wheezing Gastrointestinal: + N/no V/no D/no C/no acid reflux Musculoskeletal: no muscle aches/no joint aches Skin: no rashes, no hair loss Neurological: no tremors/no numbness/no tingling/no dizziness  I reviewed pt's medications, allergies, PMH, social hx, family hx, and changes were documented in the history of present illness. Otherwise, unchanged from my initial visit note.  Past Medical History:  Diagnosis Date  . CKD (chronic kidney disease) stage 1, GFR 90 ml/min or greater 09/2013   mild proteinuria [place]) sees yearly  . Dysrhythmia    SVT during pregnancy  . Hashimoto's  thyroiditis    confirmed by biopsy s/p thyroidectomy  . Hypothyroidism, acquired, autoimmune   . Papillary thyroid carcinoma ([place]) 03/31/2014   s/p thyroidectomy, 10 negative LN   Past Surgical History:  Procedure Laterality Date  . CESAREAN SECTION N/A 07/15/2019   Procedure: FGHWEXH CESAREAN SECTION;  Surgeon: Everlene Farrier, MD;  Location: [facility];  Service: Obstetrics;  Laterality: N/A;  . LYMPH NODE DISSECTION N/A  06/03/2014   Procedure: LIMITED LYMPH NODE DISSECTION;  Surgeon: Armandina Gemma, MD;  Location: [facility];  Service: General;  Laterality: N/A;  . SVT ABLATION N/A 02/02/2020   Procedure: SVT ABLATION;  Surgeon: Vickie Epley, MD;  Location: [facility];  Service: Cardiovascular;  Laterality: N/A;  . THYROIDECTOMY N/A 06/03/2014   total - 1.5cm papillary thyroid carcinoma with negative surgical margins and 10/10 LN neg for mets; Armandina Gemma, MD  . WISDOM TOOTH EXTRACTION     4   Social History   Social History  . Marital Status: Married    Spouse Name: N/A  . Number of Children: 0   Occupational History  . School counselor   Social History Main Topics  . Smoking status: Never Smoker   . Smokeless tobacco: Never Used  . Alcohol Use: Yes     Comment: social use  . Drug Use: No   Social History Narrative   Caffeine: 1 soda at lunch   Lives with husband, 2 dogs and 1 pig, chickens   Occupation: Animal nutritionist   Edu: Masters in education   Activity: runs, has done 1/2 marathons   Diet: good water, good fruits/vegetables, red meat 2x/wk, fish 1x/wk   Current Outpatient Medications on File Prior to Visit  Medication Sig Dispense Refill  . levothyroxine (SYNTHROID) 150 MCG tablet Take 1 tablet (150 mcg total) by mouth daily before breakfast. 45 tablet 3  . Prenatal Vit-Fe Fumarate-FA (MULTIVITAMIN-PRENATAL) 27-0.8 MG TABS tablet Take 1 tablet by mouth daily in the afternoon.     No current facility-administered medications on file prior to visit.   No Known Allergies Family History  Problem Relation Age of Onset  . Cancer Mother 31       breast cancer, s/p mastectomy  . Diabetes Father   . Coronary artery disease Maternal Grandfather        several MIs  . Heart disease Maternal Grandfather   . Heart failure Maternal Grandfather   . Cancer Maternal Grandmother        breast cancer s/p lumpectomy  . Thyroid disease Maternal Grandmother   . Alcohol abuse Paternal  Grandmother   . Stroke Neg Hx    PE: BP 124/82   Pulse 90   Ht $R'5\' 11"'yc$  (1.803 m)   Wt 181 lb (82.1 kg)   SpO2 99%   BMI 25.24 kg/m  Body mass index is 25.24 kg/m. Wt Readings from Last 3 Encounters:  03/15/20 181 lb (82.1 kg)  03/08/20 185 lb 9.6 oz (84.2 kg)  02/02/20 180 lb (81.6 kg)   Constitutional: overweight, in NAD Eyes: PERRLA, EOMI, no exophthalmos ENT: moist mucous membranes, no neck masses palpable, no cervical lymphadenopathy Cardiovascular: RRR, No MRG Respiratory: CTA B Gastrointestinal: abdomen soft, NT, ND, BS+ Musculoskeletal: no deformities, strength intact in all 4 Skin: moist, warm, no rashes Neurological: no tremor with outstretched hands, DTR normal in all 4  ASSESSMENT: 1.  Papillary thyroid cancer (PTC) - see HPI  2. Postsurgical Hypothyroidism  PLAN:  1.  Papillary thyroid cancer -Patient  with a history of a 1.5 cm PTC cancer focus, for which she had total thyroidectomy but no RAI treatment due to the tumor size and the risk status.  We continue to follow her thyroglobulin levels and neck ultrasounds.  A neck ultrasound from 12/2015 showed a 1 cm right neck mass in the thyroid region.  A repeat neck ultrasound from 01/2018 did not show any masses in the neck so it was likely that the previous 1 cm mass was just an area of inflammation. -We reviewed together her latest thyroglobulin level and this was undetectable, which is excellent.  Her ATA antibodies were also undetectable. -She denies neck compression symptoms and I cannot feel masses with palpation of her neck -We will repeat the thyroglobulin and ATA antibodies after pregnancy  2. Patient with history of total thyroidectomy, on levothyroxine therapy, with uncontrolled hypothyroidism -In the past (including during her recent pregnancy) she had uncontrolled hypothyroidism due to missing levothyroxine doses.  We did discuss about the fact that taking levothyroxine correctly is critical for the  development of the baby and the pregnancy itself.  She gave birth to a healthy baby before our last visit.  She is now again pregnant so we discussed about increasing the dose of levothyroxine to match the pregnancy requirements. - latest thyroid labs reviewed with pt >> normal: Lab Results  Component Value Date   TSH 0.66 01/11/2020   - she is on LT4 150 mcg daily (dose decreased at last visit, 09/2019) >> we will increase the dose to 200 mcg daily - pt feels good on this dose. - we discussed about taking the thyroid hormone every day, with water, >30 minutes before breakfast, separated by >4 hours from acid reflux medications, calcium, iron, multivitamins. Pt. is taking it correctly. - will check thyroid tests today: TSH and fT4 -I also plan to recheck the test in 4 weeks after increasing the dose of levothyroxine -I then plan to see her back in 6 months.  Component     Latest Ref Rng & Units 03/15/2020  T4,Free(Direct)     0.60 - 1.60 ng/dL 1.10  TSH     0.35 - 4.50 uIU/mL 1.28  Excellent TFTs.  We will increase the levothyroxine dose for the pregnancy, as mentioned above.  Philemon Kingdom, MD PhD [facility]

## 2020-03-15 NOTE — Patient Instructions (Addendum)
Please increase the Levothyroxine to 200 mcg daily.  Take the thyroid hormone every day, with water, at least 30 minutes before breakfast, separated by at least 4 hours from: - acid reflux medications - calcium - iron - multivitamins  Please stop at the lab now and come back for labs in 4 weeks.  Please come back for a follow-up appointment in 6 months.

## 2020-03-16 LAB — T4, FREE: Free T4: 1.1 ng/dL (ref 0.60–1.60)

## 2020-03-22 MED ORDER — LEVOTHYROXINE SODIUM 200 MCG PO TABS: 200.0000 ug | ORAL_TABLET | Freq: Every day | ORAL | 3 refills | Status: DC

## 2020-03-25 LAB — OB RESULTS CONSOLE RPR: RPR: NONREACTIVE

## 2020-03-25 LAB — OB RESULTS CONSOLE ABO/RH: RH Type: POSITIVE

## 2020-03-25 LAB — OB RESULTS CONSOLE HEPATITIS B SURFACE ANTIGEN: Hepatitis B Surface Ag: NEGATIVE

## 2020-03-25 LAB — OB RESULTS CONSOLE RUBELLA ANTIBODY, IGM: Rubella: IMMUNE

## 2020-03-25 LAB — OB RESULTS CONSOLE HIV ANTIBODY (ROUTINE TESTING): HIV: NONREACTIVE

## 2020-03-25 LAB — OB RESULTS CONSOLE ANTIBODY SCREEN: Antibody Screen: NEGATIVE

## 2020-04-06 LAB — OB RESULTS CONSOLE GC/CHLAMYDIA
Chlamydia: NEGATIVE
Gonorrhea: NEGATIVE

## 2020-04-08 ENCOUNTER — Other Ambulatory Visit: Payer: Self-pay | Admitting: Internal Medicine

## 2020-04-08 DIAGNOSIS — E890 Postprocedural hypothyroidism: Secondary | ICD-10-CM

## 2020-04-13 ENCOUNTER — Other Ambulatory Visit: Payer: Self-pay

## 2020-04-13 ENCOUNTER — Other Ambulatory Visit: Payer: Self-pay | Admitting: Internal Medicine

## 2020-04-13 ENCOUNTER — Other Ambulatory Visit (INDEPENDENT_AMBULATORY_CARE_PROVIDER_SITE_OTHER): Payer: BC Managed Care – PPO

## 2020-04-13 DIAGNOSIS — E890 Postprocedural hypothyroidism: Secondary | ICD-10-CM

## 2020-04-13 LAB — T4, FREE: Free T4: 1.2 ng/dL (ref 0.60–1.60)

## 2020-04-13 LAB — TSH: TSH: 0.59 u[IU]/mL (ref 0.35–4.50)

## 2020-08-11 LAB — OB RESULTS CONSOLE HIV ANTIBODY (ROUTINE TESTING): HIV: NONREACTIVE

## 2020-09-05 ENCOUNTER — Encounter: Payer: Self-pay | Admitting: Family Medicine

## 2020-09-13 ENCOUNTER — Other Ambulatory Visit: Payer: Self-pay | Admitting: Obstetrics & Gynecology

## 2020-09-13 DIAGNOSIS — Z363 Encounter for antenatal screening for malformations: Secondary | ICD-10-CM

## 2020-09-15 ENCOUNTER — Other Ambulatory Visit: Payer: Self-pay

## 2020-09-15 ENCOUNTER — Ambulatory Visit: Payer: BC Managed Care – PPO | Admitting: *Deleted

## 2020-09-15 ENCOUNTER — Encounter: Payer: Self-pay | Admitting: *Deleted

## 2020-09-15 ENCOUNTER — Ambulatory Visit: Payer: BC Managed Care – PPO | Attending: Obstetrics & Gynecology

## 2020-09-15 ENCOUNTER — Other Ambulatory Visit: Payer: Self-pay | Admitting: *Deleted

## 2020-09-15 VITALS — BP 128/79 | HR 91

## 2020-09-15 DIAGNOSIS — O30043 Twin pregnancy, dichorionic/diamniotic, third trimester: Secondary | ICD-10-CM | POA: Insufficient documentation

## 2020-09-15 DIAGNOSIS — Z363 Encounter for antenatal screening for malformations: Secondary | ICD-10-CM | POA: Diagnosis present

## 2020-09-20 ENCOUNTER — Other Ambulatory Visit: Payer: Self-pay

## 2020-09-20 ENCOUNTER — Encounter: Payer: Self-pay | Admitting: Internal Medicine

## 2020-09-20 ENCOUNTER — Ambulatory Visit (INDEPENDENT_AMBULATORY_CARE_PROVIDER_SITE_OTHER): Payer: BC Managed Care – PPO | Admitting: Internal Medicine

## 2020-09-20 VITALS — BP 120/79 | HR 107 | Ht 71.0 in | Wt 205.8 lb

## 2020-09-20 DIAGNOSIS — C73 Malignant neoplasm of thyroid gland: Secondary | ICD-10-CM

## 2020-09-20 DIAGNOSIS — E890 Postprocedural hypothyroidism: Secondary | ICD-10-CM | POA: Diagnosis not present

## 2020-09-20 LAB — TSH: TSH: 35.06 u[IU]/mL — ABNORMAL HIGH (ref 0.35–5.50)

## 2020-09-20 LAB — T4, FREE: Free T4: 0.55 ng/dL — ABNORMAL LOW (ref 0.60–1.60)

## 2020-09-20 NOTE — Progress Notes (Signed)
Patient ID: Lauren Morton, female   DOB: 07-29-1985, 35 y.o.   MRN: [id number]  This visit occurred during the SARS-CoV-2 public health emergency.  Safety protocols were in place, including screening questions prior to the visit, additional usage of staff PPE, and extensive cleaning of exam room while observing appropriate contact time as indicated for disinfecting solutions.   HPI  Lauren Morton is a 35 y.o.-year-old female, returning for f/u for papillary thyroid cancer and postsurgical hypothyroidism. Last visit 6 months ago.  Interim history: Patient is pregnant at [redacted] weeks with twins.  She also has a healthy daughter born 07/2019 Lauren Morton). 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  She had SVT ablation since before last visit and her palpitations resolved. She is feeling well, without significant complaints.  She has occasional HAs - related to insufficient hydration. No acid reflux. She has a little leg swelling. No persistent back pain.  Reviewed her thyroid cancer history: Pt. has been dx with goiter in ~2006 >> thyroid U/S: ? Results but also dx'ed with Hashimoto's thyroiditis with hypothyroidism >> started levothyroxine.  She started to see Dr Danise Mina 03/2014 >> new U/S (04/2014): small R thyroid nodule 1.4 x 1.3 x 1.1 cm, with microcalcifications >>  PTC in 04/2014, by FNA.   - 06/03/2014: Total thyroidectomy -Dr. Harlow Asa. Path: Diagnosis 1. Thyroid, thyroidectomy, total thyroid, suture right superior pole - PAPILLARY THYROID CARCINOMA, 1.5 CM IN GREATEST DIMENSION. - MARGINS ARE NEGATIVE. - BACKGROUND FLORID LYMPHOCYTIC THYROIDITIS. - SEE ONCOLOGY TEMPLATE. 2. Lymph nodes, regional resection, central compartment - TEN BENIGN LYMPH NODES WITH NO TUMOR SEEN (0/10). Microscopic Comment 1. THYROID Specimen: Thyroid with central compartment lymph nodes. Procedure: Total thyroidectomy with limited central compartment lymph dissection. Specimen Integrity  (intact/fragmented): Intact. Tumor focality: Unifocal. Dominant tumor: Maximum tumor size (cm): 1.5 cm. Tumor laterality: Tumor involves right superior pole. Histologic type (including subtype and/or unique features as applicable): Papillary thyroid carcinoma, conventional type. Tumor capsule: Tumor is partially encapsulated. Extrathyroidal extension: No. Margins: Negative. Lymph - Vascular invasion: Definitive lymph/vascular is not identified. Capsular invasion with degree of invasion if present: No capsular invasion identified. Lymph nodes: # examined 10; # positive 0. TNM code: pT1b, pN0. Non-neoplastic thyroid: [place] lymphocytic thyroiditis. (RH:ds 06/04/14)  Neck U/S (12/28/2018): Isthmus: Surgically absent. There is no residual nodular soft tissue within the isthmic resection bed.  Right lobe: Surgically absent. There is fairly well-defined hypoechoic nodular soft tissue measuring approximately 0.5 x 0.4 x 1.0 cm within the right thyroid lobectomy resection bed.  Left lobe: Surgically absent. There is no residual nodular soft tissue within the left lobectomy resection bed.   Benign appearing non pathologically enlarged cervical lymph nodes are seen bilaterally.  Neck U/S (01/03/2018): Redemonstration surgical changes of thyroidectomy. No residual   thyroid tissue identified within the left or right thyroid bed.  Her thyroglobulin levels became undetectable at last check: Lab Results  Component Value Date   THYROGLB <0.1 (L) 09/16/2019   THYROGLB 0.3 (L) 06/05/2018   THYROGLB 0.2 (L) 12/12/2017   THYROGLB 0.4 (L) 03/01/2017   THYROGLB 0.3 (L) 01/25/2016   THYROGLB 0.8 (L) 02/18/2015   THYROGLB 0.8 (L) 08/18/2014   ATA antibodies are undetectable: Lab Results  Component Value Date   THGAB 1 09/16/2019   THGAB <1 06/05/2018   THGAB <1 12/12/2017   THGAB <1 03/01/2017   THGAB <1 01/25/2016   THGAB <1 02/18/2015   THGAB 1 08/18/2014   12/2015: Neck U/S 1 year  after thyroidectomy:  Post total thyroidectomy with minimal amount (approximately 1 cm) of residual nodular tissue within the right thyroid lobectomy resection bed potentially indicative of residual thyroid tissue though residual/locally recurrent disease is not excluded on the basis this examination. This residual tissue could be amenable to ultrasound-guided fine-needle aspiration as clinically indicated.  Postsurgical hypothyroidism:  Reviewed and addended history: In 12/2017, she was supposed to be on levothyroxine 125 mcg daily, however, the pharmacy refilled the previous prescription for 175 mcg daily and she was taking this for the months prior to our last visit.  Subsequently, TSH was suppressed.  We decreased the dose to 125 mcg daily and her TFTs normalized in 01/2018.  However, then, TSH was suppressed and we decreased the dose of her levothyroxine.  She did not return for repeat labs 1.5 months later...   In 12/2018, her TSH was normal, but above target for pregnancy so we increased her levothyroxine dose to 9 tablets of 112 mcg/week.  However, since then, she had several TSH levels that were much higher, unexplained.  I was not aware of that was done, as she had them checked when she presented to the ED with tachycardia.  On 05/04/2019, TSH was still high at 15.9.  TFTs remained uncontrolled due to missed levothyroxine doses.  We had to increase the levothyroxine dose to 200 mcg daily in 07/2019.  However, we could decrease the dose to 150 mcg daily 09/2019.  However, at last visit, she was again pregnant so we increased the dose to 200 mcg daily.  Patient takes the levothyroxine: - in am, did not miss doses - fasting - at least 30 min from b'fast - no Ca, Fe, + prenatal multivitamins at night - no PPIs - not on Biotin  We reviewed her TFTs: 3 weeks ago: TSH reportedly "high" at the ObGyn appt - no records Lab Results  Component Value Date   TSH 0.59 04/13/2020   TSH 1.28  03/15/2020   TSH 0.66 01/11/2020   TSH 0.01 (L) 09/16/2019   TSH 8.64 (H) 07/08/2019   TSH 4.93 (H) 06/08/2019   TSH 15.91 (H) 05/04/2019   TSH 33.625 (H) 05/03/2019   TSH 14.071 (H) 03/28/2019   TSH 0.19 (L) 03/02/2019   FREET4 1.20 04/13/2020   FREET4 1.10 03/15/2020   FREET4 1.40 01/11/2020   FREET4 1.66 (H) 09/16/2019   FREET4 0.81 07/08/2019   FREET4 0.85 06/08/2019   FREET4 0.75 05/04/2019   FREET4 0.69 05/03/2019   FREET4 0.66 03/28/2019   FREET4 1.53 03/02/2019  01/10/2017: TSH 0.03 at OB/GYN office -we decreased the dose of levothyroxine at that time.  Pt denies: - feeling nodules in neck - hoarseness - dysphagia - choking - SOB with lying down  She has + FH of thyroid disorders in: MGM - Hashimoto's ds. No FH of thyroid cancer. No h/o radiation tx to head or neck.  No herbal supplements. No Biotin use. No recent steroids use.   ROS: + See HPI  I reviewed pt's medications, allergies, PMH, social hx, family hx, and changes were documented in the history of present illness. Otherwise, unchanged from my initial visit note.  Past Medical History:  Diagnosis Date   AVNRT (AV nodal re-entry tachycardia) ([place]) 2021   CKD (chronic kidney disease) stage 1, GFR 90 ml/min or greater 09/2013   mild proteinuria [facility]) sees yearly   Dysrhythmia    SVT during pregnancy   Hashimoto's thyroiditis    confirmed by biopsy s/p thyroidectomy  Hypothyroidism, acquired, autoimmune    Papillary thyroid carcinoma ([place]) 03/31/2014   s/p thyroidectomy, 10 negative LN   Past Surgical History:  Procedure Laterality Date   CESAREAN SECTION N/A 07/15/2019   Procedure: FYBOFBP CESAREAN SECTION;  Surgeon: Everlene Farrier, MD;  Location: [place] [facility];  Service: Obstetrics;  Laterality: N/A;   LYMPH NODE DISSECTION N/A 06/03/2014   Procedure: LIMITED LYMPH NODE DISSECTION;  Surgeon: Armandina Gemma, MD;  Location: [facility];  Service: General;  Laterality: N/A;   SVT ABLATION N/A 02/02/2020    Procedure: SVT ABLATION;  Surgeon: Vickie Epley, MD;  Location: [place] [facility];  Service: Cardiovascular;  Laterality: N/A;   THYROIDECTOMY N/A 06/03/2014   total - 1.5cm papillary thyroid carcinoma with negative surgical margins and 10/10 LN neg for mets; Armandina Gemma, MD   WISDOM TOOTH EXTRACTION     4   Social History   Social History   Marital Status: Married    Spouse Name: N/A   Number of Children: 0   Occupational History   School counselor   Social History Main Topics   Smoking status: Never Smoker    Smokeless tobacco: Never Used   Alcohol Use: Yes     Comment: social use   Drug Use: No   Social History Narrative   Caffeine: 1 soda at lunch   Lives with husband, 2 dogs and 1 pig, chickens   Occupation: Animal nutritionist   Edu: Masters in education   Activity: runs, has done 1/2 marathons   Diet: good water, good fruits/vegetables, red meat 2x/wk, fish 1x/wk   Current Outpatient Medications on File Prior to Visit  Medication Sig Dispense Refill   levothyroxine (SYNTHROID) 200 MCG tablet Take 1 tablet (200 mcg total) by mouth daily before breakfast. (Patient taking differently: Take 150 mcg by mouth daily before breakfast.) 45 tablet 3   Prenatal Vit-Fe Fumarate-FA (MULTIVITAMIN-PRENATAL) 27-0.8 MG TABS tablet Take 1 tablet by mouth daily in the afternoon.     No current facility-administered medications on file prior to visit.   No Known Allergies Family History  Problem Relation Age of Onset   Cancer Mother 95       breast cancer, s/p mastectomy   Diabetes Father    Coronary artery disease Maternal Grandfather        several MIs   Heart disease Maternal Grandfather    Heart failure Maternal Grandfather    Cancer Maternal Grandmother        breast cancer s/p lumpectomy   Thyroid disease Maternal Grandmother    Alcohol abuse Paternal Grandmother    Stroke Neg Hx    PE: BP 120/79 (BP Location: Left Arm, Patient Position: Sitting, Cuff Size: Normal)    Pulse (!) 107   Ht _0  (1.803 m)   Wt 205 lb 12.8 oz (93.4 kg)   LMP 01/27/2020 (Exact Date)   SpO2 98%   BMI 28.70 kg/m  Body mass index is 28.7 kg/m. Wt Readings from Last 3 Encounters:  09/20/20 205 lb 12.8 oz (93.4 kg)  03/15/20 181 lb (82.1 kg)  03/08/20 185 lb 9.6 oz (84.2 kg)   Constitutional: Pregnant appearing, in NAD Eyes: PERRLA, EOMI, no exophthalmos ENT: moist mucous membranes, no neck masses palpable, no cervical lymphadenopathy Cardiovascular: tachycardia, RR, No MRG Respiratory: CTA B Gastrointestinal: abdomen soft, NT, ND, BS+ Musculoskeletal: no deformities, strength intact in all 4 Skin: moist, warm, no rashes Neurological: no tremor with outstretched hands, DTR normal in all 4  ASSESSMENT: 1.  Papillary thyroid cancer (PTC) - see HPI  2. Postsurgical Hypothyroidism  PLAN:  1.  Papillary thyroid cancer -Patient with a history of a 1.5 cm PTC cancer focus, for which she had total thyroidectomy but no RAI treatment due to the tumor size and the risk status.  We are following her with thyroglobulin levels and neck ultrasounds.  A neck ultrasound from 12/2015 showed a 1 cm right neck mass in the thyroid region but the repeat neck ultrasound from 01/2018 did not show any masses in the neck so it was likely that the previous 1 cm mass was just an area of inflammation. -Latest thyroglobulin level was undetectable.  Her ATA antibodies were also undetectable.  We will repeat these postpartum. -No neck compression symptoms and no masses felt on palpation of her neck  2. Patient with history of total thyroidectomy, on levothyroxine therapy, with uncontrolled hypothyroidism -History of uncontrolled hypothyroidism due to missing levothyroxine doses -Now with her second pregnancy-with twin girls! - latest thyroid labs reviewed with pt. >> normal at last visit: Lab Results  Component Value Date   TSH 0.59 04/13/2020  -However, she describes that she had another TSH  obtained by OB/GYN approximately 3 weeks ago and the TSH was elevated, but neither of [place] could locate these records... - she continues on LT4 200 mcg daily - pt feels good on this dose.  We did discuss that we most likely to decrease her dose after she gives birth next month.  We discussed about having her for labs 1 month after her C-section. - we discussed about taking the thyroid hormone every day, with water, >30 minutes before breakfast, separated by >4 hours from acid reflux medications, calcium, iron, multivitamins. Pt. is taking it correctly. - will check thyroid tests today: TSH and fT4  Component     Latest Ref Rng & Units 09/20/2020  TSH     0.35 - 5.50 uIU/mL 35.06 (H)  T4,Free(Direct)     0.60 - 1.60 ng/dL 0.55 (L)  TSH is very high.  She again admitted that she missed approximately 1 pill a week.  We did not increase the dose but I strongly advised him to take it consistently.  Philemon Kingdom, MD PhD [facility] Endocrinology

## 2020-09-20 NOTE — Patient Instructions (Addendum)
Please continue Levothyroxine 200 mcg daily.  Take the thyroid hormone every day, with water, at least 30 minutes before breakfast, separated by at least 4 hours from: - acid reflux medications - calcium - iron - multivitamins  Please stop at the lab.  Please come back for a follow-up appointment in 6 months but for labs in 4 weeks after you give birth.

## 2020-09-21 ENCOUNTER — Encounter: Payer: Self-pay | Admitting: *Deleted

## 2020-09-21 ENCOUNTER — Ambulatory Visit: Payer: BC Managed Care – PPO | Attending: Obstetrics

## 2020-09-21 ENCOUNTER — Ambulatory Visit: Payer: BC Managed Care – PPO | Admitting: *Deleted

## 2020-09-21 VITALS — BP 117/80 | HR 98

## 2020-09-21 DIAGNOSIS — Z3A34 34 weeks gestation of pregnancy: Secondary | ICD-10-CM

## 2020-09-21 DIAGNOSIS — O34219 Maternal care for unspecified type scar from previous cesarean delivery: Secondary | ICD-10-CM

## 2020-09-21 DIAGNOSIS — O30043 Twin pregnancy, dichorionic/diamniotic, third trimester: Secondary | ICD-10-CM

## 2020-09-21 DIAGNOSIS — O09523 Supervision of elderly multigravida, third trimester: Secondary | ICD-10-CM | POA: Diagnosis not present

## 2020-09-26 ENCOUNTER — Telehealth: Payer: Self-pay

## 2020-09-26 NOTE — Telephone Encounter (Addendum)
LVM for pt to call back to discuss results ----- Message from Philemon Kingdom, MD sent at 09/26/2020  4:55 PM EDT ----- Can you please call pt.:  Patient's TSH was recently very high, at 35 (she is pregnant).  I sent her a message to see if she missed any doses or if she took it incorrectly.  If not, we need to increase the dose of levothyroxine.  Can you please check with her, since she did not respond to my message.

## 2020-09-28 ENCOUNTER — Encounter: Payer: Self-pay | Admitting: *Deleted

## 2020-09-28 ENCOUNTER — Ambulatory Visit: Payer: BC Managed Care – PPO | Attending: Obstetrics

## 2020-09-28 ENCOUNTER — Ambulatory Visit: Payer: BC Managed Care – PPO | Admitting: *Deleted

## 2020-09-28 ENCOUNTER — Other Ambulatory Visit: Payer: Self-pay

## 2020-09-28 VITALS — BP 109/70 | HR 85

## 2020-09-28 DIAGNOSIS — Z3A35 35 weeks gestation of pregnancy: Secondary | ICD-10-CM | POA: Diagnosis not present

## 2020-09-28 DIAGNOSIS — O34219 Maternal care for unspecified type scar from previous cesarean delivery: Secondary | ICD-10-CM

## 2020-09-28 DIAGNOSIS — O09523 Supervision of elderly multigravida, third trimester: Secondary | ICD-10-CM

## 2020-09-28 DIAGNOSIS — O30043 Twin pregnancy, dichorionic/diamniotic, third trimester: Secondary | ICD-10-CM | POA: Insufficient documentation

## 2020-10-05 ENCOUNTER — Ambulatory Visit (HOSPITAL_BASED_OUTPATIENT_CLINIC_OR_DEPARTMENT_OTHER): Payer: BC Managed Care – PPO | Admitting: Maternal & Fetal Medicine

## 2020-10-05 ENCOUNTER — Ambulatory Visit: Payer: BC Managed Care – PPO | Admitting: *Deleted

## 2020-10-05 ENCOUNTER — Other Ambulatory Visit: Payer: Self-pay | Admitting: Obstetrics

## 2020-10-05 ENCOUNTER — Other Ambulatory Visit: Payer: Self-pay

## 2020-10-05 ENCOUNTER — Ambulatory Visit (HOSPITAL_BASED_OUTPATIENT_CLINIC_OR_DEPARTMENT_OTHER): Payer: BC Managed Care – PPO | Admitting: *Deleted

## 2020-10-05 ENCOUNTER — Ambulatory Visit: Payer: BC Managed Care – PPO | Attending: Obstetrics

## 2020-10-05 ENCOUNTER — Encounter: Payer: Self-pay | Admitting: *Deleted

## 2020-10-05 VITALS — BP 127/79 | HR 91

## 2020-10-05 DIAGNOSIS — O99283 Endocrine, nutritional and metabolic diseases complicating pregnancy, third trimester: Secondary | ICD-10-CM

## 2020-10-05 DIAGNOSIS — Z364 Encounter for antenatal screening for fetal growth retardation: Secondary | ICD-10-CM

## 2020-10-05 DIAGNOSIS — O09523 Supervision of elderly multigravida, third trimester: Secondary | ICD-10-CM | POA: Diagnosis not present

## 2020-10-05 DIAGNOSIS — O365912 Maternal care for other known or suspected poor fetal growth, first trimester, fetus 2: Secondary | ICD-10-CM | POA: Diagnosis present

## 2020-10-05 DIAGNOSIS — E039 Hypothyroidism, unspecified: Secondary | ICD-10-CM | POA: Insufficient documentation

## 2020-10-05 DIAGNOSIS — Z3A36 36 weeks gestation of pregnancy: Secondary | ICD-10-CM

## 2020-10-05 DIAGNOSIS — O30043 Twin pregnancy, dichorionic/diamniotic, third trimester: Secondary | ICD-10-CM | POA: Diagnosis present

## 2020-10-05 DIAGNOSIS — O365913 Maternal care for other known or suspected poor fetal growth, first trimester, fetus 3: Secondary | ICD-10-CM

## 2020-10-05 DIAGNOSIS — O34219 Maternal care for unspecified type scar from previous cesarean delivery: Secondary | ICD-10-CM | POA: Diagnosis not present

## 2020-10-05 NOTE — Progress Notes (Signed)
MFM brief note  Lauren Morton is a 35 yo G2P1 who is here at 31 w 0 day for follow up growth for diaminotic dichorionic twin pregnancy.  Today we observed new fetal growth restriction Twin A normal stomach, amniotic fluid, bladder, EFW 30%  and BPP 10/10- Maternal left, Cephalic Twin B normal stomach, amniotic fluid, bladder, EFW 3.6% and BPP 10/10- Maternal right, Cephalic UA Dopplers are normal without evidence of AEDF or REDF. Twin discordance is 17%  I discussed with Lauren Morton and her significant other today's finding of FGR in Twin B.  I explained that the etiology includes placental insufficiency, chronic disease, infection, aneuploidy and other genetic syndromes. She has a low risk NIPS. She has a history of hypothyroidism which her recent labs demonstrate poor control with at TSH of 35 and FT4 of 0.55. I explained that poorly controlle hypothryoidism is associated with fetal growth restriction. Per her providers notes they are taking steps to improve her thryoid function.  At this time I explained the diagnosis, evaluation and management to include on going weekly antenatal testing to include UA Dopplers.   Given that the EFW is 3.6% I recommend delivery by 37 weeks, she may recieve betamethasone if delivery is prior to that time.   She should also perform daily kick counts.  Recommendations  Delivery between 36-37 weeks but by 37 weeks.    I spent 30 minutes with > 50% in face to face consultation.  Keyatta Tolles Rolla Etienne, MD.

## 2020-10-05 NOTE — Procedures (Addendum)
Lauren Morton 1985-05-31 [redacted]w[redacted]d  Fetus A Non-Stress Test Interpretation for 10/05/20  Indication:  Di/Di twins  Fetal Heart Rate A Baseline Rate (A): 145 bpm Variability: Moderate Accelerations: 15 x 15 Decelerations: None Multiple birth?: Yes  Uterine Activity Mode: Palpation, Toco Contraction Frequency (min): UI Contraction Quality: Mild Resting Tone Palpated: Relaxed Resting Time: Adequate  Interpretation (Fetal Testing) Nonstress Test Interpretation: Reactive Comments: Dr. Gertie Exon reviewed tracing.  Lauren Morton 1985/03/26 [redacted]w[redacted]d   Fetus B Non-Stress Test Interpretation for 10/05/20  Indication:  IUGR  Fetal Heart Rate Fetus B Mode: External Baseline Rate (B): 150 BPM Variability: Moderate Accelerations: 15 x 15 Decelerations: None  Uterine Activity Mode: Palpation, Toco Contraction Frequency (min): UI Contraction Quality: Mild Resting Tone Palpated: Relaxed Resting Time: Adequate    Interpretation (Fetal Testing) Nonstress Test Interpretation: Reactive Comments: Dr. Gertie Exon reviewed tracing.

## 2020-10-06 ENCOUNTER — Telehealth (HOSPITAL_COMMUNITY): Payer: Self-pay | Admitting: *Deleted

## 2020-10-06 NOTE — Telephone Encounter (Signed)
Preadmission screen  

## 2020-10-06 NOTE — Patient Instructions (Signed)
Lauren Morton  10/06/2020   Your procedure is scheduled on:  10/16/2020  Arrive at 0800 at Entrance C on [address] at [facility]  and [address]. You are invited to use the FREE valet parking or use the Visitor's parking deck.  Pick up the phone at the desk and dial [phone].  Call this number if you have problems the morning of surgery: [phone]  Remember:   Do not eat food:(After Midnight) Desps de medianoche.  Do not drink clear liquids: (After Midnight) Desps de medianoche.  Take these medicines the morning of surgery with A SIP OF WATER:  Take levothyroxine as prescribed   Do not wear jewelry, make-up or nail polish.  Do not wear lotions, powders, or perfumes. Do not wear deodorant.  Do not shave 48 hours prior to surgery.  Do not bring valuables to the hospital.  [facility] is not   responsible for any belongings or valuables brought to the hospital.  Contacts, dentures or bridgework may not be worn into surgery.  Leave suitcase in the car. After surgery it may be brought to your room.  For patients admitted to the hospital, checkout time is 11:00 AM the day of              discharge.      Please read over the following fact sheets that you were given:     Preparing for Surgery

## 2020-10-07 ENCOUNTER — Telehealth (HOSPITAL_COMMUNITY): Payer: Self-pay | Admitting: *Deleted

## 2020-10-07 NOTE — Telephone Encounter (Signed)
Preadmission screen  

## 2020-10-10 ENCOUNTER — Other Ambulatory Visit: Payer: Self-pay | Admitting: Obstetrics

## 2020-10-10 DIAGNOSIS — O30043 Twin pregnancy, dichorionic/diamniotic, third trimester: Secondary | ICD-10-CM

## 2020-10-12 ENCOUNTER — Inpatient Hospital Stay (HOSPITAL_COMMUNITY)
Admission: AD | Admit: 2020-10-12 | Discharge: 2020-10-15 | DRG: 787 | Disposition: A | Discharge status: Home / Self Care | Payer: BC Managed Care – PPO | Attending: Obstetrics and Gynecology | Admitting: Obstetrics and Gynecology

## 2020-10-12 ENCOUNTER — Encounter: Payer: Self-pay | Admitting: *Deleted

## 2020-10-12 ENCOUNTER — Encounter (HOSPITAL_COMMUNITY): Payer: Self-pay | Admitting: Obstetrics and Gynecology

## 2020-10-12 ENCOUNTER — Inpatient Hospital Stay (HOSPITAL_COMMUNITY): Payer: BC Managed Care – PPO | Admitting: Anesthesiology

## 2020-10-12 ENCOUNTER — Encounter (HOSPITAL_COMMUNITY)
Admission: AD | Disposition: A | Discharge status: Home / Self Care | Payer: Self-pay | Source: Home / Self Care | Attending: Obstetrics and Gynecology

## 2020-10-12 ENCOUNTER — Ambulatory Visit (HOSPITAL_BASED_OUTPATIENT_CLINIC_OR_DEPARTMENT_OTHER): Payer: BC Managed Care – PPO

## 2020-10-12 ENCOUNTER — Other Ambulatory Visit: Payer: Self-pay

## 2020-10-12 ENCOUNTER — Ambulatory Visit: Payer: BC Managed Care – PPO | Admitting: *Deleted

## 2020-10-12 ENCOUNTER — Other Ambulatory Visit: Payer: Self-pay | Admitting: Obstetrics

## 2020-10-12 VITALS — BP 127/86 | HR 76

## 2020-10-12 DIAGNOSIS — O365932 Maternal care for other known or suspected poor fetal growth, third trimester, fetus 2: Secondary | ICD-10-CM | POA: Diagnosis present

## 2020-10-12 DIAGNOSIS — E039 Hypothyroidism, unspecified: Secondary | ICD-10-CM | POA: Diagnosis present

## 2020-10-12 DIAGNOSIS — O09523 Supervision of elderly multigravida, third trimester: Secondary | ICD-10-CM | POA: Insufficient documentation

## 2020-10-12 DIAGNOSIS — O99284 Endocrine, nutritional and metabolic diseases complicating childbirth: Secondary | ICD-10-CM | POA: Diagnosis present

## 2020-10-12 DIAGNOSIS — O4103X2 Oligohydramnios, third trimester, fetus 2: Secondary | ICD-10-CM | POA: Diagnosis present

## 2020-10-12 DIAGNOSIS — O30043 Twin pregnancy, dichorionic/diamniotic, third trimester: Secondary | ICD-10-CM | POA: Diagnosis present

## 2020-10-12 DIAGNOSIS — O365990 Maternal care for other known or suspected poor fetal growth, unspecified trimester, not applicable or unspecified: Secondary | ICD-10-CM | POA: Diagnosis present

## 2020-10-12 DIAGNOSIS — O34219 Maternal care for unspecified type scar from previous cesarean delivery: Secondary | ICD-10-CM

## 2020-10-12 DIAGNOSIS — O99283 Endocrine, nutritional and metabolic diseases complicating pregnancy, third trimester: Secondary | ICD-10-CM | POA: Diagnosis not present

## 2020-10-12 DIAGNOSIS — O365930 Maternal care for other known or suspected poor fetal growth, third trimester, not applicable or unspecified: Secondary | ICD-10-CM | POA: Diagnosis present

## 2020-10-12 DIAGNOSIS — Z98891 History of uterine scar from previous surgery: Secondary | ICD-10-CM

## 2020-10-12 DIAGNOSIS — Z3A37 37 weeks gestation of pregnancy: Secondary | ICD-10-CM

## 2020-10-12 DIAGNOSIS — Z20822 Contact with and (suspected) exposure to covid-19: Secondary | ICD-10-CM | POA: Diagnosis present

## 2020-10-12 DIAGNOSIS — O34211 Maternal care for low transverse scar from previous cesarean delivery: Secondary | ICD-10-CM | POA: Diagnosis present

## 2020-10-12 LAB — CBC
HCT: 33.1 % — ABNORMAL LOW (ref 36.0–46.0)
Hemoglobin: 10.6 g/dL — ABNORMAL LOW (ref 12.0–15.0)
MCH: 28.4 pg (ref 26.0–34.0)
MCHC: 32 g/dL (ref 30.0–36.0)
MCV: 88.7 fL (ref 80.0–100.0)
Platelets: 209 10*3/uL (ref 150–400)
RBC: 3.73 MIL/uL — ABNORMAL LOW (ref 3.87–5.11)
RDW: 14.4 % (ref 11.5–15.5)
WBC: 7.6 10*3/uL (ref 4.0–10.5)
nRBC: 0 % (ref 0.0–0.2)

## 2020-10-12 LAB — COMPREHENSIVE METABOLIC PANEL
ALT: 18 U/L (ref 0–44)
AST: 23 U/L (ref 15–41)
Albumin: 2.2 g/dL — ABNORMAL LOW (ref 3.5–5.0)
Alkaline Phosphatase: 125 U/L (ref 38–126)
Anion gap: 8 (ref 5–15)
BUN: 7 mg/dL (ref 6–20)
CO2: 20 mmol/L — ABNORMAL LOW (ref 22–32)
Calcium: 8.2 mg/dL — ABNORMAL LOW (ref 8.9–10.3)
Chloride: 108 mmol/L (ref 98–111)
Creatinine, Ser: 0.62 mg/dL (ref 0.44–1.00)
GFR, Estimated: >60 mL/min (ref >60)
Glucose, Bld: 73 mg/dL (ref 70–99)
Potassium: 3.8 mmol/L (ref 3.5–5.1)
Sodium: 136 mmol/L (ref 135–145)
Total Bilirubin: 0.5 mg/dL (ref 0.3–1.2)
Total Protein: 5 g/dL — ABNORMAL LOW (ref 6.5–8.1)

## 2020-10-12 LAB — TYPE AND SCREEN
ABO/RH(D): A POS
Antibody Screen: NEGATIVE

## 2020-10-12 LAB — RESP PANEL BY RT-PCR (FLU A&B, COVID) ARPGX2
Influenza A by PCR: NEGATIVE
Influenza B by PCR: NEGATIVE
SARS Coronavirus 2 by RT PCR: NEGATIVE

## 2020-10-12 SURGERY — Surgical Case
Anesthesia: Spinal | Wound class: Clean Contaminated

## 2020-10-12 MED ORDER — DIBUCAINE (PERIANAL) 1 % EX OINT: 1.0000 "application " | TOPICAL_OINTMENT | CUTANEOUS | Status: DC | PRN

## 2020-10-12 MED ORDER — PHENYLEPHRINE HCL-NACL 20-0.9 MG/250ML-% IV SOLN
INTRAVENOUS | Status: DC | PRN
  Administered 2020-10-12: 60 ug/min via INTRAVENOUS

## 2020-10-12 MED ORDER — SENNOSIDES-DOCUSATE SODIUM 8.6-50 MG PO TABS
2.0000 | ORAL_TABLET | Freq: Every day | ORAL | Status: DC
  Administered 2020-10-13 – 2020-10-15 (×3): 2 via ORAL
  Filled 2020-10-12 (×6): qty 2

## 2020-10-12 MED ORDER — NALOXONE HCL 0.4 MG/ML IJ SOLN: 0.4000 mg | INTRAMUSCULAR | Status: DC | PRN

## 2020-10-12 MED ORDER — PHENYLEPHRINE HCL-NACL 20-0.9 MG/250ML-% IV SOLN
INTRAVENOUS | Status: AC
  Filled 2020-10-12: qty 250

## 2020-10-12 MED ORDER — IBUPROFEN 600 MG PO TABS
600.0000 mg | ORAL_TABLET | Freq: Four times a day (QID) | ORAL | Status: DC
  Administered 2020-10-12 – 2020-10-15 (×9): 600 mg via ORAL
  Filled 2020-10-12 (×11): qty 1

## 2020-10-12 MED ORDER — SCOPOLAMINE 1 MG/3DAYS TD PT72: 1.0000 | MEDICATED_PATCH | Freq: Once | TRANSDERMAL | Status: DC

## 2020-10-12 MED ORDER — OXYTOCIN-SODIUM CHLORIDE 30-0.9 UT/500ML-% IV SOLN
INTRAVENOUS | Status: DC | PRN
  Administered 2020-10-12: 30 [IU] via INTRAVENOUS

## 2020-10-12 MED ORDER — NALBUPHINE HCL 10 MG/ML IJ SOLN: 5.0000 mg | INTRAMUSCULAR | Status: DC | PRN

## 2020-10-12 MED ORDER — KETOROLAC TROMETHAMINE 30 MG/ML IJ SOLN: 30.0000 mg | Freq: Four times a day (QID) | INTRAMUSCULAR | Status: AC | PRN

## 2020-10-12 MED ORDER — FENTANYL CITRATE (PF) 100 MCG/2ML IJ SOLN
INTRAMUSCULAR | Status: AC
  Filled 2020-10-12: qty 2

## 2020-10-12 MED ORDER — DIPHENHYDRAMINE HCL 50 MG/ML IJ SOLN: 12.5000 mg | INTRAMUSCULAR | Status: DC | PRN

## 2020-10-12 MED ORDER — ACETAMINOPHEN 500 MG PO TABS
1000.0000 mg | ORAL_TABLET | Freq: Four times a day (QID) | ORAL | Status: AC
  Administered 2020-10-12 – 2020-10-13 (×3): 1000 mg via ORAL
  Filled 2020-10-12 (×3): qty 2

## 2020-10-12 MED ORDER — PHENYLEPHRINE 40 MCG/ML (10ML) SYRINGE FOR IV PUSH (FOR BLOOD PRESSURE SUPPORT)
PREFILLED_SYRINGE | INTRAVENOUS | Status: AC
  Filled 2020-10-12: qty 10

## 2020-10-12 MED ORDER — CEFAZOLIN SODIUM-DEXTROSE 2-4 GM/100ML-% IV SOLN
2.0000 g | INTRAVENOUS | Status: AC
  Administered 2020-10-12: 2 g via INTRAVENOUS

## 2020-10-12 MED ORDER — PRENATAL MULTIVITAMIN CH
1.0000 | ORAL_TABLET | Freq: Every day | ORAL | Status: DC
  Administered 2020-10-13 – 2020-10-15 (×2): 1 via ORAL
  Filled 2020-10-12 (×4): qty 1

## 2020-10-12 MED ORDER — NALBUPHINE HCL 10 MG/ML IJ SOLN: 5.0000 mg | Freq: Once | INTRAMUSCULAR | Status: DC | PRN

## 2020-10-12 MED ORDER — MORPHINE SULFATE (PF) 0.5 MG/ML IJ SOLN: INTRAMUSCULAR | Status: DC | PRN

## 2020-10-12 MED ORDER — OXYTOCIN-SODIUM CHLORIDE 30-0.9 UT/500ML-% IV SOLN
INTRAVENOUS | Status: AC
  Filled 2020-10-12: qty 500

## 2020-10-12 MED ORDER — OXYCODONE HCL 5 MG PO TABS
5.0000 mg | ORAL_TABLET | ORAL | Status: DC | PRN
Start: 2020-10-12 — End: 2020-10-15

## 2020-10-12 MED ORDER — DEXAMETHASONE SODIUM PHOSPHATE 10 MG/ML IJ SOLN
INTRAMUSCULAR | Status: DC | PRN
  Administered 2020-10-12: 10 mg via INTRAVENOUS

## 2020-10-12 MED ORDER — SIMETHICONE 80 MG PO CHEW
80.0000 mg | CHEWABLE_TABLET | Freq: Three times a day (TID) | ORAL | Status: DC
  Administered 2020-10-13 – 2020-10-15 (×5): 80 mg via ORAL
  Filled 2020-10-12 (×7): qty 1

## 2020-10-12 MED ORDER — DIPHENHYDRAMINE HCL 25 MG PO CAPS: 25.0000 mg | ORAL_CAPSULE | ORAL | Status: DC | PRN

## 2020-10-12 MED ORDER — POVIDONE-IODINE 10 % EX SWAB: 2.0000 "application " | Freq: Once | CUTANEOUS | Status: DC

## 2020-10-12 MED ORDER — LACTATED RINGERS IV SOLN: INTRAVENOUS | Status: DC | PRN

## 2020-10-12 MED ORDER — PHENYLEPHRINE HCL (PRESSORS) 10 MG/ML IV SOLN
INTRAVENOUS | Status: DC | PRN
  Administered 2020-10-12: 200 ug via INTRAVENOUS
  Administered 2020-10-12: 80 ug via INTRAVENOUS

## 2020-10-12 MED ORDER — MENTHOL 3 MG MT LOZG: 1.0000 | LOZENGE | OROMUCOSAL | Status: DC | PRN

## 2020-10-12 MED ORDER — FENTANYL CITRATE (PF) 100 MCG/2ML IJ SOLN: 25.0000 ug | INTRAMUSCULAR | Status: DC | PRN

## 2020-10-12 MED ORDER — SOD CITRATE-CITRIC ACID 500-334 MG/5ML PO SOLN: 30.0000 mL | ORAL | Status: DC

## 2020-10-12 MED ORDER — ONDANSETRON HCL 4 MG/2ML IJ SOLN
INTRAMUSCULAR | Status: AC
  Filled 2020-10-12: qty 2

## 2020-10-12 MED ORDER — KETOROLAC TROMETHAMINE 30 MG/ML IJ SOLN
INTRAMUSCULAR | Status: AC
  Filled 2020-10-12: qty 1

## 2020-10-12 MED ORDER — WITCH HAZEL-GLYCERIN EX PADS: 1.0000 "application " | MEDICATED_PAD | CUTANEOUS | Status: DC | PRN

## 2020-10-12 MED ORDER — OXYTOCIN-SODIUM CHLORIDE 30-0.9 UT/500ML-% IV SOLN: 2.5000 [IU]/h | INTRAVENOUS | Status: AC

## 2020-10-12 MED ORDER — ZOLPIDEM TARTRATE 5 MG PO TABS: 5.0000 mg | ORAL_TABLET | Freq: Every evening | ORAL | Status: DC | PRN

## 2020-10-12 MED ORDER — DIPHENHYDRAMINE HCL 25 MG PO CAPS
25.0000 mg | ORAL_CAPSULE | Freq: Four times a day (QID) | ORAL | Status: DC | PRN
  Administered 2020-10-12 – 2020-10-13 (×2): 25 mg via ORAL
  Filled 2020-10-12 (×3): qty 1

## 2020-10-12 MED ORDER — MORPHINE SULFATE (PF) 0.5 MG/ML IJ SOLN
INTRAMUSCULAR | Status: DC | PRN
  Administered 2020-10-12: .15 mg via INTRATHECAL

## 2020-10-12 MED ORDER — SIMETHICONE 80 MG PO CHEW
80.0000 mg | CHEWABLE_TABLET | ORAL | Status: DC | PRN
  Administered 2020-10-13 – 2020-10-14 (×4): 80 mg via ORAL
  Filled 2020-10-12 (×4): qty 1

## 2020-10-12 MED ORDER — SODIUM CHLORIDE 0.9% FLUSH: 3.0000 mL | INTRAVENOUS | Status: DC | PRN

## 2020-10-12 MED ORDER — ONDANSETRON HCL 4 MG/2ML IJ SOLN
INTRAMUSCULAR | Status: DC | PRN
  Administered 2020-10-12: 4 mg via INTRAVENOUS

## 2020-10-12 MED ORDER — FENTANYL CITRATE (PF) 100 MCG/2ML IJ SOLN
INTRAMUSCULAR | Status: DC | PRN
  Administered 2020-10-12: 15 ug via INTRATHECAL

## 2020-10-12 MED ORDER — KETOROLAC TROMETHAMINE 30 MG/ML IJ SOLN
30.0000 mg | Freq: Four times a day (QID) | INTRAMUSCULAR | Status: AC | PRN
  Administered 2020-10-12: 30 mg via INTRAMUSCULAR

## 2020-10-12 MED ORDER — DEXAMETHASONE SODIUM PHOSPHATE 10 MG/ML IJ SOLN
INTRAMUSCULAR | Status: AC
  Filled 2020-10-12: qty 1

## 2020-10-12 MED ORDER — CEFAZOLIN SODIUM-DEXTROSE 2-4 GM/100ML-% IV SOLN
INTRAVENOUS | Status: AC
  Filled 2020-10-12: qty 100

## 2020-10-12 MED ORDER — MORPHINE SULFATE (PF) 0.5 MG/ML IJ SOLN
INTRAMUSCULAR | Status: AC
  Filled 2020-10-12: qty 10

## 2020-10-12 MED ORDER — NALOXONE HCL 4 MG/10ML IJ SOLN
1.0000 ug/kg/h | INTRAVENOUS | Status: DC | PRN
  Filled 2020-10-12: qty 5

## 2020-10-12 MED ORDER — TETANUS-DIPHTH-ACELL PERTUSSIS 5-2.5-18.5 LF-MCG/0.5 IM SUSY: 0.5000 mL | PREFILLED_SYRINGE | Freq: Once | INTRAMUSCULAR | Status: DC

## 2020-10-12 MED ORDER — ONDANSETRON HCL 4 MG/2ML IJ SOLN: 4.0000 mg | Freq: Three times a day (TID) | INTRAMUSCULAR | Status: DC | PRN

## 2020-10-12 MED ORDER — ACETAMINOPHEN 10 MG/ML IV SOLN: 1000.0000 mg | Freq: Once | INTRAVENOUS | Status: DC | PRN

## 2020-10-12 MED ORDER — ACETAMINOPHEN 325 MG PO TABS
650.0000 mg | ORAL_TABLET | ORAL | Status: DC | PRN
  Administered 2020-10-14: 650 mg via ORAL
  Filled 2020-10-12: qty 2

## 2020-10-12 MED ORDER — BUPIVACAINE IN DEXTROSE 0.75-8.25 % IT SOLN
INTRATHECAL | Status: DC | PRN
  Administered 2020-10-12: 1.6 mL via INTRATHECAL

## 2020-10-12 MED ORDER — COCONUT OIL OIL
1.0000 "application " | TOPICAL_OIL | Status: DC | PRN
  Administered 2020-10-15: 1 via TOPICAL

## 2020-10-12 SURGICAL SUPPLY — 35 items
APL SKNCLS STERI-STRIP NONHPOA (GAUZE/BANDAGES/DRESSINGS) ×1
BARRIER ADHS 3X4 INTERCEED (GAUZE/BANDAGES/DRESSINGS) IMPLANT
BENZOIN TINCTURE PRP APPL 2/3 (GAUZE/BANDAGES/DRESSINGS) ×1 IMPLANT
BRR ADH 4X3 ABS CNTRL BYND (GAUZE/BANDAGES/DRESSINGS)
CHLORAPREP W/TINT 26ML (MISCELLANEOUS) ×2 IMPLANT
CLAMP CORD UMBIL (MISCELLANEOUS) IMPLANT
CLOTH BEACON ORANGE TIMEOUT ST (SAFETY) ×2 IMPLANT
DRSG OPSITE POSTOP 4X10 (GAUZE/BANDAGES/DRESSINGS) ×2 IMPLANT
ELECT REM PT RETURN 9FT ADLT (ELECTROSURGICAL) ×2
ELECTRODE REM PT RTRN 9FT ADLT (ELECTROSURGICAL) ×1 IMPLANT
EXTRACTOR VACUUM M CUP 4 TUBE (SUCTIONS) IMPLANT
GLOVE BIO SURGEON STRL SZ 6.5 (GLOVE) ×2 IMPLANT
GLOVE BIOGEL PI IND STRL 7.0 (GLOVE) ×1 IMPLANT
GLOVE BIOGEL PI INDICATOR 7.0 (GLOVE) ×1
GOWN STRL REUS W/TWL LRG LVL3 (GOWN DISPOSABLE) ×4 IMPLANT
KIT ABG SYR 3ML LUER SLIP (SYRINGE) IMPLANT
NDL HYPO 25X5/8 SAFETYGLIDE (NEEDLE) ×1 IMPLANT
NEEDLE HYPO 22GX1.5 SAFETY (NEEDLE) IMPLANT
NEEDLE HYPO 25X5/8 SAFETYGLIDE (NEEDLE) ×2 IMPLANT
NS IRRIG 1000ML POUR BTL (IV SOLUTION) ×2 IMPLANT
PACK C SECTION WH (CUSTOM PROCEDURE TRAY) ×2 IMPLANT
PAD OB MATERNITY 4.3X12.25 (PERSONAL CARE ITEMS) ×2 IMPLANT
PENCIL SMOKE EVAC W/HOLSTER (ELECTROSURGICAL) ×2 IMPLANT
STRIP CLOSURE SKIN 1/2X4 (GAUZE/BANDAGES/DRESSINGS) ×1 IMPLANT
SUT CHROMIC 0 CTX 36 (SUTURE) ×4 IMPLANT
SUT PLAIN 0 NONE (SUTURE) IMPLANT
SUT PLAIN 2 0 XLH (SUTURE) IMPLANT
SUT VIC AB 0 CT1 27 (SUTURE) ×6
SUT VIC AB 0 CT1 27XBRD ANBCTR (SUTURE) ×3 IMPLANT
SUT VIC AB 4-0 KS 27 (SUTURE) IMPLANT
SYR CONTROL 10ML LL (SYRINGE) IMPLANT
TOWEL OR 17X24 6PK STRL BLUE (TOWEL DISPOSABLE) ×2 IMPLANT
TRAY FOLEY W/BAG SLVR 14FR LF (SET/KITS/TRAYS/PACK) ×2 IMPLANT
VACUUM CUP M-STYLE MYSTIC II (SUCTIONS) IMPLANT
WATER STERILE IRR 1000ML POUR (IV SOLUTION) ×2 IMPLANT

## 2020-10-12 NOTE — Brief Op Note (Signed)
10/12/2020  6:10 PM  PATIENT:  Lauren Morton  35 y.o. female  PRE-OPERATIVE DIAGNOSIS:   IUP at 52 weeks Di/Di Twins IUGR of TWIN B with oligohydramnios Previous Cesarean Section   POST-OPERATIVE DIAGNOSIS:   Same  PROCEDURE:  Procedure(s): CESAREAN SECTION MULTI-GESTATIONAL (N/A) Repeat LTCS  SURGEON:  Surgeon(s) and Role:    * Dian Queen, MD - Primary  PHYSICIAN ASSISTANT:   ASSISTANTS: none   ANESTHESIA:   spinal  EBL:  per anesthesia record    BLOOD ADMINISTERED:none  DRAINS: Urinary Catheter (Foley)   LOCAL MEDICATIONS USED:  NONE  SPECIMEN:  Source of Specimen:  placentas  DISPOSITION OF SPECIMEN:  PATHOLOGY  COUNTS:  YES  TOURNIQUET:  * No tourniquets in log *  DICTATION: .Other Dictation: Dictation Number dictated  PLAN OF CARE: Admit to inpatient   PATIENT DISPOSITION:  PACU - hemodynamically stable.   Delay start of Pharmacological VTE agent (>24hrs) due to surgical blood loss or risk of bleeding: not applicable

## 2020-10-12 NOTE — H&P (Signed)
Lauren Morton is a 35 y.o. G 2 P 1001 at 63 weeks with Twins  In MFM today, Baby B IUGR and BPP 6/8 with low amniotic fluid. Dr Lauren Morton called me and recommended delivery today. OB History     Gravida  2   Para  1   Term  1   Preterm      AB      Living  1      SAB      IAB      Ectopic      Multiple  0   Live Births  1          Past Medical History:  Diagnosis Date   AVNRT (AV nodal re-entry tachycardia) ([place]) 2021   CKD (chronic kidney disease) stage 1, GFR 90 ml/min or greater 09/2013   mild proteinuria Lauren Morton) sees yearly   Dysrhythmia    SVT during pregnancy   Hashimoto's thyroiditis    confirmed by biopsy s/p thyroidectomy   Hypothyroidism, acquired, autoimmune    Papillary thyroid carcinoma ([place]) 03/31/2014   s/p thyroidectomy, 10 negative LN   Past Surgical History:  Procedure Laterality Date   CESAREAN SECTION N/A 07/15/2019   Procedure: [id number] CESAREAN SECTION;  Surgeon: Lauren Farrier, MD;  Location: [facility];  Service: Obstetrics;  Laterality: N/A;   LYMPH NODE DISSECTION N/A 06/03/2014   Procedure: LIMITED LYMPH NODE DISSECTION;  Surgeon: Lauren Gemma, MD;  Location: [facility];  Service: General;  Laterality: N/A;   SVT ABLATION N/A 02/02/2020   Procedure: SVT ABLATION;  Surgeon: Lauren Epley, MD;  Location: [place] CV LAB;  Service: Cardiovascular;  Laterality: N/A;   THYROIDECTOMY N/A 06/03/2014   total - 1.5cm papillary thyroid carcinoma with negative surgical margins and 10/10 LN neg for mets; Lauren Gemma, MD   WISDOM TOOTH EXTRACTION     4   Family History: family history includes Alcohol abuse in her paternal grandmother; Cancer in her maternal grandmother; Cancer (age of onset: 50) in her mother; Coronary artery disease in her maternal grandfather; Diabetes in her father; Heart disease in her maternal grandfather; Heart failure in her maternal grandfather; Thyroid disease in her maternal grandmother. Social History:  reports that  she has never smoked. She has never used smokeless tobacco. She reports that she does not currently use alcohol. She reports that she does not use drugs.     Maternal Diabetes: No Genetic Screening: Normal Maternal Ultrasounds/Referrals: IUGR Fetal Ultrasounds or other Referrals:  Referred to Materal Fetal Medicine  Maternal Substance Abuse:  No Significant Maternal Medications:  None Significant Maternal Lab Results:  None Other Comments:  None  Review of Systems  All other systems reviewed and are negative. History   Blood pressure 124/81, pulse 93, temperature 98.2 F (36.8 C), temperature source Oral, resp. rate 18, height 5\' 11"  (1.803 m), weight 95.3 kg, last menstrual period 01/27/2020, not currently breastfeeding. Exam Physical Exam Vitals and nursing note reviewed. Exam conducted with a chaperone present.  Constitutional:      Appearance: Normal appearance.  Cardiovascular:     Rate and Rhythm: Normal rate and regular rhythm.     Pulses: Normal pulses.  Pulmonary:     Effort: Pulmonary effort is normal.  Musculoskeletal:     Cervical back: Normal range of motion.  Neurological:     Mental Status: She is alert.    Prenatal labs: ABO, Rh: A/Positive/-- (03/25 0000) Antibody: Negative (03/25 0000) Rubella: Immune (03/25 0000) RPR:  Nonreactive (03/25 0000)  HBsAg: Negative (03/25 0000)  HIV: Non-reactive (08/11 0000)  GBS:     Assessment/Plan: Twin IUP at 37 weeks IUGR Baby B Oligohydramnios Baby B Previous Cesaren Section  Repeat LTCS Risks reviewed Consent signed  Lauren Morton 10/12/2020, 3:51 PM

## 2020-10-12 NOTE — Progress Notes (Signed)
MFM Note  Lauren Morton was seen for a biophysical profile due to a dichorionic, diamniotic twin gestation where IUGR of twin B was noted on her last exam.  She reports feeling fetal movements of both fetuses throughout the day.  She does report possible leakage of fluid about 3 days ago.  A biophysical profile performed today was 8 out of 8 for twin A.    A biophysical profile performed today was 6 out of 8 for twin B.  Twin B received a -2 for lack of fetal tone.  Oligohydramnios is also noted in twin B with a maximal vertical pocket of less than 2 cm.  Doppler studies of the umbilical arteries performed for both twin A and twin B continues to show normal forward flow.  There were no signs of absent or reversed end-diastolic flow in either fetus.  The patient was scheduled for a repeat cesarean delivery in 4 days.  However, due to oligohydramnios noted in twin B, delivery is recommended today.  The patient was sent to the hospital for admission and delivery following today's ultrasound exam.  As she last ate at around 8 AM today, her cesarean delivery will be performed later this afternoon.

## 2020-10-12 NOTE — Anesthesia Procedure Notes (Signed)
Spinal  Patient location during procedure: OR Start time: 10/12/2020 5:23 PM End time: 10/12/2020 5:27 PM Reason for block: surgical anesthesia Staffing Performed: anesthesiologist  Anesthesiologist: Freddrick March, MD Preanesthetic Checklist Completed: patient identified, IV checked, risks and benefits discussed, surgical consent, monitors and equipment checked, pre-op evaluation and timeout performed Spinal Block Patient position: sitting Prep: DuraPrep and site prepped and draped Patient monitoring: cardiac monitor, continuous pulse ox and blood pressure Approach: midline Location: L3-4 Injection technique: single-shot Needle Needle type: Pencan  Needle gauge: 24 G Needle length: 9 cm Assessment Sensory level: T6 Events: CSF return Additional Notes Functioning IV was confirmed and monitors were applied. Sterile prep and drape, including hand hygiene and sterile gloves were used. The patient was positioned and the spine was prepped. The skin was anesthetized with lidocaine.  Free flow of clear CSF was obtained prior to injecting local anesthetic into the CSF.  The spinal needle aspirated freely following injection.  The needle was carefully withdrawn.  The patient tolerated the procedure well.

## 2020-10-12 NOTE — Transfer of Care (Signed)
Immediate Anesthesia Transfer of Care Note  Patient: Lauren Morton  Procedure(s) Performed: CESAREAN SECTION MULTI-GESTATIONAL  Patient Location: PACU  Anesthesia Type:Spinal  Level of Consciousness: awake, alert , oriented and patient cooperative  Airway & Oxygen Therapy: Patient Spontanous Breathing  Post-op Assessment: Report given to RN and Post -op Vital signs reviewed and stable  Post vital signs: Reviewed and stable  Last Vitals:  Vitals Value Taken Time  BP 122/73 10/12/20 1830  Temp    Pulse 87 10/12/20 1833  Resp 19 10/12/20 1833  SpO2 97 % 10/12/20 1833  Vitals shown include unvalidated device data.  Last Pain:  Vitals:   10/12/20 1538  TempSrc: Oral         Complications: No notable events documented.

## 2020-10-12 NOTE — Anesthesia Preprocedure Evaluation (Signed)
Anesthesia Evaluation  Patient identified by MRN, date of birth, ID band Patient awake    Reviewed: Allergy & Precautions, NPO status , Patient's Chart, lab work & pertinent test results  Airway Mallampati: II  TM Distance: >3 FB Neck ROM: Full    Dental no notable dental hx.    Pulmonary neg pulmonary ROS,    Pulmonary exam normal breath sounds clear to auscultation       Cardiovascular Normal cardiovascular exam+ dysrhythmias (s/p SVT ablation 02/2020) Supra Ventricular Tachycardia  Rhythm:Regular Rate:Normal  01/29/2020 ECHO: EF 60-65%, no significant valvular abnormalities   Neuro/Psych negative neurological ROS  negative psych ROS   GI/Hepatic negative GI ROS, Neg liver ROS,   Endo/Other  Hypothyroidism Papillary thyroid CA s/p thyroidectomy  Renal/GU Renal InsufficiencyRenal disease  negative genitourinary   Musculoskeletal negative musculoskeletal ROS (+)   Abdominal   Peds  Hematology negative hematology ROS (+)   Anesthesia Other Findings C/S for twin gestation. Baby B IUGR and BPP 6/8 with low amniotic fluid  Reproductive/Obstetrics (+) Pregnancy                             Anesthesia Physical Anesthesia Plan  ASA: 3  Anesthesia Plan: Spinal   Post-op Pain Management:    Induction:   PONV Risk Score and Plan: Treatment may vary due to age or medical condition  Airway Management Planned: Natural Airway  Additional Equipment:   Intra-op Plan:   Post-operative Plan:   Informed Consent: I have reviewed the patients History and Physical, chart, labs and discussed the procedure including the risks, benefits and alternatives for the proposed anesthesia with the patient or authorized representative who has indicated his/her understanding and acceptance.     Dental advisory given  Plan Discussed with: CRNA  Anesthesia Plan Comments:         Anesthesia Quick  Evaluation

## 2020-10-13 ENCOUNTER — Encounter (HOSPITAL_COMMUNITY): Payer: Self-pay | Admitting: Obstetrics and Gynecology

## 2020-10-13 LAB — CBC
HCT: 26.4 % — ABNORMAL LOW (ref 36.0–46.0)
Hemoglobin: 8.7 g/dL — ABNORMAL LOW (ref 12.0–15.0)
MCH: 28.7 pg (ref 26.0–34.0)
MCHC: 33 g/dL (ref 30.0–36.0)
MCV: 87.1 fL (ref 80.0–100.0)
Platelets: 178 10*3/uL (ref 150–400)
RBC: 3.03 MIL/uL — ABNORMAL LOW (ref 3.87–5.11)
RDW: 14.4 % (ref 11.5–15.5)
WBC: 13.6 10*3/uL — ABNORMAL HIGH (ref 4.0–10.5)
nRBC: 0 % (ref 0.0–0.2)

## 2020-10-13 LAB — RPR: RPR Ser Ql: NONREACTIVE

## 2020-10-13 MED ORDER — LEVOTHYROXINE SODIUM 100 MCG PO TABS
200.0000 ug | ORAL_TABLET | Freq: Every day | ORAL | Status: DC
  Administered 2020-10-13 – 2020-10-15 (×3): 200 ug via ORAL
  Filled 2020-10-13 (×3): qty 2
  Filled 2020-10-13: qty 8

## 2020-10-13 NOTE — Op Note (Signed)
NAME: Lauren Morton, Lauren Morton MEDICAL RECORD NO: [id number] ACCOUNT NO: [id number] DATE OF BIRTH: May 08, 1985 FACILITY: [facility] LOCATION: [facility] PHYSICIAN: Sharyn Lull L. Helane Rima, MD  Operative Report   DATE OF PROCEDURE: 10/12/2020  PREOPERATIVE DIAGNOSES:  Intrauterine pregnancy at 51 weeks, twin IUP di-di, IUGR of twin B with oligohydramnios and previous cesarean section.  POSTOPERATIVE DIAGNOSES:  Intrauterine pregnancy at 67 weeks, twin IUP di-di, IUGR of twin B with oligohydramnios and previous cesarean section.  PROCEDURE:  Repeat low transverse cesarean section.  SURGEON:  Yuvonne Lanahan L. Helane Rima, MD  ANESTHESIA:  Spinal.  ESTIMATED BLOOD LOSS:  Per anesthesia record.  DRAINS:  Foley.  SPECIMEN:  Placenta sent to pathology.  COMPLICATIONS:  None.  DESCRIPTION OF PROCEDURE:  The patient was taken to the operating room.  Her spinal was placed.  She was then prepped and draped in the usual sterile fashion.  The Allis test was performed and a low transverse incision was made and carried down to the  fascia.  Fascia was scored in the midline and extended laterally.  The rectus muscles were separated in the midline.  The peritoneum was entered bluntly.  The peritoneal incision was then stretched.  The lower uterine segment was identified.  The bladder  flap was created sharply and then digitally.  The bladder blade was then readjusted.  A low transverse incision was made using a hemostat.  Amniotic fluid was clear.  Baby A was in cephalic presentation, was delivered easily with a gentle pull of the  vacuum extractor, was a female infant, Apgars 7 at 1 minute and 8 at 5 minutes.  Delayed cord clamping was performed.  The baby was handed to the neonatal team.  Baby B then came down vertex and an amniotomy revealed clear fluid, but minimal.  The baby  was delivered with an aid of a vacuum extractor with one pull, was a female infant, Apgars 8 at 1 minute and 9 at 5 minutes.  After delayed cord clamping, the  baby was handed to the neonatal team.  At this point, the placentas were manually removed and  noted to be normal in appearance and were sent to pathology.  Cord blood had been obtained.  The uterus was exteriorized and cleared of all clots and debris.  It was closed in a running stitch using 0 Vicryl.  Hemostasis was very good.  The adnexa  appeared normal.  Uterus was returned to the abdomen.  Irrigation was performed.  The peritoneum was closed using 0 Vicryl in a running stitch.  The fascia was closed using 0 Vicryl running stitch starting at each corner and meeting in the midline.   After irrigation of subcutaneous layer, the skin was closed with a 3-0 Vicryl on a Keith needle.  All sponge, lap and instrument counts were correct x 2.  Steri-Strips and a honeycomb dressing were applied, and the patient went to recovery room in stable  condition.   VAI D: 10/12/2020 6:16:59 pm T: 10/13/2020 1:47:00 am  JOB: [id number]/ [id number]

## 2020-10-13 NOTE — Progress Notes (Signed)
Subjective: Postpartum Day 1: Cesarean Delivery Patient reports tolerating PO, + flatus, and no problems voiding.    Objective: Vital signs in last 24 hours: Temp:  [97.9 F (36.6 C)-98.7 F (37.1 C)] 98.1 F (36.7 C) (10/13 1148) Pulse Rate:  [72-114] 82 (10/13 1148) Resp:  [17-25] 18 (10/13 1148) BP: (98-129)/(54-96) 125/73 (10/13 1148) SpO2:  [96 %-100 %] 100 % (10/13 1148) Weight:  [95.3 kg] 95.3 kg (10/12 1539)  Physical Exam:  General: alert, cooperative, and appears stated age 35: appropriate Uterine Fundus: firm Incision: healing well, no significant drainage DVT Evaluation: No evidence of DVT seen on physical exam. Negative Homan's sign. No cords or calf tenderness.  Recent Labs    10/12/20 1537 10/13/20 0415  HGB 10.6* 8.7*  HCT 33.1* 26.4*    Assessment/Plan: Status post Cesarean section. Doing well postoperatively.  Continue current care.  Linda Hedges 10/13/2020, 2:02 PM

## 2020-10-13 NOTE — Progress Notes (Signed)
OB specialty care RN gave report on patient at 1330 to transfer to mother baby unit. Plan was for patient to be transferred after lunch time and after patient visited in NICU with infants. OB specialty care NT's brought patient's belongings to mother baby room; however, patient was still with infants in NICU. This mother baby RN called NICU RN to request that NICU RN call mother baby when patient was ready to come to her room in order for mother baby RN to assist her to her room on mother baby and do an initial assessment to the unit. Maxwell Caul, Leretha Dykes [place]

## 2020-10-13 NOTE — Progress Notes (Signed)
Patient ambulated independently to mother baby unit from NICU for the first time at 1720. Admitted patient to unit, oriented patient to new room and answered questions. Maxwell Caul, Leretha Dykes [place]

## 2020-10-13 NOTE — Progress Notes (Signed)
In to round on patient but she is in NICU.  Will try again later.  Linda Hedges, DO

## 2020-10-13 NOTE — Progress Notes (Signed)
Patient screened out for psychosocial assessment since none of the following apply: °Psychosocial stressors documented in mother or baby's chart °Gestation less than 32 weeks °Code at delivery  °Infant with anomalies °Please contact the Clinical Social Worker if specific needs arise, by MOB's request, or if MOB scores greater than 9/yes to question 10 on Edinburgh Postpartum Depression Screen. ° °Shellsea Borunda Boyd-Gilyard, MSW, LCSW °Clinical Social Work °[phone] °  °

## 2020-10-14 ENCOUNTER — Encounter (HOSPITAL_COMMUNITY)
Admission: RE | Admit: 2020-10-14 | Discharge: 2020-10-14 | Disposition: A | Discharge status: Home / Self Care | Payer: BC Managed Care – PPO | Source: Ambulatory Visit | Attending: Obstetrics and Gynecology | Admitting: Obstetrics and Gynecology

## 2020-10-14 NOTE — Progress Notes (Signed)
Subjective: Postpartum Day 2: Cesarean Delivery Patient reports tolerating PO, + flatus, and no problems voiding.    Objective: Vital signs in last 24 hours: Temp:  [97.6 F (36.4 C)-98.2 F (36.8 C)] 97.6 F (36.4 C) (10/14 0647) Pulse Rate:  [82-95] 83 (10/14 0647) Resp:  [18] 18 (10/14 0647) BP: (112-125)/(73-85) 112/85 (10/14 0647) SpO2:  [99 %-100 %] 99 % (10/13 1730)  Physical Exam:  General: alert, cooperative, and no distress Lochia: appropriate Uterine Fundus: firm Incision: healing well DVT Evaluation: No evidence of DVT seen on physical exam.  Recent Labs    10/12/20 1537 10/13/20 0415  HGB 10.6* 8.7*  HCT 33.1* 26.4*    Assessment/Plan: Status post Cesarean section. Doing well postoperatively.  Continue current care. Both babies in [place] 10/14/2020, 7:22 AM

## 2020-10-14 NOTE — Lactation Note (Signed)
This note was copied from a baby's chart.  NICU Lactation Consultation Note  Patient Name: Lauren Morton ZOXWR'U Date: 10/14/2020 Age:37 hours   Subjective Reason for consult: Initial assessment; Multiple gestation Mother was bottle feeding when I arrived. She recalls breastfeeding her last baby for 6 months. Mother states she would like to attempt breastfeeding with twins.   Objective Infant data: Mother's Current Feeding Choice: Breast Milk  Infant feeding assessment Scale for Readiness: 3 Scale for Quality: 3   Maternal data: G2P2003  C-Section, Vacuum Assisted Does the patient have breastfeeding experience prior to this delivery?: Yes How long did the patient breastfeed?: 6 months   Pumping frequency: q3 Pumped volume: 10 mL   Pump: Personal (Medela pis)   Assessment Infant: Infant sleepy during bottle feed.  Maternal: Milk volume: Normal   Intervention/Plan Interventions: Education; "The NICU and Your Baby" book; [facility] Services brochure; Breast feeding basics reviewed  Tools: Pump  Plan: Mother to continue pumping q3 Consult Status: Follow-up  NICU Follow-up type: Assist with IDF-2 (Mother does not need to pre-pump before breastfeeding); New admission follow up Will plan return visit at 3pm feeding to assist with breastfeeding.   Gwynne Edinger 10/14/2020, 12:13 PM

## 2020-10-14 NOTE — Anesthesia Postprocedure Evaluation (Signed)
Anesthesia Post Note  Patient: Lauren Morton  Procedure(s) Performed: [place]     Patient location during evaluation: Mother Baby Anesthesia Type: Spinal Level of consciousness: oriented and awake and alert Pain management: pain level controlled Vital Signs Assessment: post-procedure vital signs reviewed and stable Respiratory status: spontaneous breathing and respiratory function stable Cardiovascular status: blood pressure returned to baseline and stable Postop Assessment: no headache, no backache, no apparent nausea or vomiting and spinal receding Anesthetic complications: no   No notable events documented.  Last Vitals:  Vitals:   10/14/20 0647 10/14/20 1554  BP: 112/85 (!) 128/95  Pulse: 83 85  Resp: 18 18  Temp: 36.4 C 36.7 C  SpO2:      Last Pain:  Vitals:   10/14/20 1554  TempSrc: Oral  PainSc:    Pain Goal: Patients Stated Pain Goal: 0 (10/12/20 2120)                 [name]

## 2020-10-14 NOTE — Lactation Note (Signed)
This note was copied from a baby's chart.  NICU Lactation Consultation Note  Patient Name: Zenola Dezarn XVEZB'M Date: 10/14/2020 Age:37 hours   Subjective Reason for consult: Initial assessment; Multiple gestation   Objective Infant data: Mother's Current Feeding Choice: Breast Milk  Infant feeding assessment Scale for Readiness: 2    Maternal data: G2P2003  C-Section, Vacuum Assisted  Does the patient have breastfeeding experience prior to this delivery?: Yes How long did the patient breastfeed?: 6 months   Pumping frequency: q3 Pumped volume: 10 mL   Pump: Personal (Medela pis for home use)   Assessment Infant:  Feeding Status: NPO   Maternal: Mother is pumping frequently and without difficulty. She is experiencing +breast changes today consistent with onset of copious milk.  Milk volume: Normal   Intervention/Plan Interventions: Education; "The NICU and Your Baby" book; [place] Services brochure  Tools: Pump  Plan: Consult Status: Follow-up  NICU Follow-up type: New admission follow up; Verify absence of engorgement; Verify onset of copious milk    Gwynne Edinger 10/14/2020, 12:04 PM

## 2020-10-15 MED ORDER — ACETAMINOPHEN 325 MG PO TABS: 650.0000 mg | ORAL_TABLET | ORAL | 0 refills | Status: AC | PRN

## 2020-10-15 MED ORDER — IBUPROFEN 600 MG PO TABS: 600.0000 mg | ORAL_TABLET | Freq: Four times a day (QID) | ORAL | 0 refills | Status: AC | PRN

## 2020-10-15 NOTE — Lactation Note (Signed)
This note was copied from a baby's chart. Lactation Consultation Note  Patient Name: Lauren Morton SYVGC'Y Date: 10/15/2020 Reason for consult: Follow-up assessment;NICU baby;Multiple gestation;Early term 37-38.6wks;Maternal discharge Age:37 hours  Visited with mom of 98 hours old ETI twins, she's a P2 and is experiencing the onset of lactogenesis II. She voiced she's familiar with BF because she BF her first child (48 mo old) for 6 months.  Mom is going home today, reviewed discharge education, engorgement prevention/treatment and sore nipples. Mom was also educated on the benefits of breastmilk for NICU babies and STS care.  Plan of care:  Encouraged mom to start pumping consistently, at least 8 times/24 hours She'll start using coconut oil prior pumping and her own EBM for breast care She'll continue taking babies to breast for some STS care or latching only if feeding cues are present  FOB present and supportive. All questions and concerns answered, parents to call NICU LC PRN.  Maternal Data   Mom's supply is WNL  Feeding Mother's Current Feeding Choice: Breast Milk  Lactation Tools Discussed/Used Tools: Pump;Flanges Flange Size: 27 Breast pump type: Double-Electric Breast Pump Pump Education: Setup, frequency, and cleaning;Milk Storage Reason for Pumping: ETI twins in NICU Pumping frequency: 6 times/24 hours Pumped volume: 80 mL  Discharge Discharge Education: Engorgement and breast care Pump: DEBP;Personal (Medela DEBP)  Consult Status Consult Status: Follow-up Date: 10/15/20 Follow-up type: In-patient   Gabreille Dardis Francene Boyers 10/15/2020, 12:04 PM

## 2020-10-15 NOTE — Discharge Summary (Signed)
Postpartum Discharge Summary  Date of Service updated 10/15/20     Patient Name: Lauren Morton DOB: 10/29/1985 MRN: [id number]  Date of admission: 10/12/2020 Delivery date:   Tichina, Koebel [[id number]]  10/12/2020    Panagiota, Perfetti [[id number]]  10/12/2020  Delivering provider:    Pascal Lux [[id number]]  Ralphine, Hinks [[id number]]  Dian Queen  Date of discharge: 10/15/2020  Admitting diagnosis: Twins live born in hospital [Z38.30] IUGR (intrauterine growth restriction) affecting care of mother [O84.5990] S/P cesarean section [Z98.891] Intrauterine pregnancy: [redacted]w[redacted]d    Secondary diagnosis:  Active Problems:   Twins live born in hospital   IUGR (intrauterine growth restriction) affecting care of mother   S/P cesarean section  Additional problems:     Discharge diagnosis: Term Pregnancy Delivered and twins, IUGR twin B                                               Post partum procedures: Augmentation:  Complications: None  Hospital course: Sceduled C/S   35y.o. yo G2P2003 at 315w0das admitted to the hospital 10/12/2020 for scheduled cesarean section with the following indication:Elective Repeat, Multifetal Gestation, and IUGR of twin B .Delivery details are as follows:  Membrane Rupture Time/Date:    MoLateia, Fraser0[[id number]]5:49 PM    MoTawni Levy0[[id number]]5:52 PM ,   MoMorganne, Haile0[[id number]]10/12/2020    MoAlexzandra, Bilton0[[id number]]10/12/2020   Delivery Method:   MoPascal Lux0[[id number]]C-Section, Vacuum Assisted    MoShulamit, Donofrio0[[id number]]C-Section, Vacuum Assisted  Details of operation can be found in separate operative note.  Patient had an uncomplicated postpartum course.  She is ambulating, tolerating a regular diet, passing flatus, and urinating well. Patient is discharged home in stable condition on  10/15/20        Newborn Data: Birth  date:   MoBrocha, Gilliam0[[id number]]10/12/2020    MoMarguerette, Sheller0[[id number]]10/12/2020  Birth time:   MoLaquandra, Carrillo0[[id number]]5:50 PM    MoTawni Levy0[[id number]]5:52 PM  Gender:   MoKierra, Jezewski0[[id number]]Female    MoSharalyn, Lomba0[[id number]]Female  Living status:   MoSetsuko, Robins0[[id number]]Living    MoKemari, MaresaArab0[[id number]]Living  Apgars:   MoJazyiah, Yiu0[[id number]]7 [address] [place][[id number]]9 ,   MoMoneisha, Vosler0[[id number]]8    [place]Martena, [place]0[[id number]]9  Weight:   MoJaanvi, Fizer0[[id number]]2450 g    MoKirsten, Spearing0[[id number]]1910 g     Magnesium Sulfate received: No BMZ received: No Rhophylac:No MMR:No T-DaP:Given prenatally Flu: No Transfusion:No  Physical exam  Vitals:   10/14/20 0647 10/14/20 1554 10/14/20 2017 10/15/20 0423  BP: 112/85 (!) 128/95 123/89 (!) 118/58  Pulse: 83 85 80 77  Resp: _0 Temp: 97.6 F (36.4 C) 98 F (36.7 C) 98.4 F (36.9 C) 97.6 F (36.4 C)  TempSrc: Oral Oral Oral Oral  SpO2:      Weight:      Height:       General: alert, cooperative, and  no distress Lochia: appropriate Uterine Fundus: firm Incision: Healing well with no significant drainage DVT Evaluation: No evidence of DVT seen on physical exam. Labs: Lab Results  Component Value Date   WBC 13.6 (H) 10/13/2020   HGB 8.7 (L) 10/13/2020   HCT 26.4 (L) 10/13/2020   MCV 87.1 10/13/2020   PLT 178 10/13/2020   CMP Latest Ref Rng & Units 10/12/2020  Glucose 70 - 99 mg/dL 73  BUN 6 - 20 mg/dL 7  Creatinine 0.44 - 1.00 mg/dL 0.62  Sodium 135 - 145 mmol/L 136  Potassium 3.5 - 5.1 mmol/L 3.8  Chloride 98 - 111 mmol/L 108  CO2 22 - 32 mmol/L 20(L)  Calcium 8.9 - 10.3 mg/dL 8.2(L)  Total Protein 6.5 - 8.1 g/dL 5.0(L)  Total Bilirubin 0.3 - 1.2 mg/dL 0.5  Alkaline Phos 38 - 126 U/L 125  AST 15 - 41 U/L 23  ALT 0 - 44 U/L 18    Edinburgh Score: Edinburgh Postnatal Depression Scale Screening Tool 10/14/2020  I have been able to laugh and see the funny side of things. (No Data)  I have looked forward with enjoyment to things. -  I have blamed myself unnecessarily when things went wrong. -  I have been anxious or worried for no good reason. -  I have felt scared or panicky for no good reason. -  Things have been getting on top of me. -  I have been so unhappy that I have had difficulty sleeping. -  I have felt sad or miserable. -  I have been so unhappy that I have been crying. -  The thought of harming myself has occurred to me. Flavia Shipper Postnatal Depression Scale Total -      After visit meds:  Allergies as of 10/15/2020   No Known Allergies      Medication List     TAKE these medications    acetaminophen 325 MG tablet Commonly known as: TYLENOL Take 2 tablets (650 mg total) by mouth every 4 (four) hours as needed for mild pain (temperature > 101.5.).   ibuprofen 600 MG tablet Commonly known as: ADVIL Take 1 tablet (600 mg total) by mouth every 6 (six) hours as needed.   levothyroxine 200 MCG tablet Commonly known as: Synthroid Take 1 tablet (200 mcg total) by mouth daily before breakfast. What changed: how much to take   multivitamin-prenatal 27-0.8 MG Tabs tablet Take 1 tablet by mouth daily in the afternoon.         Discharge home in stable condition Infant Feeding: Breast Infant Disposition:NICU Discharge instruction: per After Visit Summary and Postpartum booklet. Activity: Advance as tolerated. Pelvic rest for 6 weeks.  Diet: routine diet Anticipated Birth Control: Unsure Postpartum Appointment:6 weeks Additional Postpartum F/U:  Future Appointments: Future Appointments  Date Time Provider [place]  03/22/2021  9:00 AM Philemon Kingdom, MD [id number] None   Follow up Visit:      10/15/2020 Allena Katz, MD

## 2020-10-16 ENCOUNTER — Encounter (HOSPITAL_COMMUNITY): Admission: RE | Payer: Self-pay | Source: Home / Self Care

## 2020-10-16 ENCOUNTER — Inpatient Hospital Stay (HOSPITAL_COMMUNITY)
Admission: RE | Admit: 2020-10-16 | Payer: BC Managed Care – PPO | Source: Home / Self Care | Admitting: Obstetrics and Gynecology

## 2020-10-16 ENCOUNTER — Ambulatory Visit: Payer: Self-pay

## 2020-10-16 DIAGNOSIS — O321XX2 Maternal care for breech presentation, fetus 2: Secondary | ICD-10-CM

## 2020-10-16 SURGERY — Surgical Case
Anesthesia: Regional

## 2020-10-16 NOTE — Lactation Note (Signed)
This note was copied from a baby's chart. Lactation Consultation Note  Patient Name: Mylissa Lambe GGEZM'O Date: 10/16/2020 Reason for consult: Follow-up assessment;NICU baby;Multiple gestation;Early term 37-38.6wks;Mother's request;Infant < 6lbs Age:36 days  Visited with mom of 90 hours ETI NICU twins, she requested LC because her right breast feels hard but not painful. yet Breast are swollen, mom is getting engorged; noticed that her bra is tight and leaving indentation marks on her, provided a pumping band in the mean time; as well as ice packs and extra storage bottles; mom was very appreciative.  Reviewed onset of lactogenesis II, engorgement prevention/treatment, mom understands she'll continue to ice the breasts prior each pumping session until the swelling resolves.   Plan of care:  Encouraged mom to continue pumping consistently, at least 8 times/24 hours She'll ice her breast for at least 20 minutes at a time prior pumping She'll continue taking babies to breast for some STS care or latching only if feeding cues are present  No other support person at this time. All questions and concerns answered, parents to call NICU LC PRN.  Maternal Data   Mom's supply is WNL but has started to decrease, she only pumped 1 oz. On each side on her last pumping session probably due to swelling and the onset of engorgement.   Feeding Mother's Current Feeding Choice: Breast Milk  Lactation Tools Discussed/Used Tools: Pump;Flanges;Hands-free pumping top Flange Size: 27 Breast pump type: Double-Electric Breast Pump Pump Education: Setup, frequency, and cleaning;Milk Storage Reason for Pumping: ETI twins in NICU Pumping frequency: 6-7 times/24 hours Pumped volume: 90 mL (90-150 ml)  Interventions Interventions: Breast massage;Hand express;Reverse pressure;DEBP;Ice;Education  Discharge Discharge Education: Engorgement and breast care Pump: DEBP;Personal (Medela DEBP)  Consult  Status Consult Status: Follow-up Date: 10/16/20 Follow-up type: In-patient   Ouita Nish Francene Boyers 10/16/2020, 12:38 PM

## 2020-10-17 ENCOUNTER — Ambulatory Visit: Payer: Self-pay

## 2020-10-17 LAB — SURGICAL PATHOLOGY

## 2020-10-17 NOTE — Lactation Note (Signed)
This note was copied from a baby's chart.  NICU Lactation Consultation Note  Patient Name: Lauren Morton IOMBT'D Date: 10/17/2020 Age:37 days   Subjective Reason for consult: Follow-up assessment; Engorgement FOB in room; mother on Facetime. Mother's supply continues to increase but her breasts feel softer today.   Objective  Maternal data: G2P2003  C-Section, Vacuum Assisted   Pumping frequency: q3 Pumped volume: 150 mL Flange Size: 27   Pump: DEBP; Personal (Medela DEBP)   Assessment Maternal: Milk volume: Abundant Engorgement is subsiding and mother's milk supply is wnl for multiples   Intervention/Plan Interventions: Breast massage; Hand express; Reverse pressure; DEBP; Ice; Education  Tools: Pump; Flanges; Hands-free pumping top  Plan: Consult Status: Follow-up  NICU Follow-up type: Weekly NICU follow up  Mother will be at hospital later today. She will have [facility] paged for f/u prn.   Gwynne Edinger 10/17/2020, 10:51 AM

## 2020-10-19 ENCOUNTER — Ambulatory Visit: Payer: Self-pay

## 2020-10-19 NOTE — Lactation Note (Signed)
This note was copied from a baby's chart. Lactation Consultation Note  Patient Name: Lauren Morton Today's Date: 10/19/2020   Age:37 days  LC in baby's room to visit with mom, but only FOB was available. SLP Raquel Sarna asked to check on baby "[name]" baby "B"; FOB voiced that both babies have been going to breast but that mom could use some help. She'll be back tomorrow for the 3 pm feedings assist, scheduled appt at that time. No charge.   [place] 10/19/2020, 7:47 PM

## 2020-10-20 ENCOUNTER — Ambulatory Visit: Payer: Self-pay

## 2020-10-20 NOTE — Lactation Note (Signed)
This note was copied from a baby's chart. Lactation Consultation Note  Patient Name: Lauren Morton Date: 10/20/2020 Reason for consult: Follow-up assessment;Early term 37-38.6wks;NICU baby;Multiple gestation;Breastfeeding assistance Age:37 days  Visited with mom of 44 days old ETI NICU female for feeding assist. Mom pre-pumped 1-2 oz. prior this, she voiced that baby 'Lauren Morton" has been going to breast regularly. Baby already awake and in her quiet alert stated, mom took her to the right breast first and she fed for about 12 minutes with a few audible swallows. Then she switched to the left side, baby would still continue to suckle taking little breast, but most of the sucking during this feeding assist was NNS.  Mom also voiced that her breast still feel kind of full after baby is done nursing. Explained to mom the importance of continuing pumping after feedings to protect her supply since baby is still working on her feedings and may not fully empty the breast as the DEBP does; she voiced understanding.  Plan of care:   Encouraged mom to continue pumping consistently, at least 8 times/24 hours. She'll continue taking baby to breast for some STS care or latching whenever feeding cues are present   No other support person at this time. All questions and concerns answered, parents to call NICU LC PRN.   Maternal Data   Mom's supply is WNL for twins  Feeding Mother's Current Feeding Choice: Breast Milk  LATCH Score Latch: Grasps breast easily, tongue down, lips flanged, rhythmical sucking.  Audible Swallowing: A few with stimulation (mostly NNS)  Type of Nipple: Everted at rest and after stimulation  Comfort (Breast/Nipple): Soft / non-tender  Hold (Positioning): No assistance needed to correctly position infant at breast.  LATCH Score: 9  Lactation Tools Discussed/Used Flange Size: 27 Breast pump type: Double-Electric Breast Pump Pump Education: Setup, frequency, and  cleaning;Milk Storage Reason for Pumping: ETI twins in NICU Pumping frequency: 6 times/24 hours Pumped volume: 150 mL (150-180 ml)  Interventions Interventions: Breast compression;Hand express;Assisted with latch;Support pillows;Adjust position;DEBP;Education  Discharge Pump: DEBP;Personal (Medela DEBP at home)  Consult Status Consult Status: Follow-up Date: 10/20/20 Follow-up type: In-patient   Lauren Morton 10/20/2020, 11:46 AM

## 2020-10-20 NOTE — Lactation Note (Signed)
This note was copied from a baby's chart. Lactation Consultation Note  Patient Name: Lauren Morton [id number] Date: 10/20/2020 Reason for consult: Follow-up assessment;NICU baby;Multiple gestation;Early term 37-38.6wks;Breastfeeding assistance Age:37 days  Visited with mom of 63 days old ETI NICU female, she requested a feeding assist for 3 pm. LC assisted mom by getting her comfortable and bringing pillows, mom is experienced BF and feels pretty comfortable doing it on her own.  It took a few attempts for baby "Lauren Morton" to latch to the right breast in cross cradle position, showed mom how to hold baby's neck to increased the depth of the latch, since that point baby nursed straight, taking just a few pauses whenever mom had a let down or LC did compressions.  Mom had pre-pumped some; but some audible swallows were still noticeable, baby "Lauren Morton" nursed for a total of 20 minutes with a combination of both, NS and NNS. Mom is going to start getting familiar with her breast on how they feel when they're empty, full and in-between. She understands that pumping after feeding is still critical to protect her supply.  Plan of care:   Encouraged mom to continue pumping consistently, at least 8 times/24 hours. She'll continue taking baby to breast for some STS care or latching whenever feeding cues are present   No other support person at this time. All questions and concerns answered, parents to call NICU LC PRN.  Maternal Data   Mom's supply is WNL for twins  Feeding Mother's Current Feeding Choice: Breast Milk Nipple Type: Dr. Myra Morton Preemie  LATCH Score Latch: Repeated attempts needed to sustain latch, nipple held in mouth throughout feeding, stimulation needed to elicit sucking reflex. (baby was a bit uncoordinated, needed some "direction" to re-latch each time)  Audible Swallowing: A few with stimulation  Type of Nipple: Everted at rest and after stimulation  Comfort  (Breast/Nipple): Soft / non-tender  Hold (Positioning): No assistance needed to correctly position infant at breast. (minimal assistance needed)  LATCH Score: 8  Lactation Tools Discussed/Used Tools: Pump;Flanges Flange Size: 27 Breast pump type: Double-Electric Breast Pump Pump Education: Setup, frequency, and cleaning;Milk Storage Reason for Pumping: ETI twins in NICU Pumping frequency: 6-7 times/24 hours Pumped volume: 150 mL (150-180 ml.)  Interventions Interventions: Assisted with latch;Skin to skin;Breast massage;Hand express;DEBP;Education  Discharge Pump: DEBP;Personal (Medela DEBP at home)  Consult Status Consult Status: Follow-up Date: 10/20/20 Follow-up type: In-patient   Lauren Morton Lauren Morton 10/20/2020, 3:39 PM

## 2020-10-24 ENCOUNTER — Telehealth (HOSPITAL_COMMUNITY): Payer: Self-pay | Admitting: *Deleted

## 2020-10-24 NOTE — Telephone Encounter (Signed)
Attempted hospital discharge follow-up call. Left message for patient to return RN call. Erline Levine, RN, 10/24/20, [phone]

## 2020-11-11 ENCOUNTER — Other Ambulatory Visit: Payer: Self-pay

## 2020-11-11 ENCOUNTER — Other Ambulatory Visit (INDEPENDENT_AMBULATORY_CARE_PROVIDER_SITE_OTHER): Payer: BC Managed Care – PPO

## 2020-11-11 DIAGNOSIS — E890 Postprocedural hypothyroidism: Secondary | ICD-10-CM

## 2020-11-11 LAB — T4, FREE: Free T4: 1.17 ng/dL (ref 0.60–1.60)

## 2020-11-11 LAB — TSH: TSH: 1.39 u[IU]/mL (ref 0.35–5.50)

## 2020-11-25 ENCOUNTER — Other Ambulatory Visit: Payer: Self-pay | Admitting: Internal Medicine

## 2020-12-30 ENCOUNTER — Other Ambulatory Visit: Payer: Self-pay | Admitting: Internal Medicine

## 2021-03-22 ENCOUNTER — Ambulatory Visit (INDEPENDENT_AMBULATORY_CARE_PROVIDER_SITE_OTHER): Payer: Self-pay | Admitting: Internal Medicine

## 2021-03-22 ENCOUNTER — Encounter: Payer: Self-pay | Admitting: Internal Medicine

## 2021-03-22 ENCOUNTER — Other Ambulatory Visit: Payer: Self-pay

## 2021-03-22 VITALS — BP 120/76 | HR 80 | Ht 71.0 in | Wt 172.2 lb

## 2021-03-22 DIAGNOSIS — E890 Postprocedural hypothyroidism: Secondary | ICD-10-CM

## 2021-03-22 DIAGNOSIS — C73 Malignant neoplasm of thyroid gland: Secondary | ICD-10-CM

## 2021-03-22 LAB — T4, FREE: Free T4: 0.8 ng/dL (ref 0.60–1.60)

## 2021-03-22 LAB — TSH: TSH: 24.52 u[IU]/mL — ABNORMAL HIGH (ref 0.35–5.50)

## 2021-03-22 NOTE — Patient Instructions (Signed)
Please continue Levothyroxine 200 mcg daily.  Take the thyroid hormone every day, with water, at least 30 minutes before breakfast, separated by at least 4 hours from: - acid reflux medications - calcium - iron - multivitamins  Please stop at the lab.  Please come back for a follow-up appointment in 6 months. 

## 2021-03-22 NOTE — Progress Notes (Addendum)
Patient ID: Geisha Abernathy, female   DOB: Apr 16, 1985, 36 y.o.   MRN: [id number] ? ?This visit occurred during the SARS-CoV-2 public health emergency.  Safety protocols were in place, including screening questions prior to the visit, additional usage of staff PPE, and extensive cleaning of exam room while observing appropriate contact time as indicated for disinfecting solutions.  ? ?HPI  ?Gilberte Gorley is a 36 y.o.-year-old female, returning for f/u for papillary thyroid cancer and postsurgical hypothyroidism. Last visit 6 months ago. ? ?Interim history: ?Patient gave birth to twins in 10/2020.  They are both healthy.  She is breast-feeding.  She also has a healthy daughter born 07/2019 [name]). 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  She had SVT ablation in 2022 and her palpitations resolved. ?No complaints today other than hair loss after the pregnancy.  She is feeling well.  She lost approximately 40 pounds after giving birth.  She is occasionally feeling jittery, but without palpitations. ? ?Reviewed her thyroid cancer history: ?Pt. has been dx with goiter in ~2006 >> thyroid U/S: ? Results but also dx'ed with Hashimoto's thyroiditis with hypothyroidism >> started levothyroxine. ? ?She started to see Dr Danise Mina 03/2014 >> new U/S (04/2014): small R thyroid nodule 1.4 x 1.3 x 1.1 cm, with microcalcifications >>  PTC in 04/2014, by FNA.  ? ?- 06/03/2014: Total thyroidectomy -Dr. Harlow Asa. ?Path: ?Diagnosis ?1. Thyroid, thyroidectomy, total thyroid, suture right superior pole ?- PAPILLARY THYROID CARCINOMA, 1.5 CM IN GREATEST DIMENSION. ?- MARGINS ARE NEGATIVE. ?- BACKGROUND FLORID LYMPHOCYTIC THYROIDITIS. ?- SEE ONCOLOGY TEMPLATE. ?2. Lymph nodes, regional resection, central compartment ?- TEN BENIGN LYMPH NODES WITH NO TUMOR SEEN (0/10). ?Microscopic Comment ?1. THYROID ?Specimen: Thyroid with central compartment lymph nodes. ?Procedure: Total thyroidectomy with limited central  compartment lymph dissection. ?Specimen Integrity (intact/fragmented): Intact. ?Tumor focality: Unifocal. ?Dominant tumor: ?Maximum tumor size (cm): 1.5 cm. ?Tumor laterality: Tumor involves right superior pole. ?Histologic type (including subtype and/or unique features as applicable): Papillary thyroid carcinoma, ?conventional type. ?Tumor capsule: Tumor is partially encapsulated. ?Extrathyroidal extension: No. ?Margins: Negative. ?Lymph - Vascular invasion: Definitive lymph/vascular is not identified. ?Capsular invasion with degree of invasion if present: No capsular invasion identified. ?Lymph nodes: # examined 10; # positive 0. ?TNM code: pT1b, pN0. ?Non-neoplastic thyroid: [place] lymphocytic thyroiditis. (RH:ds 06/04/14) ? ?12/2015: Neck U/S 1 year after thyroidectomy: ? Post total thyroidectomy with minimal amount (approximately 1 cm) of residual nodular tissue within the right thyroid lobectomy resection bed potentially indicative of residual thyroid tissue though residual/locally recurrent disease is not excluded on the basis this examination. This residual tissue could be amenable to ultrasound-guided fine-needle aspiration as clinically indicated. ? ?Neck U/S (12/28/2018): ?Isthmus: Surgically absent. There is no residual nodular soft tissue ?within the isthmic resection bed. ? ?Right lobe: Surgically absent. There is fairly well-defined ?hypoechoic nodular soft tissue measuring approximately 0.5 x 0.4 x ?1.0 cm within the right thyroid lobectomy resection bed. ? ?Left lobe: Surgically absent. There is no residual nodular soft ?tissue within the left lobectomy resection bed. ?  ?Benign appearing non pathologically enlarged cervical lymph nodes ?are seen bilaterally. ? ?Neck U/S (01/03/2018): Redemonstration surgical changes of thyroidectomy. No residual ?  thyroid tissue identified within the left or right thyroid bed. ? ?Her thyroglobulin levels became undetectable at last check: ?Lab Results  ?Component  Value Date  ? THYROGLB <0.1 (L) 09/16/2019  ? THYROGLB 0.3 (L) 06/05/2018  ? THYROGLB 0.2 (L) 12/12/2017  ? THYROGLB 0.4 (L) 03/01/2017  ? THYROGLB  0.3 (L) 01/25/2016  ? THYROGLB 0.8 (L) 02/18/2015  ? THYROGLB 0.8 (L) 08/18/2014  ? ?ATA antibodies are undetectable: ?Lab Results  ?Component Value Date  ? THGAB 1 09/16/2019  ? THGAB <1 06/05/2018  ? THGAB <1 12/12/2017  ? THGAB <1 03/01/2017  ? THGAB <1 01/25/2016  ? THGAB <1 02/18/2015  ? THGAB 1 08/18/2014  ? ?Postsurgical hypothyroidism: ? ?Reviewed and addended history: ?In 12/2017, she was supposed to be on levothyroxine 125 mcg daily, however, the pharmacy refilled the previous prescription for 175 mcg daily and she was taking this for the months prior to our last visit.  Subsequently, TSH was suppressed.  We decreased the dose to 125 mcg daily and her TFTs normalized in 01/2018.  However, then, TSH was suppressed and we decreased the dose of her levothyroxine.  She did not return for repeat labs 1.5 months later...  ? ?In 12/2018, her TSH was normal, but above target for pregnancy so we increased her levothyroxine dose to 9 tablets of 112 mcg/week.  However, since then, she had several TSH levels that were much higher, unexplained.  I was not aware of that was done, as she had them checked when she presented to the ED with tachycardia.  On 05/04/2019, TSH was still high at 15.9. ? ?TFTs remained uncontrolled due to missed levothyroxine doses.  We had to increase the levothyroxine dose to 200 mcg daily in 07/2019. ? ?However, we could decrease the dose to 150 mcg daily 09/2019. ? ?However, at last visit, she was again pregnant so we increased the dose to 200 mcg daily. ? ?In 09/2020, close to the end of her twin pregnancy, she was again missing levothyroxine doses and the TSH was very high, at 35. ? ?After starting to take the medication consistently, TSH normalized in 11/2020. ? ?Patient takes the levothyroxine 200 mcg daily: ?- in am ?- fasting ?- at least 30  min from b'fast ?- no Ca, Fe, + prenatal multivitamins in the evening ?- no PPIs ?- not on Biotin ? ?We reviewed her TFTs: ?Lab Results  ?Component Value Date  ? TSH 1.39 11/11/2020  ? TSH 35.06 (H) 09/20/2020  ? TSH 0.59 04/13/2020  ? TSH 1.28 03/15/2020  ? TSH 0.66 01/11/2020  ? TSH 0.01 (L) 09/16/2019  ? TSH 8.64 (H) 07/08/2019  ? TSH 4.93 (H) 06/08/2019  ? TSH 15.91 (H) 05/04/2019  ? TSH 33.625 (H) 05/03/2019  ? FREET4 1.17 11/11/2020  ? FREET4 0.55 (L) 09/20/2020  ? FREET4 1.20 04/13/2020  ? FREET4 1.10 03/15/2020  ? FREET4 1.40 01/11/2020  ? FREET4 1.66 (H) 09/16/2019  ? FREET4 0.81 07/08/2019  ? FREET4 0.85 06/08/2019  ? FREET4 0.75 05/04/2019  ? FREET4 0.69 05/03/2019  ?01/10/2017: TSH 0.03 at OB/GYN office -we decreased the dose of levothyroxine at that time. ? ?Pt denies: ?- feeling nodules in neck ?- hoarseness ?- dysphagia ?- choking ?- SOB with lying down ? ?She has + FH of thyroid disorders in: MGM - Hashimoto's ds. No FH of thyroid cancer. No h/o radiation tx to head or neck. ?No herbal supplements. No Biotin use. No recent steroids use.  ? ?ROS: ?+ See HPI ? ?I reviewed pt's medications, allergies, PMH, social hx, family hx, and changes were documented in the history of present illness. Otherwise, unchanged from my initial visit note. ? ?Past Medical History:  ?Diagnosis Date  ? AVNRT (AV nodal re-entry tachycardia) ([place]) 2021  ? CKD (chronic kidney disease) stage 1, GFR 90  ml/min or greater 09/2013  ? mild proteinuria [facility]) sees yearly  ? Dysrhythmia   ? SVT during pregnancy  ? Hashimoto's thyroiditis   ? confirmed by biopsy s/p thyroidectomy  ? Hypothyroidism, acquired, autoimmune   ? Papillary thyroid carcinoma ([place]) 03/31/2014  ? s/p thyroidectomy, 10 negative LN  ? ?Past Surgical History:  ?Procedure Laterality Date  ? CESAREAN SECTION N/A 07/15/2019  ? Procedure: PRIAMRY CESAREAN SECTION;  Surgeon: Everlene Farrier, MD;  Location: [facility];  Service: Obstetrics;  Laterality: N/A;  ? CESAREAN  SECTION MULTI-GESTATIONAL N/A 10/12/2020  ? Procedure: CESAREAN SECTION MULTI-GESTATIONAL;  Surgeon: Dian Queen, MD;  Location: [facility];  Service: Obstetrics;  Laterality: N/A;  ? LYMPH NODE DISSECTION

## 2021-03-23 LAB — THYROGLOBULIN LEVEL: Thyroglobulin: 0.1 ng/mL — ABNORMAL LOW

## 2021-03-23 LAB — THYROGLOBULIN ANTIBODY: Thyroglobulin Ab: 1 IU/mL (ref <=1)

## 2021-07-08 IMAGING — US US MFM FETAL BPP W/O NON-STRESS
1 series · 14 of 28 positions shown · non-contrast
Comparison: none

[Series 1: us mfm fetal bpp w/o non-stress · 59 acquisitions, 14 frames shown]
[im 3/59]
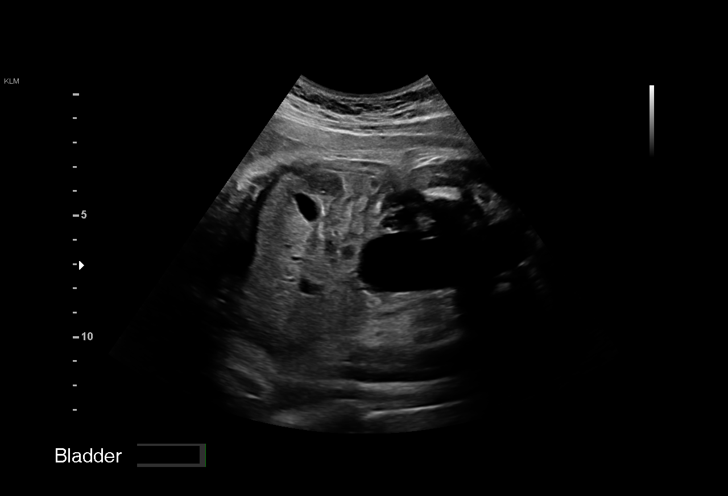
[im 7/59]
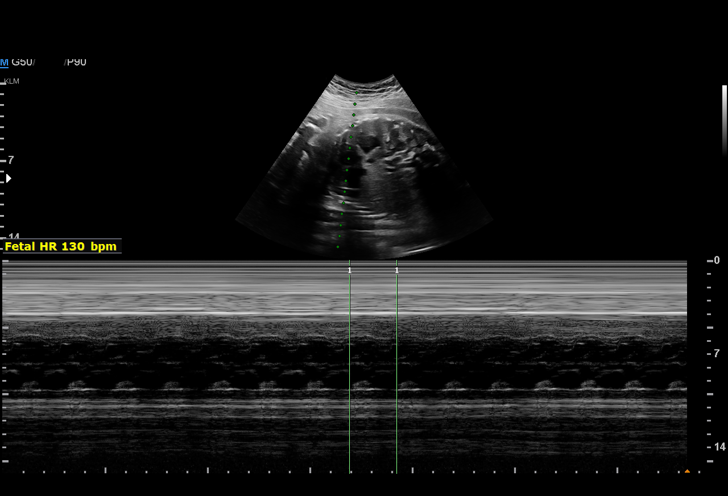
[im 11/59]
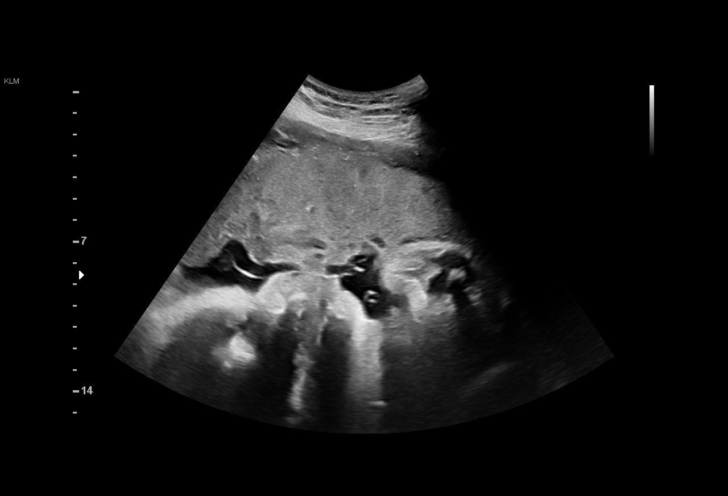
[im 16/59]
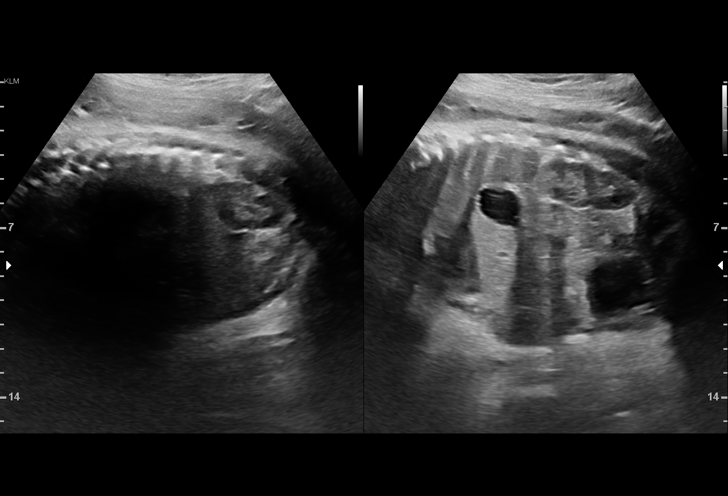
[im 20/59]
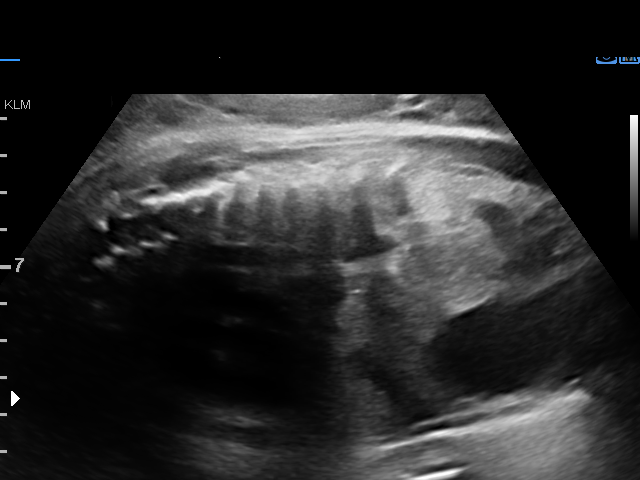
[im 24/59]
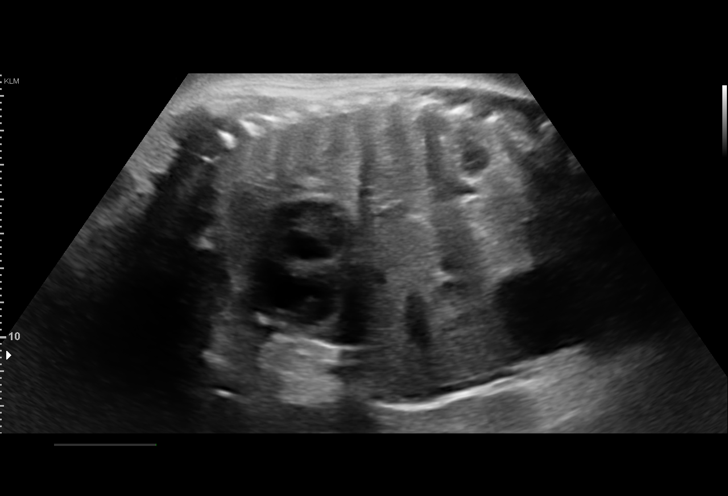
[im 28/59]
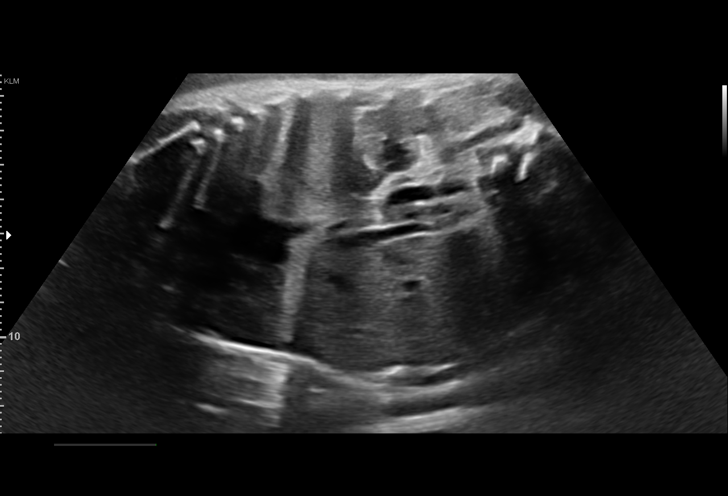
[im 33/59]
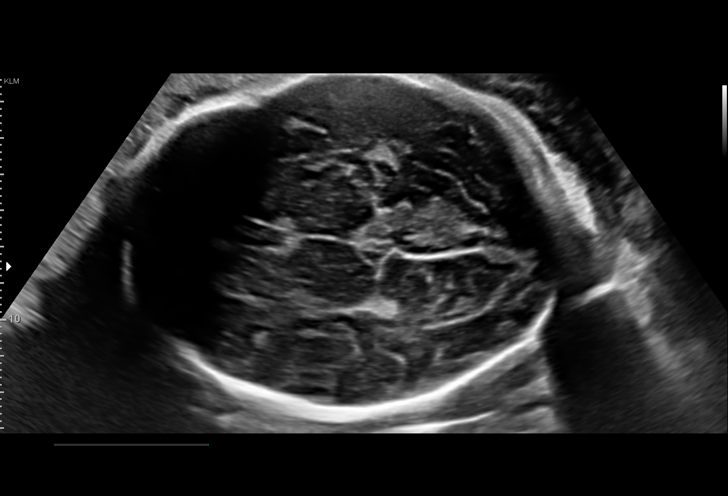
[im 37/59]
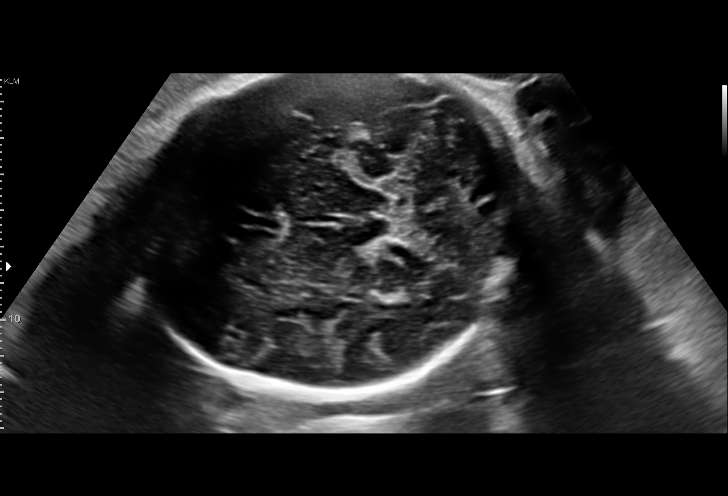
[im 41/59]
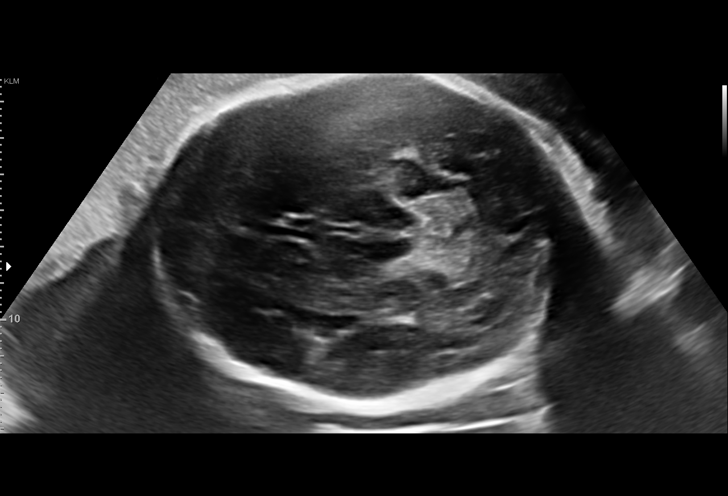
[im 46/59]
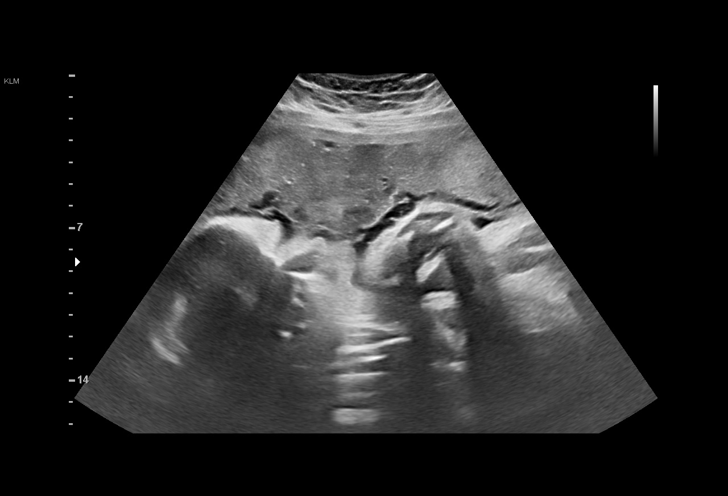
[im 50/59]
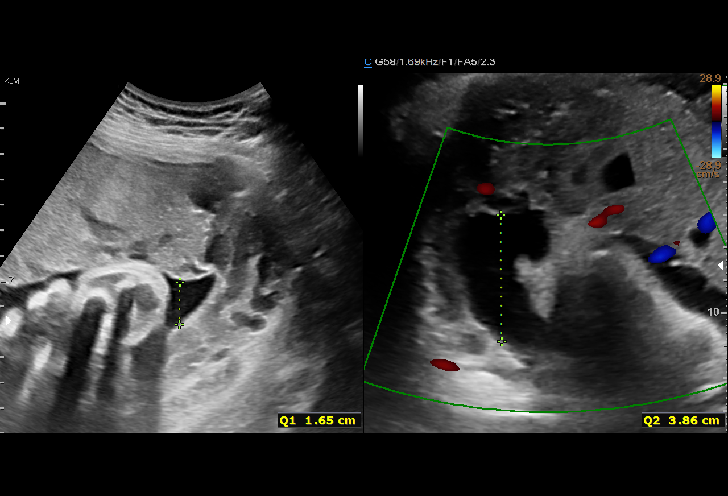
[im 54/59]
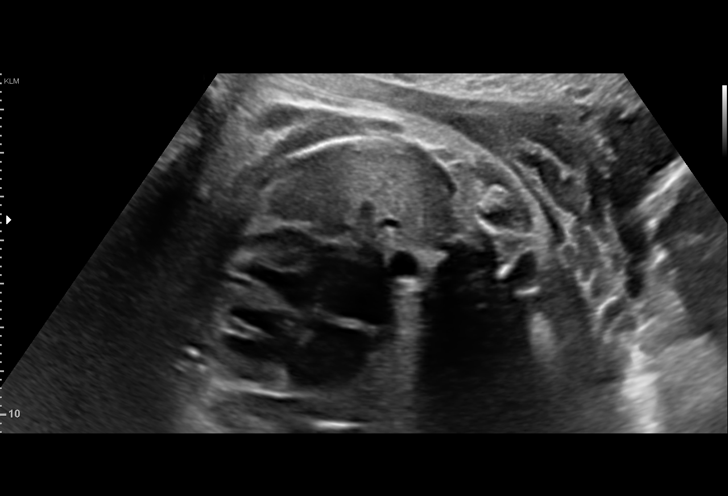
[im 59/59]
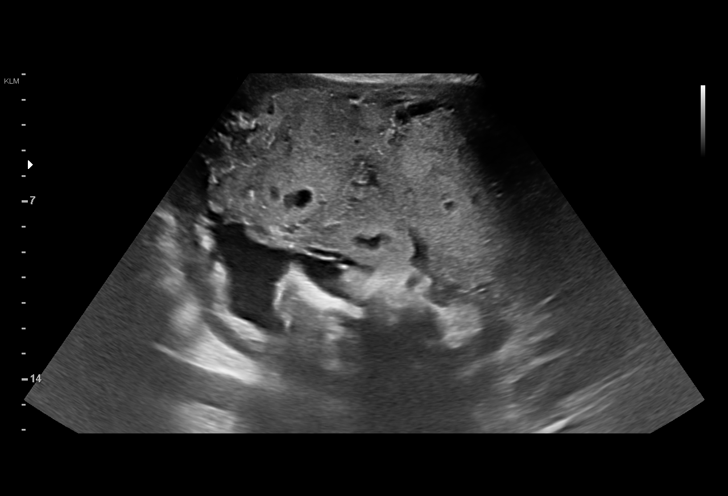

[14 of 28 positions shown; findings below may reference images not displayed]

TAPEDZA CNM
 Referred By:      [facility] MAU/Triage         [facility]

Indications

 Oligohydraminios, third trimester, unspecified
 Advanced maternal age primigravida 35+,
 third trimester
 Hypothyroid (postsurgical-papillary thyroid
 CA) on Levothyroxine
 Maternal arrhythmia (tachcardia-SVT             O99.413,
 episodes) affecting pregnancy, third trimester
 36 weeks gestation of pregnancy
Fetal Evaluation

 Num Of Fetuses:          1
 Fetal Heart Rate(bpm):   130
 Cardiac Activity:        Observed
 Presentation:            Breech
 Placenta:                Anterior
 P. Cord Insertion:       Visualized, central

 Amniotic Fluid
 AFI FV:      Subjectively low-normal

 AFI Sum(cm)     %Tile       Largest Pocket(cm)
 7.6             5

 RUQ(cm)       RLQ(cm)       LUQ(cm)        LLQ(cm)

Biophysical Evaluation

 Amniotic F.V:   Pocket < 2 cm two          F. Tone:         Observed
                 planes
 F. Movement:    Observed                   Score:           [DATE]
 F. Breathing:   Not Observed
OB History

 Gravidity:    1
Gestational Age

 LMP:           36w 3d        Date:  10/29/18                 EDD:   08/05/19
 Best:          36w 3d     Det. By:  LMP  (10/29/18)          EDD:   08/05/19
Anatomy

 Cranium:               Appears normal         Diaphragm:              Appears normal
 Cavum:                 Appears normal         Stomach:                Appears normal, left
                                                                       sided
 Ventricles:            Appears normal         Abdomen:                Appears normal
 Cerebellum:            Appears normal         Cord Vessels:           Appears normal (3
                                                                       vessel cord)
 Posterior Fossa:       Appears normal         Kidneys:                Appear normal
 RVOT:                  Appears normal         Bladder:                Appears normal
 Aortic Arch:           Appears normal         Spine:                  Ltd views no
                                                                       intracranial signs of
                                                                       NTD
Impression

 Patient was evaluated at the [facility] following the finding of
 oligohydramnios at your office. She also had a BPP score of
 [DATE].

 Amniotic fluid is normal (5th percentile). Breech presentation.
 Fetal breathing movements did not meet the criteria (BPP) .
 BPP [DATE].

                 Lazari, Alicio

## 2021-09-22 ENCOUNTER — Ambulatory Visit: Payer: Self-pay | Admitting: Internal Medicine

## 2021-10-25 ENCOUNTER — Encounter: Payer: Self-pay | Admitting: Internal Medicine

## 2021-10-25 ENCOUNTER — Ambulatory Visit (INDEPENDENT_AMBULATORY_CARE_PROVIDER_SITE_OTHER): Payer: Self-pay | Admitting: Internal Medicine

## 2021-10-25 VITALS — BP 130/72 | HR 65 | Ht 71.0 in | Wt 168.0 lb

## 2021-10-25 DIAGNOSIS — C73 Malignant neoplasm of thyroid gland: Secondary | ICD-10-CM

## 2021-10-25 DIAGNOSIS — E890 Postprocedural hypothyroidism: Secondary | ICD-10-CM

## 2021-10-25 LAB — TSH: TSH: 0.01 u[IU]/mL — ABNORMAL LOW (ref 0.35–5.50)

## 2021-10-25 LAB — T4, FREE: Free T4: 1.9 ng/dL — ABNORMAL HIGH (ref 0.60–1.60)

## 2021-10-25 NOTE — Progress Notes (Signed)
Patient ID: Lauren Morton, female   DOB: 10/20/1985, 36 y.o.   MRN: [id number]  HPI  Lauren Morton is a 36 y.o.-year-old female, returning for f/u for papillary thyroid cancer and postsurgical hypothyroidism. Last visit 6 months ago.  Interim history: Patient gave birth to twins in 10/2020.  They are both healthy.  She stopped breast-feeding.  They are still not sleeping through the night. She also has a healthy daughter born 07/2019 Lauren Morton).  Reviewed her thyroid cancer history: Pt. has been dx with goiter in ~2006 >> thyroid U/S: ? Results but also dx'ed with Hashimoto's thyroiditis with hypothyroidism >> started levothyroxine.  She started to see Dr Danise Mina 03/2014 >> new U/S (04/2014): small R thyroid nodule 1.4 x 1.3 x 1.1 cm, with microcalcifications >>  PTC in 04/2014, by FNA.   06/03/2014: Total thyroidectomy -Dr. Harlow Asa. Path: Diagnosis 1. Thyroid, thyroidectomy, total thyroid, suture right superior pole - PAPILLARY THYROID CARCINOMA, 1.5 CM IN GREATEST DIMENSION. - MARGINS ARE NEGATIVE. - BACKGROUND FLORID LYMPHOCYTIC THYROIDITIS. - SEE ONCOLOGY TEMPLATE. 2. Lymph nodes, regional resection, central compartment - TEN BENIGN LYMPH NODES WITH NO TUMOR SEEN (0/10). Microscopic Comment 1. THYROID Specimen: Thyroid with central compartment lymph nodes. Procedure: Total thyroidectomy with limited central compartment lymph dissection. Specimen Integrity (intact/fragmented): Intact. Tumor focality: Unifocal. Dominant tumor: Maximum tumor size (cm): 1.5 cm. Tumor laterality: Tumor involves right superior pole. Histologic type (including subtype and/or unique features as applicable): Papillary thyroid carcinoma, conventional type. Tumor capsule: Tumor is partially encapsulated. Extrathyroidal extension: No. Margins: Negative. Lymph - Vascular invasion: Definitive lymph/vascular is not identified. Capsular invasion with degree of invasion if present: No capsular  invasion identified. Lymph nodes: # examined 10; # positive 0. TNM code: pT1b, pN0. Non-neoplastic thyroid: [place] lymphocytic thyroiditis. (RH:ds 06/04/14)  Neck U/S 1 year after thyroidectomy (12/2015):  Post total thyroidectomy with minimal amount (approximately 1 cm) of residual nodular tissue within the right thyroid lobectomy resection bed potentially indicative of residual thyroid tissue though residual/locally recurrent disease is not excluded on the basis this examination. This residual tissue could be amenable to ultrasound-guided fine-needle aspiration as clinically indicated.  Neck U/S (12/28/2018): Isthmus: Surgically absent. There is no residual nodular soft tissue within the isthmic resection bed.  Right lobe: Surgically absent. There is fairly well-defined hypoechoic nodular soft tissue measuring approximately 0.5 x 0.4 x 1.0 cm within the right thyroid lobectomy resection bed.  Left lobe: Surgically absent. There is no residual nodular soft tissue within the left lobectomy resection bed.   Benign appearing non pathologically enlarged cervical lymph nodes are seen bilaterally.  Neck U/S (01/03/2018): Redemonstration surgical changes of thyroidectomy. No residual   thyroid tissue identified within the left or right thyroid bed.  Reviewed thyroglobulin levels: Lab Results  Component Value Date   THYROGLB 0.1 (L) 03/22/2021   THYROGLB <0.1 (L) 09/16/2019   THYROGLB 0.3 (L) 06/05/2018   THYROGLB 0.2 (L) 12/12/2017   THYROGLB 0.4 (L) 03/01/2017   THYROGLB 0.3 (L) 01/25/2016   THYROGLB 0.8 (L) 02/18/2015   THYROGLB 0.8 (L) 08/18/2014   Reviewed ATA antibiotics: Lab Results  Component Value Date   THGAB 1 03/22/2021   THGAB 1 09/16/2019   THGAB <1 06/05/2018   THGAB <1 12/12/2017   THGAB <1 03/01/2017   THGAB <1 01/25/2016   THGAB <1 02/18/2015   THGAB 1 08/18/2014   Postsurgical hypothyroidism:  Reviewed and addended history: In 12/2017, she was supposed to  be on levothyroxine 125 mcg daily, however, the pharmacy  refilled the previous prescription for 175 mcg daily and she was taking this for the months prior to our last visit.  Subsequently, TSH was suppressed.  We decreased the dose to 125 mcg daily and her TFTs normalized in 01/2018.  However, then, TSH was suppressed and we decreased the dose of her levothyroxine.  She did not return for repeat labs 1.5 months later...   In 12/2018, her TSH was normal, but above target for pregnancy so we increased her levothyroxine dose to 9 tablets of 112 mcg/week.  However, since then, she had several TSH levels that were much higher, unexplained.  I was not aware of that was done, as she had them checked when she presented to the ED with tachycardia.  On 05/04/2019, TSH was still high at 15.9.  TFTs remained uncontrolled due to missed levothyroxine doses.  We had to increase the levothyroxine dose to 200 mcg daily in 07/2019.  However, we could decrease the dose to 150 mcg daily 09/2019.  However, at last visit, she was again pregnant so we increased the dose to 200 mcg daily.  In 09/2020, close to the end of her twin pregnancy, she was again missing levothyroxine doses and the TSH was very high, at 35.  After starting to take the medication consistently, TSH normalized in 11/2020.  However, in 03/2021, TSH was again very high, at 24.5.  We did not change the dose at that time, but I encouraged her to take levothyroxine every day.  Patient takes the levothyroxine 200 mcg daily: - in am - fasting - at least 30 min from b'fast - no Ca, Fe, stopped prenatal multivitamins in the evening - no PPIs - not on Biotin  We reviewed her TFTs: Lab Results  Component Value Date   TSH 24.52 (H) 03/22/2021   TSH 1.39 11/11/2020   TSH 35.06 (H) 09/20/2020   TSH 0.59 04/13/2020   TSH 1.28 03/15/2020   TSH 0.66 01/11/2020   TSH 0.01 (L) 09/16/2019   TSH 8.64 (H) 07/08/2019   TSH 4.93 (H) 06/08/2019   TSH 15.91  (H) 05/04/2019   FREET4 0.80 03/22/2021   FREET4 1.17 11/11/2020   FREET4 0.55 (L) 09/20/2020   FREET4 1.20 04/13/2020   FREET4 1.10 03/15/2020   FREET4 1.40 01/11/2020   FREET4 1.66 (H) 09/16/2019   FREET4 0.81 07/08/2019   FREET4 0.85 06/08/2019   FREET4 0.75 05/04/2019  01/10/2017: TSH 0.03 at OB/GYN office -we decreased the dose of levothyroxine at that time.  Pt denies: - feeling nodules in neck - hoarseness - dysphagia - choking  She has + FH of thyroid disorders in: MGM - Hashimoto's ds. No FH of thyroid cancer. No h/o radiation tx to head or neck. No herbal supplements. No Biotin use. No recent steroids use.   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  She had SVT ablation in 2022 and her palpitations resolved.  ROS: + See HPI  I reviewed pt's medications, allergies, PMH, social hx, family hx, and changes were documented in the history of present illness. Otherwise, unchanged from my initial visit note.  Past Medical History:  Diagnosis Date   AVNRT (AV nodal re-entry tachycardia) ([facility]) 2021   CKD (chronic kidney disease) stage 1, GFR 90 ml/min or greater 09/2013   mild proteinuria [facility]) sees yearly   Dysrhythmia    SVT during pregnancy   Hashimoto's thyroiditis    confirmed by biopsy s/p thyroidectomy   Hypothyroidism, acquired, autoimmune  Papillary thyroid carcinoma ([place]) 03/31/2014   s/p thyroidectomy, 10 negative LN   Past Surgical History:  Procedure Laterality Date   CESAREAN SECTION N/A 07/15/2019   Procedure: [id number] CESAREAN SECTION;  Surgeon: Everlene Farrier, MD;  Location: [facility];  Service: Obstetrics;  Laterality: N/A;   CESAREAN SECTION MULTI-GESTATIONAL N/A 10/12/2020   Procedure: CESAREAN SECTION MULTI-GESTATIONAL;  Surgeon: Dian Queen, MD;  Location: [facility];  Service: Obstetrics;  Laterality: N/A;   LYMPH NODE DISSECTION N/A 06/03/2014   Procedure: LIMITED LYMPH NODE DISSECTION;  Surgeon: Armandina Gemma, MD;  Location:  [facility];  Service: General;  Laterality: N/A;   SVT ABLATION N/A 02/02/2020   Procedure: SVT ABLATION;  Surgeon: Vickie Epley, MD;  Location: [facility];  Service: Cardiovascular;  Laterality: N/A;   THYROIDECTOMY N/A 06/03/2014   total - 1.5cm papillary thyroid carcinoma with negative surgical margins and 10/10 LN neg for mets; Armandina Gemma, MD   WISDOM TOOTH EXTRACTION     4   Social History   Social History   Marital Status: Married    Spouse Name: N/A   Number of Children: 0   Occupational History   School counselor   Social History Main Topics   Smoking status: Never Smoker    Smokeless tobacco: Never Used   Alcohol Use: Yes     Comment: social use   Drug Use: No   Social History Narrative   Caffeine: 1 soda at lunch   Lives with husband, 2 dogs and 1 pig, chickens   Occupation: Animal nutritionist   Edu: Masters in education   Activity: runs, has done 1/2 marathons   Diet: good water, good fruits/vegetables, red meat 2x/wk, fish 1x/wk   Current Outpatient Medications on File Prior to Visit  Medication Sig Dispense Refill   acetaminophen (TYLENOL) 325 MG tablet Take 2 tablets (650 mg total) by mouth every 4 (four) hours as needed for mild pain (temperature > 101.5.). 60 tablet 0   ibuprofen (ADVIL) 600 MG tablet Take 1 tablet (600 mg total) by mouth every 6 (six) hours as needed. 60 tablet 0   levothyroxine (SYNTHROID) 200 MCG tablet TAKE 1 TABLET (200 MCG TOTAL) BY MOUTH DAILY BEFORE BREAKFAST. 90 tablet 3   Prenatal Vit-Fe Fumarate-FA (MULTIVITAMIN-PRENATAL) 27-0.8 MG TABS tablet Take 1 tablet by mouth daily in the afternoon.     No current facility-administered medications on file prior to visit.   No Known Allergies Family History  Problem Relation Age of Onset   Cancer Mother 73       breast cancer, s/p mastectomy   Diabetes Father    Coronary artery disease Maternal Grandfather        several MIs   Heart disease Maternal Grandfather    Heart failure  Maternal Grandfather    Cancer Maternal Grandmother        breast cancer s/p lumpectomy   Thyroid disease Maternal Grandmother    Alcohol abuse Paternal Grandmother    Stroke Neg Hx    PE: BP 130/72 (BP Location: Left Arm, Patient Position: Sitting, Cuff Size: Normal)   Pulse 65   Ht $R'5\' 11"'Aq$  (1.803 m)   Wt 168 lb (76.2 kg)   SpO2 99%   BMI 23.43 kg/m   Wt Readings from Last 3 Encounters:  10/25/21 168 lb (76.2 kg)  03/22/21 172 lb 3.2 oz (78.1 kg)  10/12/20 210 lb (95.3 kg)   Constitutional: normal weight, in NAD Eyes: EOMI, no exophthalmos ENT:no  neck masses palpable, no cervical lymphadenopathy Cardiovascular: RRR, No MRG Respiratory: CTA B Musculoskeletal: no deformities Skin: moist, warm, no rashes Neurological: no tremor with outstretched hands  ASSESSMENT: 1.  Papillary thyroid cancer (PTC) - see HPI  2. Postsurgical Hypothyroidism  PLAN:  1.  Papillary thyroid cancer -Patient with a history of a 1.5 cm PTC cancer focus, for which she had total thyroidectomy but no RAI treatment due to the tumor size and the risk status.  We are following her with thyroglobulin levels and neck ultrasounds.  A neck ultrasound from 12/2015 showed a 1 cm right neck mass in the thyroid region but the repeat neck ultrasound from 01/2018 did not show any masses in the neck so it was likely that the previous 1 cm mass was just an area of inflammation. -Previous thyroglobulin levels are undetectable but at last visit, TSH was quite high, at 24.5, and thyroglobulin was detectable, and 0.1 -We will repeat these at next visit -She denies neck compression symptoms and no masses are felt on palpation of her neck - Plan to  check a neck U/S at next OV -I will see her back in 6 months  2. Patient with history of total thyroidectomy for papillary thyroid cancer, on levothyroxine therapy, with uncontrolled hypothyroidism -She has a history of missing levothyroxine doses with subsequent abnormal TSH  levels, including a TSH of 35 during her last pregnancy and a TSH of 24 during latest pregnancy.  She gave birth by C-section 10/12/2020-healthy twin girls. -Previously, TFTs were fluctuating, due to incomplete compliance with levothyroxine.  Tests were normal when she was taking levothyroxine consistently - latest thyroid labs reviewed with pt. >> TSH elevated despite not changing her levothyroxine dose: Lab Results  Component Value Date   TSH 24.52 (H) 03/22/2021  - she continues on LT4 200 mcg daily - I advised her to try her best to take this every day - she did not return for repeat labs afterwards... - pt feels good on this dose. - we discussed about taking the thyroid hormone every day, with water, >30 minutes before breakfast, separated by >4 hours from acid reflux medications, calcium, iron, multivitamins. Pt. is taking it correctly.  She mentions that she did not forget doses since last visit. -At today's visit, we discussed about possibly doubling up on the dose if she forgets to take levothyroxine the day before, but if she develops palpitations from this or feels poorly, she may cut the tablet in half and take 1.5 tablets for 2 days of the week after missed dose. -We discussed about the danger of persistently elevated TSH and stimulating growth of thyroid cancer cells - will check thyroid tests today: TSH and fT4 - If labs are abnormal, she will need to return for repeat TFTs in 1.5 months  Needs refills.  Component     Latest Ref Rng 10/25/2021  T4,Free(Direct)     0.60 - 1.60 ng/dL 1.90 (H)   TSH     0.35 - 5.50 uIU/mL 0.01 (L)     After starting to take the levothyroxine more consistently, the dose appears to be excessive.  We will need to back off to 175 mcg and recheck the test in 1.5 months.  Philemon Kingdom, MD PhD [facility] Endocrinology

## 2021-10-25 NOTE — Patient Instructions (Signed)
Please continue Levothyroxine 200 mcg daily.  Take the thyroid hormone every day, with water, at least 30 minutes before breakfast, separated by at least 4 hours from: - acid reflux medications - calcium - iron - multivitamins  Please stop at the lab.  Please come back for a follow-up appointment in 6 months.

## 2021-10-27 MED ORDER — LEVOTHYROXINE SODIUM 175 MCG PO TABS: 175.0000 ug | ORAL_TABLET | Freq: Every day | ORAL | 5 refills | Status: DC

## 2022-01-10 ENCOUNTER — Encounter: Payer: Self-pay | Admitting: Internal Medicine

## 2022-10-08 ENCOUNTER — Ambulatory Visit
Admission: EM | Admit: 2022-10-08 | Discharge: 2022-10-08 | Disposition: A | Discharge status: Home / Self Care | Payer: BC Managed Care – PPO

## 2022-10-08 DIAGNOSIS — L03011 Cellulitis of right finger: Secondary | ICD-10-CM | POA: Diagnosis not present

## 2022-10-08 DIAGNOSIS — R238 Other skin changes: Secondary | ICD-10-CM | POA: Insufficient documentation

## 2022-10-08 DIAGNOSIS — O09529 Supervision of elderly multigravida, unspecified trimester: Secondary | ICD-10-CM | POA: Insufficient documentation

## 2022-10-08 DIAGNOSIS — O30049 Twin pregnancy, dichorionic/diamniotic, unspecified trimester: Secondary | ICD-10-CM | POA: Insufficient documentation

## 2022-10-08 DIAGNOSIS — O3660X0 Maternal care for excessive fetal growth, unspecified trimester, not applicable or unspecified: Secondary | ICD-10-CM | POA: Insufficient documentation

## 2022-10-08 MED ORDER — DOXYCYCLINE HYCLATE 100 MG PO CAPS: 100.0000 mg | ORAL_CAPSULE | Freq: Two times a day (BID) | ORAL | 0 refills | Status: AC

## 2022-10-08 NOTE — ED Triage Notes (Signed)
"  I have had this place on my 4th digit/ring finger on right hand, swelling, redness and drainage (green discharge) starting today". No fever. No injury known.

## 2022-10-08 NOTE — ED Provider Notes (Signed)
[facility]    CSN: [id number] Arrival date & time: 10/08/22  1107      History   Chief Complaint Chief Complaint  Patient presents with   Finger Swelling/Pain    HPI Lauren Morton is a 37 y.o. female.   Patient here today for evaluation of swelling, erythema and drainage from her distal right 4th finger. She noticed swelling a few days ago but drainage started today, She has not had any injury. She has not had fever. She has not tried treatment for symptoms.   The history is provided by the patient.    Past Medical History:  Diagnosis Date   AVNRT (AV nodal re-entry tachycardia) ([facility]) 2021   CKD (chronic kidney disease) stage 1, GFR 90 ml/min or greater 09/2013   mild proteinuria [facility]) sees yearly   Dysrhythmia    SVT during pregnancy   Hashimoto's thyroiditis    confirmed by biopsy s/p thyroidectomy   Hypothyroidism, acquired, autoimmune    Papillary thyroid carcinoma ([facility]) 03/31/2014   s/p thyroidectomy, 10 negative LN    Patient Active Problem List   Diagnosis Date Noted   [place] 10/08/2022   Multigravida of advanced maternal age 63/07/2022   Dichorionic diamniotic twin pregnancy 10/08/2022   Large for gestational age fetus affecting management of mother 10/08/2022   Twins live born in hospital 10/12/2020   IUGR (intrauterine growth restriction) affecting care of mother 10/12/2020   History of cesarean section 10/12/2020   Supraventricular tachycardia ([facility]) 03/08/2020   Chronic kidney disease, stage 1 09/21/2019   Breech presentation 07/15/2019   Acquired hypothyroidism 08/18/2014   Health maintenance examination 03/31/2014   Papillary thyroid carcinoma ([facility]) 03/31/2014   CKD (chronic kidney disease) stage 1, GFR 90 ml/min or greater 09/01/2013   History of Hashimoto thyroiditis 03/24/2013    Past Surgical History:  Procedure Laterality Date   CESAREAN SECTION N/A 07/15/2019   Procedure: [id number] CESAREAN SECTION;  Surgeon:  Harold Hedge, MD;  Location: [facility];  Service: Obstetrics;  Laterality: N/A;   CESAREAN SECTION MULTI-GESTATIONAL N/A 10/12/2020   Procedure: CESAREAN SECTION MULTI-GESTATIONAL;  Surgeon: Marcelle Overlie, MD;  Location: [facility];  Service: Obstetrics;  Laterality: N/A;   LYMPH NODE DISSECTION N/A 06/03/2014   Procedure: LIMITED LYMPH NODE DISSECTION;  Surgeon: Darnell Level, MD;  Location: [facility];  Service: General;  Laterality: N/A;   SVT ABLATION N/A 02/02/2020   Procedure: SVT ABLATION;  Surgeon: Lanier Prude, MD;  Location: [facility];  Service: Cardiovascular;  Laterality: N/A;   THYROIDECTOMY N/A 06/03/2014   total - 1.5cm papillary thyroid carcinoma with negative surgical margins and 10/10 LN neg for mets; Darnell Level, MD   WISDOM TOOTH EXTRACTION     4    OB History     Gravida  2   Para  2   Term  2   Preterm      AB      Living  3      SAB      IAB      Ectopic      Multiple  1   Live Births  3            Home Medications    Prior to Admission medications   Medication Sig Start Date End Date Taking? Authorizing Provider  doxycycline (VIBRAMYCIN) 100 MG capsule Take 1 capsule (100 mg total) by mouth 2 (two) times daily. 10/08/22  Yes Tomi Bamberger, PA-C  hydrocortisone-pramoxine (ANALPRAM HC) 2.5-1 % rectal cream Place 1 Application rectally 4 (four) times daily. 07/05/20  Yes [provider]  levonorgestrel (MIRENA, 52 MG,) 20 MCG/DAY IUD 1 each by Intrauterine route once. 08/14/21  Yes [provider]  levothyroxine (SYNTHROID) 175 MCG tablet Take 1 tablet (175 mcg total) by mouth daily. 10/27/21  Yes Carlus Pavlov, MD  acetaminophen (TYLENOL) 325 MG tablet Take 2 tablets (650 mg total) by mouth every 4 (four) hours as needed for mild pain (temperature > 101.5.). 10/15/20   Harold Hedge, MD  ibuprofen (ADVIL) 600 MG tablet Take 1 tablet (600 mg total) by mouth every 6 (six) hours as needed. 10/15/20   Harold Hedge,  MD  Prenatal Vit-DSS-Fe Cbn-FA (PRENATAL AD PO) Prenatal + DHA  1 TABLET PO DAILY    [provider]  Prenatal Vit-Fe Fumarate-FA (MULTIVITAMIN-PRENATAL) 27-0.8 MG TABS tablet Take 1 tablet by mouth daily in the afternoon.    [provider]    Family History Family History  Problem Relation Age of Onset   Cancer Mother 87       breast cancer, s/p mastectomy   Diabetes Father    Coronary artery disease Maternal Grandfather        several MIs   Heart disease Maternal Grandfather    Heart failure Maternal Grandfather    Cancer Maternal Grandmother        breast cancer s/p lumpectomy   Thyroid disease Maternal Grandmother    Alcohol abuse Paternal Grandmother    Stroke Neg Hx     Social History Social History   Tobacco Use   Smoking status: Never   Smokeless tobacco: Never  Vaping Use   Vaping status: Never Used  Substance Use Topics   Alcohol use: Not Currently   Drug use: Never     Allergies   Patient has no known allergies.   Review of Systems Review of Systems  Constitutional:  Negative for chills and fever.  Eyes:  Negative for discharge and redness.  Respiratory:  Negative for shortness of breath.   Gastrointestinal:  Negative for abdominal pain, nausea and vomiting.  Musculoskeletal:  Negative for joint swelling.  Skin:  Positive for color change.  Neurological:  Negative for numbness.     Physical Exam Triage Vital Signs ED Triage Vitals  Encounter Vitals Group     BP      Systolic BP Percentile      Diastolic BP Percentile      Pulse      Resp      Temp      Temp src      SpO2      Weight      Height      Head Circumference      Peak Flow      Pain Score      Pain Loc      Pain Education      Exclude from Growth Chart    No data found.  Updated Vital Signs BP 113/73 (BP Location: Left Arm)   Pulse 62   Temp 98.4 F (36.9 C) (Oral)   Resp 16   Ht 5\' 11"  (1.803 m)   Wt 165 lb (74.8 kg)   LMP  (LMP Unknown)    SpO2 97%   BMI 23.01 kg/m   Physical Exam Vitals and nursing note reviewed.  Constitutional:      General: She is not in acute distress.    Appearance: Normal appearance.  She is not ill-appearing.  HENT:     Head: Normocephalic and atraumatic.  Eyes:     Conjunctiva/sclera: Conjunctivae normal.  Cardiovascular:     Rate and Rhythm: Normal rate.  Pulmonary:     Effort: Pulmonary effort is normal. No respiratory distress.  Skin:    Comments: Diffuse swelling and erythema around cuticle of right 4th finger with minimal purulent drainage noted  Neurological:     Mental Status: She is alert.  Psychiatric:        Mood and Affect: Mood normal.        Behavior: Behavior normal.        Thought Content: Thought content normal.      UC Treatments / Results  Labs (all labs ordered are listed, but only abnormal results are displayed) Labs Reviewed - No data to display  EKG   Radiology No results found.  Procedures Procedures (including critical care time)  Medications Ordered in UC Medications - No data to display  Initial Impression / Assessment and Plan / UC Course  I have reviewed the triage vital signs and the nursing notes.  Pertinent labs & imaging results that were available during my care of the patient were reviewed by me and considered in my medical decision making (see chart for details).  Symptoms consistent with paronychia- no indication for incision and drainage given spontaneous drainage. Advised warm soaks to promote continued drainage and doxycyline prescribed. Encouraged follow up if no gradual improvement or with any further concerns.    Final Clinical Impressions(s) / UC Diagnoses   Final diagnoses:  Paronychia of finger of right hand   Discharge Instructions   None    ED Prescriptions     Medication Sig Dispense Auth. Provider   doxycycline (VIBRAMYCIN) 100 MG capsule Take 1 capsule (100 mg total) by mouth 2 (two) times daily. 20 capsule  Tomi Bamberger, PA-C      PDMP not reviewed this encounter.   Tomi Bamberger, PA-C 10/15/22 [id number]

## 2022-10-10 IMAGING — US US MFM FETAL BPP W/O NON-STRESS
1 series · 13 of 28 positions shown · non-contrast
Comparison: none

[Series 1: us mfm fetal bpp w/o non-stress · 50 acquisitions, 13 frames shown]
[im 2/50]
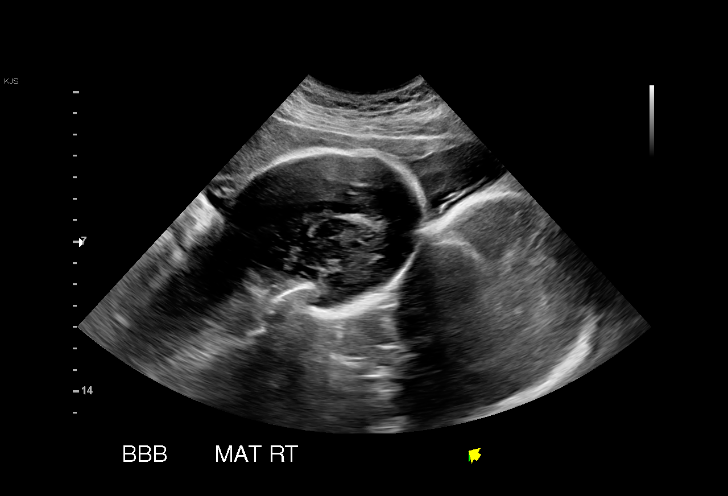
[im 6/50]
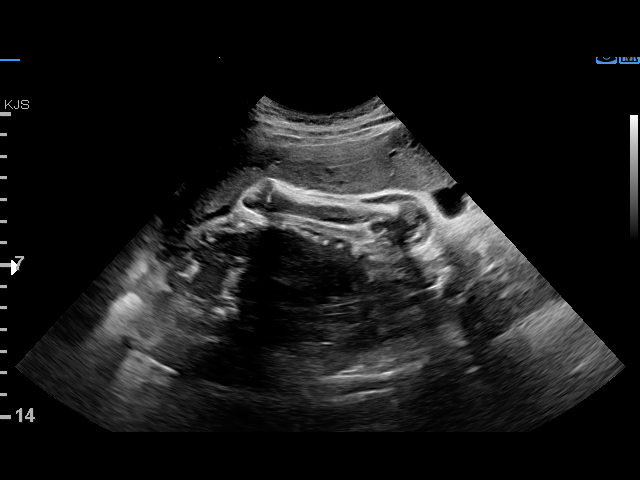
[im 10/50]
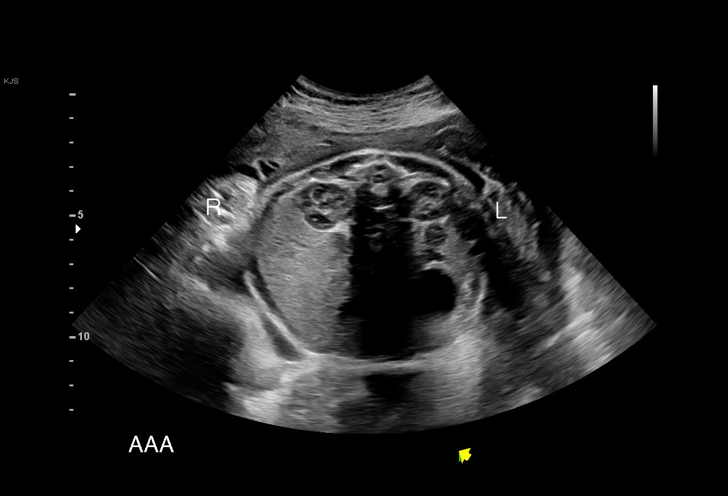
[im 13/50]
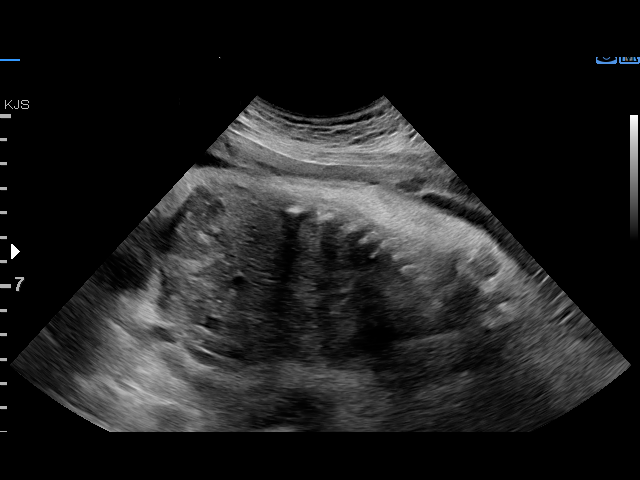
[im 17/50]
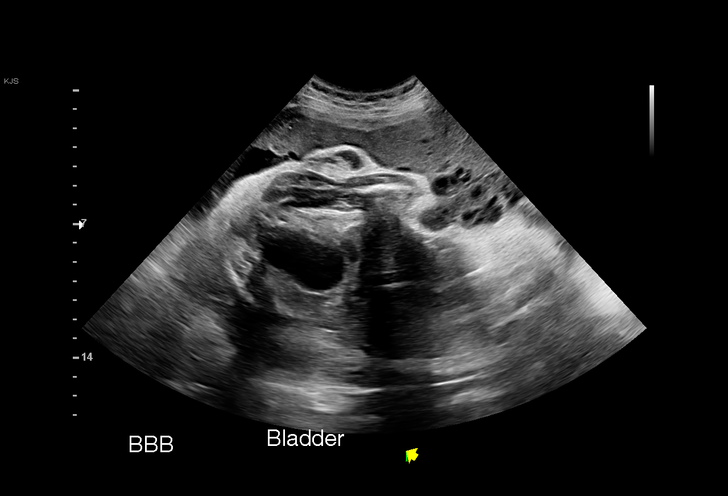
[im 20/50]
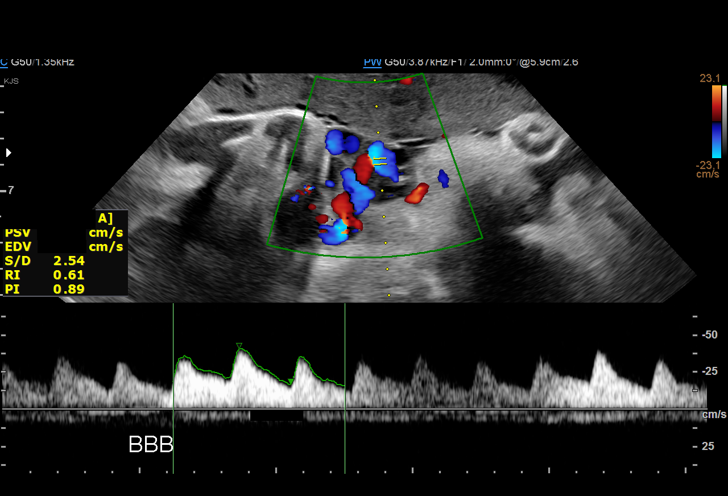
[im 26/50]
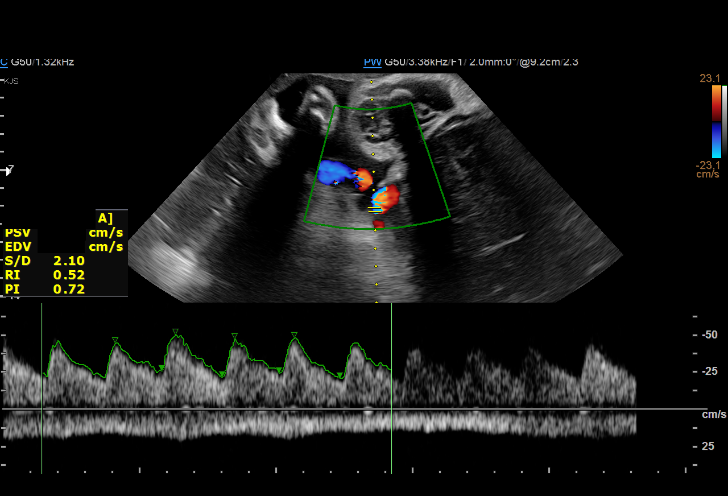
[im 30/50]
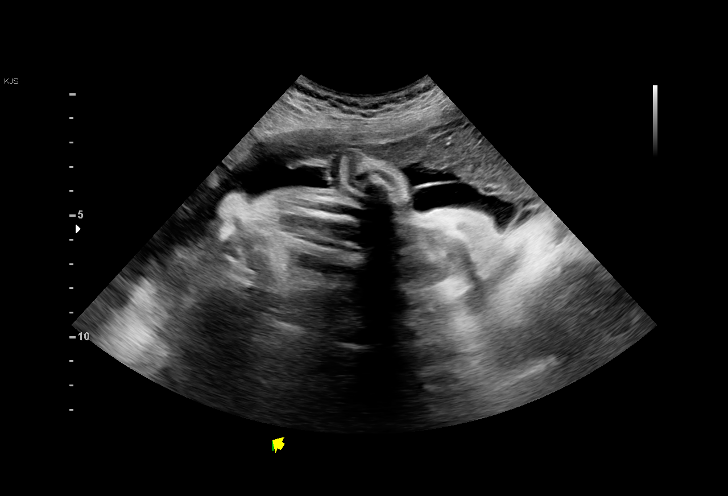
[im 33/50]
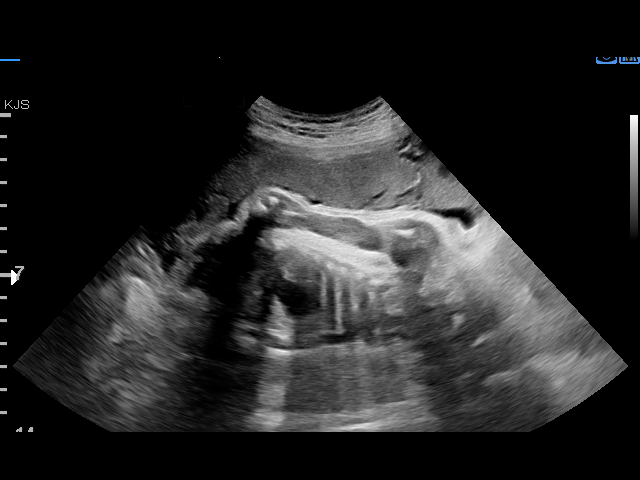
[im 37/50]
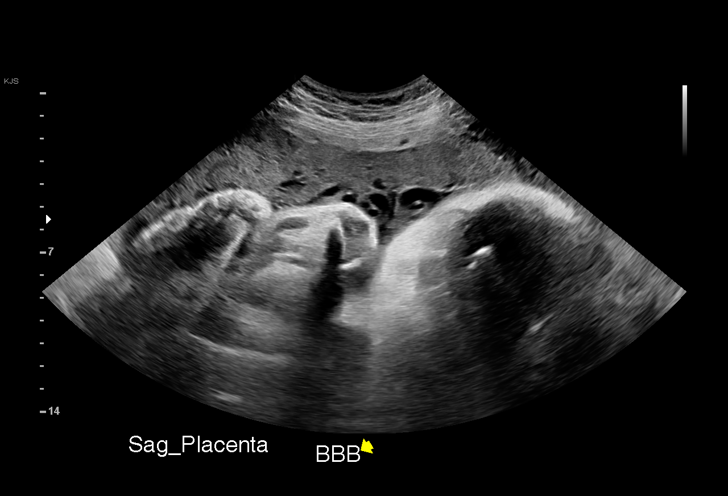
[im 40/50]
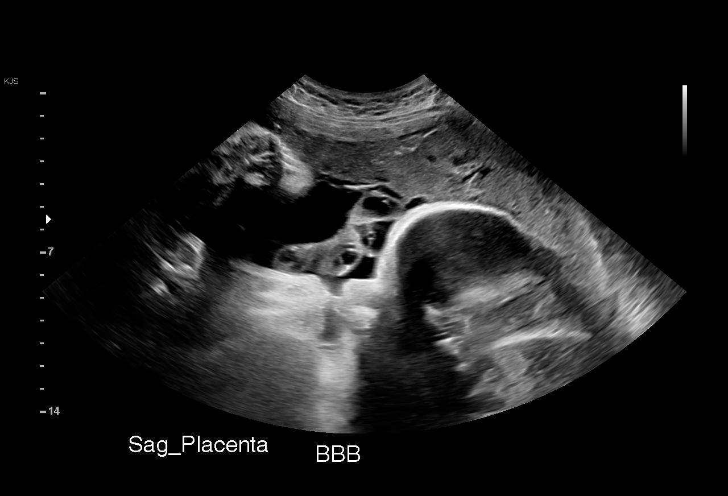
[im 44/50]
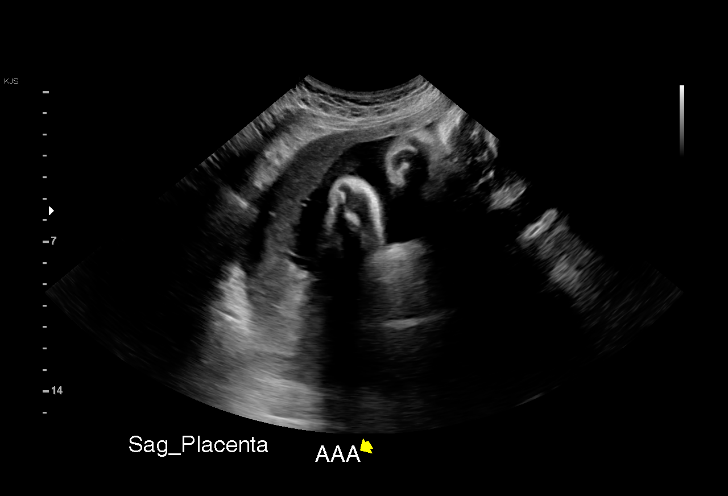
[im 48/50]
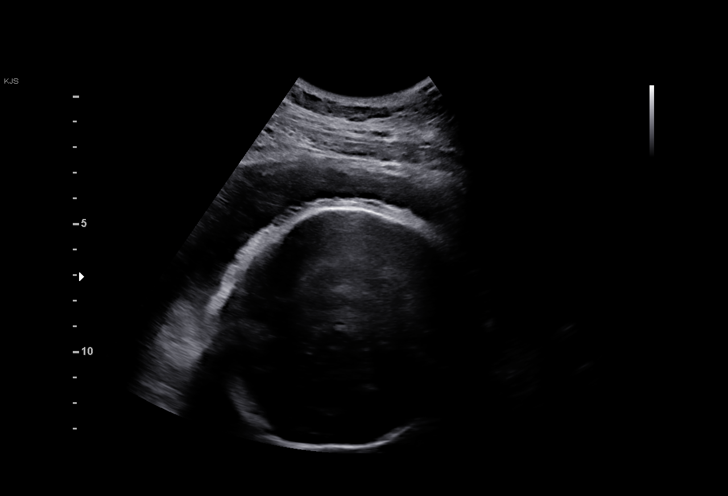

[13 of 28 positions shown; findings below may reference images not displayed]

[REDACTED]. [HOSPITAL]
                   DO

    ADDL GESTATION

Indications

 Advanced maternal age multigravida 35+,
 third trimester
 Twin pregnancy, di/di, third trimester
 History of cesarean delivery, currently
 pregnant (Repeat scheduled for [DATE])
 Medical complication of pregnancy (SVY-
 AVNRT with palpitations  S/P SVT ablation)
 Hypothyroid (Papillary thyroid cancer-post
 surgical)
 Medical complication of pregnancy (CKD
 stage 1 Dx'd in 6702)
 Maternal care for known or suspected poor
 fetal growth, third trimester, fetus 2 IUGR
 37 weeks gestation of pregnancy
 LR NIPS
Fetal Evaluation (Fetus A)

 Num Of Fetuses:         2
 Fetal Heart Rate(bpm):  129
 Cardiac Activity:       Observed
 Fetal Lie:              Maternal left side
 Presentation:           Cephalic
 Placenta:               Posterior Fundal
 P. Cord Insertion:      Not well visualized
 Membrane Desc:      Dividing Membrane seen - Dichorionic.

 Amniotic Fluid
 AFI FV:      Within normal limits

                             Largest Pocket(cm)

Biophysical Evaluation (Fetus A)

 Amniotic F.V:   Pocket => 2 cm             F. Tone:        Observed
 F. Movement:    Observed                   Score:          [DATE]
 F. Breathing:   Observed
OB History

 Gravidity:    2         Term:   1
 Living:       1
Gestational Age (Fetus A)

 LMP:           37w 0d        Date:  01/27/20                 EDD:   11/02/20
 Best:          37w 0d     Det. By:  LMP  (01/27/20)          EDD:   11/02/20
Anatomy (Fetus A)

 Diaphragm:             Appears normal         Kidneys:                Appear normal
 Stomach:               Appears normal, left   Bladder:                Appears normal
                        sided
Doppler - Fetal Vessels (Fetus A)

 Umbilical Artery
  S/D     %tile      RI    %tile      PI    %tile            ADFV    RDFV
     2       25     0.5       22    0.69       25               No      No

Fetal Evaluation (Fetus B)

 Num Of Fetuses:         2
 Fetal Heart Rate(bpm):  148
 Cardiac Activity:       Observed
 Fetal Lie:              Maternal right side
 Presentation:           Cephalic
 Placenta:               Anterior
 P. Cord Insertion:      Not well visualized
 Membrane Desc:      Dividing Membrane seen - Dichorionic.

 Amniotic Fluid
 AFI FV:      Subjectively decreased

                             Largest Pocket(cm)

Biophysical Evaluation (Fetus B)
 Amniotic F.V:   Pocket < 2 cm two          F. Tone:        Not Observed
                 planes
 F. Movement:    Observed                   Score:          [DATE]
 F. Breathing:   Observed
Gestational Age (Fetus B)

 LMP:           37w 0d        Date:  01/27/20                 EDD:   11/02/20
 Best:          37w 0d     Det. By:  LMP  (01/27/20)          EDD:   11/02/20
Doppler - Fetal Vessels (Fetus B)

 Umbilical Artery
  S/D     %tile      RI    %tile      PI    %tile            ADFV    RDFV
  1.89       19    0.47       13    0.64       16               No      No

Comments

 Reind Tolen was seen for a biophysical profile due to a
 dichorionic, diamniotic twin gestation where IUGR of twin B
 was noted on her last exam.  She reports feeling fetal
 movements of both fetuses throughout the day.  She does
 report possible leakage of fluid about 3 days ago.
 A biophysical profile performed today was [DATE] for twin
 A biophysical profile performed today was [DATE] for twin
 B.  Twin B received a -2 for lack of fetal tone.
 Oligohydramnios is also noted in twin B with a maximal
 vertical pocket of less than 2 cm.
 Doppler studies of the umbilical arteries performed for both
 twin A and twin B continues to show normal forward flow.
 There were no signs of absent or reversed end-diastolic flow
 in either fetus.
 The patient was scheduled for a repeat cesarean delivery in 4
 days.  However, due to oligohydramnios noted in twin B,
 delivery is recommended today.
 The patient was sent to the hospital for admission and
 delivery following today's ultrasound exam.  As she last ate at
 around 8 AM today, her cesarean delivery will be performed
 later this afternoon.

## 2022-12-02 ENCOUNTER — Other Ambulatory Visit: Payer: Self-pay | Admitting: Internal Medicine

## 2023-01-28 ENCOUNTER — Other Ambulatory Visit: Payer: Self-pay | Admitting: Internal Medicine

## 2023-01-29 ENCOUNTER — Encounter: Payer: Self-pay | Admitting: Internal Medicine

## 2023-01-29 ENCOUNTER — Ambulatory Visit (INDEPENDENT_AMBULATORY_CARE_PROVIDER_SITE_OTHER): Payer: Self-pay | Admitting: Internal Medicine

## 2023-01-29 VITALS — BP 118/64 | HR 74 | Ht 71.0 in | Wt 173.8 lb

## 2023-01-29 DIAGNOSIS — C73 Malignant neoplasm of thyroid gland: Secondary | ICD-10-CM

## 2023-01-29 DIAGNOSIS — E890 Postprocedural hypothyroidism: Secondary | ICD-10-CM

## 2023-01-29 NOTE — Patient Instructions (Signed)
Please continue Levothyroxine 175 mcg daily.  Take the thyroid hormone every day, with water, at least 30 minutes before breakfast, separated by at least 4 hours from: - acid reflux medications - calcium - iron - multivitamins  Please stop at the lab.  Please come back for a follow-up appointment in 1 year.

## 2023-01-29 NOTE — Progress Notes (Addendum)
Patient ID: Lauren Morton, female   DOB: 19-Nov-1985, 38 y.o.   MRN: [id number]  HPI  Lauren Morton is a 38 y.o.-year-old female, returning for f/u for papillary thyroid cancer and postsurgical hypothyroidism. Last visit 1 year and 3 months ago.  Interim history: Patient gave birth to twins in 10/2020.  She also has a healthy daughter born 07/2019 Lauren Morton).  She is very busy with, but gets help from her mother and mother-in-law. She is feeling well, without complaints today.  Reviewed her thyroid cancer history: Pt. has been dx with goiter in ~2006 >> thyroid U/S: ? Results but also dx'ed with Hashimoto's thyroiditis with hypothyroidism >> started levothyroxine.  She started to see Dr Sharen Hones 03/2014 >> new U/S (04/2014): small R thyroid nodule 1.4 x 1.3 x 1.1 cm, with microcalcifications >>  PTC in 04/2014, by FNA.   06/03/2014: Total thyroidectomy -Dr. Gerrit Friends. Path: Diagnosis 1. Thyroid, thyroidectomy, total thyroid, suture right superior pole - PAPILLARY THYROID CARCINOMA, 1.5 CM IN GREATEST DIMENSION. - MARGINS ARE NEGATIVE. - BACKGROUND FLORID LYMPHOCYTIC THYROIDITIS. - SEE ONCOLOGY TEMPLATE. 2. Lymph nodes, regional resection, central compartment - TEN BENIGN LYMPH NODES WITH NO TUMOR SEEN (0/10). Microscopic Comment 1. THYROID Specimen: Thyroid with central compartment lymph nodes. Procedure: Total thyroidectomy with limited central compartment lymph dissection. Specimen Integrity (intact/fragmented): Intact. Tumor focality: Unifocal. Dominant tumor: Maximum tumor size (cm): 1.5 cm. Tumor laterality: Tumor involves right superior pole. Histologic type (including subtype and/or unique features as applicable): Papillary thyroid carcinoma, conventional type. Tumor capsule: Tumor is partially encapsulated. Extrathyroidal extension: No. Margins: Negative. Lymph - Vascular invasion: Definitive lymph/vascular is not identified. Capsular invasion with degree of  invasion if present: No capsular invasion identified. Lymph nodes: # examined 10; # positive 0. TNM code: pT1b, pN0. Non-neoplastic thyroid: Florid lymphocytic thyroiditis. (RH:ds 06/04/14)  Neck U/S 1 year after thyroidectomy (12/2015):  Post total thyroidectomy with minimal amount (approximately 1 cm) of residual nodular tissue within the right thyroid lobectomy resection bed potentially indicative of residual thyroid tissue though residual/locally recurrent disease is not excluded on the basis this examination. This residual tissue could be amenable to ultrasound-guided fine-needle aspiration as clinically indicated.  Neck U/S (12/28/2015): Isthmus: Surgically absent. There is no residual nodular soft tissue within the isthmic resection bed.  Right lobe: Surgically absent. There is fairly well-defined hypoechoic nodular soft tissue measuring approximately 0.5 x 0.4 x 1.0 cm within the right thyroid lobectomy resection bed.  Left lobe: Surgically absent. There is no residual nodular soft tissue within the left lobectomy resection bed.   Benign appearing non pathologically enlarged cervical lymph nodes are seen bilaterally.  Neck U/S (01/03/2018): Redemonstration surgical changes of thyroidectomy. No residual   thyroid tissue identified within the left or right thyroid bed.  Reviewed thyroglobulin levels: Lab Results  Component Value Date   THYROGLB 0.1 (L) 03/22/2021   THYROGLB <0.1 (L) 09/16/2019   THYROGLB 0.3 (L) 06/05/2018   THYROGLB 0.2 (L) 12/12/2017   THYROGLB 0.4 (L) 03/01/2017   THYROGLB 0.3 (L) 01/25/2016   THYROGLB 0.8 (L) 02/18/2015   THYROGLB 0.8 (L) 08/18/2014   Reviewed ATA antibiotics: Lab Results  Component Value Date   THGAB 1 03/22/2021   THGAB 1 09/16/2019   THGAB <1 06/05/2018   THGAB <1 12/12/2017   THGAB <1 03/01/2017   THGAB <1 01/25/2016   THGAB <1 02/18/2015   THGAB 1 08/18/2014   Postsurgical hypothyroidism:  Reviewed and addended  history: In 12/2017, she was supposed to be on  levothyroxine 125 mcg daily, however, the pharmacy refilled the previous prescription for 175 mcg daily and she was taking this for the months prior to our last visit.  Subsequently, TSH was suppressed.  We decreased the dose to 125 mcg daily and her TFTs normalized in 01/2018.  However, then, TSH was suppressed and we decreased the dose of her levothyroxine.  She did not return for repeat labs 1.5 months later...   In 12/2018, her TSH was normal, but above target for pregnancy so we increased her levothyroxine dose to 9 tablets of 112 mcg/week.  However, since then, she had several TSH levels that were much higher, unexplained.  I was not aware of that was done, as she had them checked when she presented to the ED with tachycardia.  On 05/04/2019, TSH was still high at 15.9.  TFTs remained uncontrolled due to missed levothyroxine doses.  We had to increase the levothyroxine dose to 200 mcg daily in 07/2019.  However, we could decrease the dose to 150 mcg daily 09/2019.  However, at last visit, she was again pregnant so we increased the dose to 200 mcg daily.  In 09/2020, close to the end of her twin pregnancy, she was again missing levothyroxine doses and the TSH was very high, at 35.  After starting to take the medication consistently, TSH normalized in 11/2020.  However, in 03/2021, TSH was again very high, at 24.5.  We did not change the dose at that time, but I encouraged her to take levothyroxine every day.  In 10/2021, TSH was significantly suppressed, so we decreased the LT4 dose.  Patient takes the levothyroxine 175 mcg daily (decreased from 200 mcg 10/2021): - maybe missed 1-2 tablets - in am - fasting - at least 30 min from b'fast - no Ca, Fe, stopped prenatal multivitamins in the evening - no PPIs - not on Biotin  We reviewed her TFTs: 01/09/2022: TSH 0.297 Lab Results  Component Value Date   TSH 0.01 (L) 10/25/2021   TSH 24.52  (H) 03/22/2021   TSH 1.39 11/11/2020   TSH 35.06 (H) 09/20/2020   TSH 0.59 04/13/2020   TSH 1.28 03/15/2020   TSH 0.66 01/11/2020   TSH 0.01 (L) 09/16/2019   TSH 8.64 (H) 07/08/2019   TSH 4.93 (H) 06/08/2019   FREET4 1.90 (H) 10/25/2021   FREET4 0.80 03/22/2021   FREET4 1.17 11/11/2020   FREET4 0.55 (L) 09/20/2020   FREET4 1.20 04/13/2020   FREET4 1.10 03/15/2020   FREET4 1.40 01/11/2020   FREET4 1.66 (H) 09/16/2019   FREET4 0.81 07/08/2019   FREET4 0.85 06/08/2019  01/10/2017: TSH 0.03 at OB/GYN office -we decreased the dose of levothyroxine at that time.  Pt denies: - feeling nodules in neck - hoarseness - dysphagia - choking  She has + FH of thyroid disorders in: MGM - Hashimoto's ds. No FH of thyroid cancer. No h/o radiation tx to head or neck. No herbal supplements. No Biotin use. No recent steroids use.   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  She had SVT ablation in 2022 and her palpitations resolved.  ROS: + See HPI  I reviewed pt's medications, allergies, PMH, social hx, family hx, and changes were documented in the history of present illness. Otherwise, unchanged from my initial visit note.  Past Medical History:  Diagnosis Date   AVNRT (AV nodal re-entry tachycardia) (HCC) 2021   CKD (chronic kidney disease) stage 1, GFR 90 ml/min or greater 09/2013  mild proteinuria [facility]) sees yearly   Dysrhythmia    SVT during pregnancy   Hashimoto's thyroiditis    confirmed by biopsy s/p thyroidectomy   Hypothyroidism, acquired, autoimmune    Papillary thyroid carcinoma (HCC) 03/31/2014   s/p thyroidectomy, 10 negative LN   Past Surgical History:  Procedure Laterality Date   CESAREAN SECTION N/A 07/15/2019   Procedure: [id number] CESAREAN SECTION;  Surgeon: Harold Hedge, MD;  Location: [facility];  Service: Obstetrics;  Laterality: N/A;   CESAREAN SECTION MULTI-GESTATIONAL N/A 10/12/2020   Procedure: CESAREAN SECTION MULTI-GESTATIONAL;  Surgeon:  Marcelle Overlie, MD;  Location: [facility];  Service: Obstetrics;  Laterality: N/A;   LYMPH NODE DISSECTION N/A 06/03/2014   Procedure: LIMITED LYMPH NODE DISSECTION;  Surgeon: Darnell Level, MD;  Location: [facility]S;  Service: General;  Laterality: N/A;   SVT ABLATION N/A 02/02/2020   Procedure: SVT ABLATION;  Surgeon: Lanier Prude, MD;  Location: [facility] INVASIVE CV LAB;  Service: Cardiovascular;  Laterality: N/A;   THYROIDECTOMY N/A 06/03/2014   total - 1.5cm papillary thyroid carcinoma with negative surgical margins and 10/10 LN neg for mets; Darnell Level, MD   WISDOM TOOTH EXTRACTION     4   Social History   Social History   Marital Status: Married    Spouse Name: N/A   Number of Children: 0   Occupational History   School counselor   Social History Main Topics   Smoking status: Never Smoker    Smokeless tobacco: Never Used   Alcohol Use: Yes     Comment: social use   Drug Use: No   Social History Narrative   Caffeine: 1 soda at lunch   Lives with husband, 2 dogs and 1 pig, chickens   Occupation: Clinical biochemist   Edu: Masters in education   Activity: runs, has done 1/2 marathons   Diet: good water, good fruits/vegetables, red meat 2x/wk, fish 1x/wk   Current Outpatient Medications on File Prior to Visit  Medication Sig Dispense Refill   acetaminophen (TYLENOL) 325 MG tablet Take 2 tablets (650 mg total) by mouth every 4 (four) hours as needed for mild pain (temperature > 101.5.). 60 tablet 0   doxycycline (VIBRAMYCIN) 100 MG capsule Take 1 capsule (100 mg total) by mouth 2 (two) times daily. 20 capsule 0   hydrocortisone-pramoxine (ANALPRAM HC) 2.5-1 % rectal cream Place 1 Application rectally 4 (four) times daily.     ibuprofen (ADVIL) 600 MG tablet Take 1 tablet (600 mg total) by mouth every 6 (six) hours as needed. 60 tablet 0   levonorgestrel (MIRENA, 52 MG,) 20 MCG/DAY IUD 1 each by Intrauterine route once.     levothyroxine (SYNTHROID) 175 MCG tablet Take 1 tablet (175 mcg  total) by mouth daily. 45 tablet 5   Prenatal Vit-DSS-Fe Cbn-FA (PRENATAL AD PO) Prenatal + DHA  1 TABLET PO DAILY     Prenatal Vit-Fe Fumarate-FA (MULTIVITAMIN-PRENATAL) 27-0.8 MG TABS tablet Take 1 tablet by mouth daily in the afternoon.     No current facility-administered medications on file prior to visit.   No Known Allergies Family History  Problem Relation Age of Onset   Cancer Mother 40       breast cancer, s/p mastectomy   Diabetes Father    Coronary artery disease Maternal Grandfather        several MIs   Heart disease Maternal Grandfather    Heart failure Maternal Grandfather    Cancer Maternal Grandmother  breast cancer s/p lumpectomy   Thyroid disease Maternal Grandmother    Alcohol abuse Paternal Grandmother    Stroke Neg Hx    PE: BP 118/64   Pulse 74   Ht 5\' 11"  (1.803 m)   Wt 173 lb 12.8 oz (78.8 kg)   SpO2 99%   BMI 24.24 kg/m   Wt Readings from Last 3 Encounters:  01/29/23 173 lb 12.8 oz (78.8 kg)  10/08/22 165 lb (74.8 kg)  10/25/21 168 lb (76.2 kg)   Constitutional: normal weight, in NAD Eyes: EOMI, no exophthalmos ENT:no neck masses palpable, no cervical lymphadenopathy Cardiovascular: RRR, No MRG Respiratory: CTA B Musculoskeletal: no deformities Skin:  no rashes Neurological: no tremor with outstretched hands  ASSESSMENT: 1.  Papillary thyroid cancer (PTC) - see HPI  2. Postsurgical Hypothyroidism  PLAN:  1.  Papillary thyroid cancer -Patient with a history of a 1.5 mm PTC cancer focus, for which she had total thyroidectomy but no RAI treatment due to the tumor size in the low risk status.  We are following her with thyroglobulin levels and neck ultrasounds.  -Her neck ultrasound from 12/2015 showed a 1 cm right neck mass in the thyroid region but the repeat neck ultrasound from 01/20/2018 did not show any masses in the neck so likely the 1 cm mass which is an area of inflammation -Previous thyroglobulin levels were stable, and  improving, until 2021, when thyroglobulin was undetectable.  Another  level obtained in 2023 was very low, at 0.1, but at that time TSH was quite high, at 24.5 -At today's visit we will recheck Tg + ATA -She denies neck compression symptoms and she has no masses felt on palpation of her neck -Will check another neck ultrasound now -I will see her back in 1 year  2. Patient with history of total thyroidectomy for papillary thyroid cancer, on levothyroxine therapy, with uncontrolled hypothyroidism -She has a history of missing levothyroxine doses with subsequently abnormal TSH levels, including a TSH of 35 and 24 during her pregnancies.  We did discuss about possible negative outcomes during pregnancies and also on the thyroid cancer by stimulating growth of thyroid cancer cells.  She did start to take LT4 consistently afterwards. -At last visit, her TSH was suppressed and we reduced the dose of LT4. -Latest TSH available for review is from 01/09/2022: Slightly low, at 0.97, but improved. - she continues on LT4 175 mcg daily - I advised her to try her best to take this every day -She feels good on this dose - we discussed about taking the thyroid hormone every day, with water, >30 minutes before breakfast, separated by >4 hours from acid reflux medications, calcium, iron, multivitamins. Pt. is taking it correctly, but did miss 1-2 doses in the last 1.5 months. - will check thyroid tests today: TSH and fT4 - If labs are abnormal, she will need to return for repeat TFTs in 1.5 months - OTW, RTC in 1 year  Needs refills ASAP.  Orders Placed This Encounter  Procedures   US THYROID   [name] antibody   [name] Level   TSH   T4, free   Component     Latest Ref Rng 01/29/2023  T4,Free(Direct)     0.8 - 1.8 ng/dL 0.8   TSH     mIU/L 6.23 (H)   TSH is elevated, consistent with missed levothyroxine doses.  I will refill the 175 mcg dose and have the patient return for labs in 1.5  months.  Component     Latest Ref Rng 01/29/2023  [name]     ng/mL 0.2 (L)   Comment --   [name] Ab     < or = 1 IU/mL <1   [name] is only slightly higher than before, most likely due to the increased TSH.  ATA negative.  Neck U/S (01/30/2023): Isthmus: Surgically absent. There is no residual nodular soft tissue within the isthmic resection bed.   Right lobe: Surgically absent. There is no residual nodular soft tissue within the right lobectomy resection bed.   Left lobe: Surgically absent. There is no residual nodular soft tissue within the left lobectomy resection bed.   _________________________________________________________   No regional cervical lymphadenopathy.   IMPRESSION: Post total thyroidectomy without evidence of residual or locally recurrent disease.  Carlus Pavlov, MD PhD [facility] Endocrinology

## 2023-01-30 ENCOUNTER — Other Ambulatory Visit: Payer: 59

## 2023-01-30 ENCOUNTER — Encounter: Payer: Self-pay | Admitting: Internal Medicine

## 2023-01-30 ENCOUNTER — Ambulatory Visit
Admission: RE | Admit: 2023-01-30 | Discharge: 2023-01-30 | Disposition: A | Discharge status: Home / Self Care | Payer: 59 | Source: Ambulatory Visit | Attending: Internal Medicine | Admitting: Internal Medicine

## 2023-01-30 DIAGNOSIS — C73 Malignant neoplasm of thyroid gland: Secondary | ICD-10-CM

## 2023-01-30 MED ORDER — LEVOTHYROXINE SODIUM 175 MCG PO TABS: 175.0000 ug | ORAL_TABLET | Freq: Every day | ORAL | 5 refills | Status: DC

## 2023-01-30 NOTE — Addendum Note (Signed)
Addended by: Carlus Pavlov on: 01/30/2023 08:22 AM   Modules accepted: Orders

## 2023-01-31 LAB — TSH: TSH: 7.5 m[IU]/L — ABNORMAL HIGH

## 2023-01-31 LAB — T4, FREE: Free T4: 0.8 ng/dL (ref 0.8–1.8)

## 2023-01-31 LAB — THYROGLOBULIN ANTIBODY: Thyroglobulin Ab: <1 [IU]/mL (ref <=1)

## 2023-01-31 LAB — THYROGLOBULIN LEVEL: Thyroglobulin: 0.2 ng/mL — ABNORMAL LOW

## 2023-02-05 ENCOUNTER — Encounter: Payer: Self-pay | Admitting: Internal Medicine

## 2023-02-05 LAB — LAB REPORT - SCANNED
Free T4: 8.6 ng/dL
TSH: 14.1 — AB (ref 0.41–5.90)

## 2023-02-07 ENCOUNTER — Encounter: Payer: Self-pay | Admitting: Internal Medicine

## 2023-11-08 ENCOUNTER — Other Ambulatory Visit: Payer: Self-pay | Admitting: Internal Medicine

## 2023-12-12 ENCOUNTER — Other Ambulatory Visit: Payer: Self-pay | Admitting: Obstetrics and Gynecology

## 2023-12-12 DIAGNOSIS — Z803 Family history of malignant neoplasm of breast: Secondary | ICD-10-CM
